# Patient Record
Sex: Female | Born: 1940 | Race: White | Hispanic: No | Marital: Married | Smoking: Former smoker
Health system: Southern US, Community
[De-identification: ages and names within clinical notes are randomized; demographics above are authoritative.]

## PROBLEM LIST (undated history)

## (undated) DIAGNOSIS — J45909 Unspecified asthma, uncomplicated: Secondary | ICD-10-CM

## (undated) DIAGNOSIS — J439 Emphysema, unspecified: Secondary | ICD-10-CM

## (undated) DIAGNOSIS — I4891 Unspecified atrial fibrillation: Secondary | ICD-10-CM

## (undated) DIAGNOSIS — C3490 Malignant neoplasm of unspecified part of unspecified bronchus or lung: Secondary | ICD-10-CM

## (undated) HISTORY — PX: HERNIA REPAIR: SHX51

## (undated) HISTORY — PX: ABDOMINAL HYSTERECTOMY: SHX81

---

## 1997-04-05 HISTORY — PX: LUNG REMOVAL, PARTIAL: SHX233

## 2006-07-04 ENCOUNTER — Emergency Department (HOSPITAL_COMMUNITY): Admission: EM | Admit: 2006-07-04 | Discharge: 2006-07-04 | Payer: Self-pay | Admitting: Emergency Medicine

## 2009-12-16 ENCOUNTER — Emergency Department (HOSPITAL_COMMUNITY): Admission: EM | Admit: 2009-12-16 | Discharge: 2009-12-16 | Payer: Self-pay | Admitting: Emergency Medicine

## 2010-10-04 ENCOUNTER — Emergency Department (HOSPITAL_COMMUNITY): Payer: Medicare Other

## 2010-10-04 ENCOUNTER — Emergency Department (HOSPITAL_COMMUNITY)
Admission: EM | Admit: 2010-10-04 | Discharge: 2010-10-04 | Disposition: A | Discharge status: Home / Self Care | Payer: Medicare Other | Attending: Emergency Medicine | Admitting: Emergency Medicine

## 2010-10-04 DIAGNOSIS — E039 Hypothyroidism, unspecified: Secondary | ICD-10-CM | POA: Insufficient documentation

## 2010-10-04 DIAGNOSIS — R0602 Shortness of breath: Secondary | ICD-10-CM | POA: Insufficient documentation

## 2010-10-04 DIAGNOSIS — J449 Chronic obstructive pulmonary disease, unspecified: Secondary | ICD-10-CM | POA: Insufficient documentation

## 2010-10-04 DIAGNOSIS — Z79899 Other long term (current) drug therapy: Secondary | ICD-10-CM | POA: Insufficient documentation

## 2010-10-04 DIAGNOSIS — Z87891 Personal history of nicotine dependence: Secondary | ICD-10-CM | POA: Insufficient documentation

## 2010-10-04 DIAGNOSIS — J4489 Other specified chronic obstructive pulmonary disease: Secondary | ICD-10-CM | POA: Insufficient documentation

## 2010-10-04 DIAGNOSIS — Z9981 Dependence on supplemental oxygen: Secondary | ICD-10-CM | POA: Insufficient documentation

## 2010-10-04 LAB — CBC
HCT: 41.8 % (ref 36.0–46.0)
Hemoglobin: 13.3 g/dL (ref 12.0–15.0)
MCH: 30.3 pg (ref 26.0–34.0)
RBC: 4.39 MIL/uL (ref 3.87–5.11)

## 2010-10-04 LAB — DIFFERENTIAL
Basophils Absolute: 0 10*3/uL (ref 0.0–0.1)
Basophils Relative: 1 % (ref 0–1)
Lymphocytes Relative: 15 % (ref 12–46)
Monocytes Absolute: 0.3 10*3/uL (ref 0.1–1.0)
Monocytes Relative: 4 % (ref 3–12)
Neutro Abs: 4.9 10*3/uL (ref 1.7–7.7)
Neutrophils Relative %: 80 % — ABNORMAL HIGH (ref 43–77)

## 2010-10-04 LAB — COMPREHENSIVE METABOLIC PANEL
ALT: 11 U/L (ref 0–35)
Alkaline Phosphatase: 75 U/L (ref 39–117)
BUN: 12 mg/dL (ref 6–23)
CO2: 30 mEq/L (ref 19–32)
GFR calc Af Amer: >60 mL/min (ref >60)
GFR calc non Af Amer: >60 mL/min (ref >60)
Glucose, Bld: 128 mg/dL — ABNORMAL HIGH (ref 70–99)
Potassium: 4.5 mEq/L (ref 3.5–5.1)
Sodium: 138 mEq/L (ref 135–145)

## 2013-07-15 ENCOUNTER — Encounter (HOSPITAL_COMMUNITY): Payer: Self-pay | Admitting: Emergency Medicine

## 2013-07-15 ENCOUNTER — Emergency Department (HOSPITAL_COMMUNITY): Payer: Medicare Other

## 2013-07-15 ENCOUNTER — Emergency Department (HOSPITAL_COMMUNITY)
Admission: EM | Admit: 2013-07-15 | Discharge: 2013-07-15 | Disposition: A | Discharge status: Home / Self Care | Payer: Medicare Other | Attending: Emergency Medicine | Admitting: Emergency Medicine

## 2013-07-15 DIAGNOSIS — Z85118 Personal history of other malignant neoplasm of bronchus and lung: Secondary | ICD-10-CM | POA: Insufficient documentation

## 2013-07-15 DIAGNOSIS — Z91040 Latex allergy status: Secondary | ICD-10-CM | POA: Insufficient documentation

## 2013-07-15 DIAGNOSIS — R42 Dizziness and giddiness: Secondary | ICD-10-CM | POA: Insufficient documentation

## 2013-07-15 DIAGNOSIS — J441 Chronic obstructive pulmonary disease with (acute) exacerbation: Secondary | ICD-10-CM | POA: Insufficient documentation

## 2013-07-15 DIAGNOSIS — I4891 Unspecified atrial fibrillation: Secondary | ICD-10-CM | POA: Insufficient documentation

## 2013-07-15 DIAGNOSIS — R000 Tachycardia, unspecified: Secondary | ICD-10-CM | POA: Insufficient documentation

## 2013-07-15 DIAGNOSIS — J45901 Unspecified asthma with (acute) exacerbation: Secondary | ICD-10-CM | POA: Insufficient documentation

## 2013-07-15 DIAGNOSIS — Z880 Allergy status to penicillin: Secondary | ICD-10-CM | POA: Insufficient documentation

## 2013-07-15 DIAGNOSIS — Z87891 Personal history of nicotine dependence: Secondary | ICD-10-CM | POA: Insufficient documentation

## 2013-07-15 HISTORY — DX: Unspecified asthma, uncomplicated: J45.909

## 2013-07-15 HISTORY — DX: Unspecified atrial fibrillation: I48.91

## 2013-07-15 HISTORY — DX: Malignant neoplasm of unspecified part of unspecified bronchus or lung: C34.90

## 2013-07-15 HISTORY — DX: Emphysema, unspecified: J43.9

## 2013-07-15 LAB — BASIC METABOLIC PANEL
BUN: 18 mg/dL (ref 6–23)
CO2: 27 mEq/L (ref 19–32)
Calcium: 9.4 mg/dL (ref 8.4–10.5)
Chloride: 103 mEq/L (ref 96–112)
Creatinine, Ser: 1.15 mg/dL — ABNORMAL HIGH (ref 0.50–1.10)
GFR calc Af Amer: 54 mL/min — ABNORMAL LOW (ref >90)
GFR calc non Af Amer: 46 mL/min — ABNORMAL LOW (ref >90)
Glucose, Bld: 241 mg/dL — ABNORMAL HIGH (ref 70–99)
Potassium: 4.6 mEq/L (ref 3.7–5.3)
Sodium: 140 mEq/L (ref 137–147)

## 2013-07-15 LAB — TROPONIN I: Troponin I: <0.3 ng/mL (ref <0.30)

## 2013-07-15 LAB — CBC
HCT: 39.6 % (ref 36.0–46.0)
Hemoglobin: 12.9 g/dL (ref 12.0–15.0)
MCH: 30.9 pg (ref 26.0–34.0)
MCHC: 32.6 g/dL (ref 30.0–36.0)
MCV: 95 fL (ref 78.0–100.0)
Platelets: 178 10*3/uL (ref 150–400)
RBC: 4.17 MIL/uL (ref 3.87–5.11)
RDW: 14.1 % (ref 11.5–15.5)
WBC: 5.3 10*3/uL (ref 4.0–10.5)

## 2013-07-15 LAB — PRO B NATRIURETIC PEPTIDE: Pro B Natriuretic peptide (BNP): 344.5 pg/mL — ABNORMAL HIGH (ref 0–125)

## 2013-07-15 LAB — MAGNESIUM: Magnesium: 2 mg/dL (ref 1.5–2.5)

## 2013-07-15 MED ORDER — DILTIAZEM HCL 25 MG/5ML IV SOLN
20.0000 mg | Freq: Once | INTRAVENOUS | Status: AC
  Administered 2013-07-15: 20 mg via INTRAVENOUS

## 2013-07-15 MED ORDER — DILTIAZEM HCL 100 MG IV SOLR
5.0000 mg/h | Freq: Once | INTRAVENOUS | Status: AC
  Administered 2013-07-15: 5 mg/h via INTRAVENOUS
  Filled 2013-07-15: qty 100

## 2013-07-15 MED ORDER — DILTIAZEM HCL ER COATED BEADS 240 MG PO TB24: 240.0000 mg | ORAL_TABLET | Freq: Every day | ORAL | Status: AC

## 2013-07-15 NOTE — ED Provider Notes (Signed)
CSN: [id number]     Arrival date & time 07/15/13  1705 History  This chart was scribed for Tracy Manifold, MD by [name], ED Scribe. This patient was seen in room APA02/APA02 and the patient's care was started at 5:41 PM.   Chief Complaint  Patient presents with  . Chest Pain     The history is provided by the patient. No language interpreter was used.    HPI Comments: Tracy Bridges is a 73 y.o. female with a h/o A. Fib who presents to the Emergency Department complaining of sudden onset CP that started around 2 PM today while washing dishes. She describes the pain as a centrally located squeezing sensation with associated SOB and lightheadedness. She checked her HR at 160 at home and came to the ED. She reports one prior episode diagnosed as A. Fib after being given an epi injection after an allergic reaction to an antibiotic given through a PICC line early last month. She was started on Eliquis and Cardizem and denies having any symptoms since then until today. She reports that she was switched to Plavix for monetary reasons recently. She denies any missed doses of her daily medications.   Past Medical History  Diagnosis Date  . Asthma   . Emphysema/COPD   . Atrial fibrillation   . Lung cancer    Past Surgical History  Procedure Laterality Date  . Abdominal hysterectomy    . Cesarean section    . Hernia repair     No family history on file. History  Substance Use Topics  . Smoking status: Former Research scientist (life sciences)  . Smokeless tobacco: Not on file  . Alcohol Use: No   No OB history provided.  Review of Systems  Respiratory: Positive for shortness of breath.   Cardiovascular: Positive for chest pain.  Neurological: Positive for light-headedness.  All other systems reviewed and are negative.   Allergies  Aspirin; Atenolol; Boniva; Fosamax; Latex; Mucinex; Penicillins; Rocephin; Rotateq; Sulfa antibiotics; Tobramycin sulfate; and Vancomycin  Home Medications  No current  outpatient prescriptions on file.  Triage Vitals: BP 151/89  Pulse 150  Temp(Src) 98.1 F (36.7 C) (Oral)  Resp 22  SpO2 96%  Physical Exam  Nursing note and vitals reviewed. Constitutional: She is oriented to person, place, and time. She appears well-developed and well-nourished. No distress.  HENT:  Head: Normocephalic and atraumatic.  Eyes: EOM are normal.  Neck: Normal range of motion.  Cardiovascular: Normal heart sounds.  An irregularly irregular rhythm present. Tachycardia present.   Pulmonary/Chest: Effort normal and breath sounds normal.  Abdominal: Soft. She exhibits no distension. There is no tenderness.  Musculoskeletal: Normal range of motion.  Neurological: She is alert and oriented to person, place, and time.  Skin: Skin is warm and dry.  Psychiatric: She has a normal mood and affect. Judgment normal.    ED Course  Procedures (including critical care time)  Medications  diltiazem (CARDIZEM) 100 mg in dextrose 5 % 100 mL infusion (5 mg/hr Intravenous New Bag/Given 07/15/13 1802)  diltiazem (CARDIZEM) injection 20 mg (0 mg Intravenous Stopped 07/15/13 1818)    DIAGNOSTIC STUDIES: Oxygen Saturation is 96% on RA, adequate by my interpretation.    COORDINATION OF CARE: 5:47 PM-Addressed pt and husband's concerns about A. Fib to apparent satisfaction. Discussed treatment plan which includes Cardizem with BP monitoring along with CXR, BMP, CBC and troponin with pt at bedside and pt agreed to plan. Informed pt that admission may be necessary depending on her  reaction to treatments.   Labs Review Labs Reviewed  BASIC METABOLIC PANEL - Abnormal; Notable for the following:    Glucose, Bld 241 (*)    Creatinine, Ser 1.15 (*)    GFR calc non Af Amer 46 (*)    GFR calc Af Amer 54 (*)    All other components within normal limits  PRO B NATRIURETIC PEPTIDE - Abnormal; Notable for the following:    Pro B Natriuretic peptide (BNP) 344.5 (*)    All other components within  normal limits  CBC  TROPONIN I  MAGNESIUM   Imaging Review No results found.   EKG Interpretation   Date/Time:  Sunday July 15 2013 17:18:35 EDT Ventricular Rate:  123 PR Interval:    QRS Duration: 80 QT Interval:  302 QTC Calculation: 432 R Axis:   165 Text Interpretation:  Suspect arm lead reversal, interpretation  assumes no reversal Atrial fibrillation with rapid ventricular response  Lateral infarct , age undetermined Abnormal ECG When compared with ECG of  04-Oct-2010 09:05, Atrial fibrillation has replaced Sinus rhythm QRS axis  Shifted right Lateral infarct is now Present Nonspecific T wave  abnormality now evident in Inferior leads ED PHYSICIAN INTERPRETATION  AVAILABLE IN [facility] Confirmed by TEST, Record ([id number]) on  07/17/2013 7:56:00 AM      MDM   Final diagnoses:  Atrial fibrillation with rapid ventricular response   73 year old female with atrial fibrillation with rapid ventricular response. She has a history of the same. She is anticoagulated on eliquis. Her symptoms have resolved with rate control. She still remains in A. fib. Will make a small increase in her Cardizem temporarily until she can followup with her PCP or cardiologist to discuss further. Workup has been pretty unremarkable. Impression is safe for discharge at this time.  I personally preformed the services scribed in my presence. The recorded information has been reviewed is accurate. Tracy Manifold, MD.      Tracy Manifold, MD 07/19/13 [phone]

## 2013-07-15 NOTE — ED Notes (Signed)
Sudden onset bil chest pain under bil breast starting today around 1400 while washing dishes with sob/nausea/dizziness/weakness.

## 2013-07-15 NOTE — Discharge Instructions (Signed)
Atrial Fibrillation  Atrial fibrillation is a type of irregular heart rhythm (arrhythmia). During atrial fibrillation, the upper chambers of the heart (atria) quiver continuously in a chaotic pattern. This causes an irregular and often rapid heart rate.   Atrial fibrillation is the result of the heart becoming overloaded with disorganized signals that tell it to beat. These signals are normally released one at a time by a part of the right atrium called the sinoatrial node. They then travel from the atria to the lower chambers of the heart (ventricles), causing the atria and ventricles to contract and pump blood as they pass. In atrial fibrillation, parts of the atria outside of the sinoatrial node also release these signals. This results in two problems. First, the atria receive so many signals that they do not have time to fully contract. Second, the ventricles, which can only receive one signal at a time, beat irregularly and out of rhythm with the atria.   There are three types of atrial fibrillation:    Paroxysmal Paroxysmal atrial fibrillation starts suddenly and stops on its own within a week.    Persistent Persistent atrial fibrillation lasts for more than a week. It may stop on its own or with treatment.    Permanent Permanent atrial fibrillation does not go away. Episodes of atrial fibrillation may lead to permanent atrial fibrillation.   Atrial fibrillation can prevent your heart from pumping blood normally. It increases your risk of stroke and can lead to heart failure.   CAUSES    Heart conditions, including a heart attack, heart failure, coronary artery disease, and heart valve conditions.    Inflammation of the sac that surrounds the heart (pericarditis).    Blockage of an artery in the lungs (pulmonary embolism).    Pneumonia or other infections.    Chronic lung disease.    Thyroid problems, especially if the thyroid is overactive (hyperthyroidism).    Caffeine, excessive alcohol  use, and use of some illegal drugs.    Use of some medications, including certain decongestants and diet pills.    Heart surgery.    Birth defects.   Sometimes, no cause can be found. When this happens, the atrial fibrillation is called lone atrial fibrillation. The risk of complications from atrial fibrillation increases if you have lone atrial fibrillation and you are age 60 years or older.  RISK FACTORS   Heart failure.   Coronary artery disease   Diabetes mellitus.    High blood pressure (hypertension).    Obesity.    Other arrhythmias.    Increased age.  SYMPTOMS    A feeling that your heart is beating rapidly or irregularly.    A feeling of discomfort or pain in your chest.    Shortness of breath.    Sudden lightheadedness or weakness.    Getting tired easily when exercising.    Urinating more often than normal (mainly when atrial fibrillation first begins).   In paroxysmal atrial fibrillation, symptoms may start and suddenly stop.  DIAGNOSIS   Your caregiver may be able to detect atrial fibrillation when taking your pulse. Usually, testing is needed to diagnosis atrial fibrillation. Tests may include:    Electrocardiography. During this test, the electrical impulses of your heart are recorded while you are lying down.    Echocardiography. During echocardiography, sound waves are used to evaluate how blood flows through your heart.    Stress test. There is more than one type of stress test. If a stress test is   needed, ask your caregiver about which type is best for you.    Chest X-ray exam.    Blood tests.    Computed tomography (CT).   TREATMENT    Treating any underlying conditions. For example, if you have an overactive thyroid, treating the condition may correct atrial fibrillation.    Medication. Medications may be given to control a rapid heart rate or to prevent blood clots, heart failure, or a stroke.    Procedure to correct the rhythm of the  heart:   Electrical cardioversion. During electrical cardioversion, a controlled, low-energy shock is delivered to the heart through your skin. If you have chest pain, very low pressure blood pressure, or sudden heart failure, this procedure may need to be done as an emergency.   Catheter ablation. During this procedure, heart tissues that send the signals that cause atrial fibrillation are destroyed.   Maze or minimaze procedure. During this surgery, thin lines of heart tissue that carry the abnormal signals are destroyed. The maze procedure is an open-heart surgery. The minimaze procedure is a minimally invasive surgery. This means that small cuts are made to access the heart instead of a large opening.   Pulmonary venous isolation. During this surgery, tissue around the veins that carry blood from the lungs (pulmonary veins) is destroyed. This tissue is thought to carry the abnormal signals.  HOME CARE INSTRUCTIONS    Take medications as directed by your caregiver.   Only take medications that your caregiver approves. Some medications can make atrial fibrillation worse or recur.   If blood thinners were prescribed by your caregiver, take them exactly as directed. Too much can cause bleeding. Too little and you will not have the needed protection against stroke and other problems.   Perform blood tests at home if directed by your caregiver.   Perform blood tests exactly as directed.    Quit smoking if you smoke.    Do not drink alcohol.    Do not drink caffeinated beverages such as coffee, soda, and some teas. You may drink decaffeinated coffee, soda, or tea.    Maintain a healthy weight. Do not use diet pills unless your caregiver approves. They may make heart problems worse.    Follow diet instructions as directed by your caregiver.    Exercise regularly as directed by your caregiver.    Keep all follow-up appointments.  PREVENTION   The following substances can cause atrial fibrillation  to recur:    Caffeinated beverages.    Alcohol.    Certain medications, especially those used for breathing problems.    Certain herbs and herbal medications, such as those containing ephedra or ginseng.   Illegal drugs such as cocaine and amphetamines.  Sometimes medications are given to prevent atrial fibrillation from recurring. Proper treatment of any underlying condition is also important in helping prevent recurrence.   SEEK MEDICAL CARE IF:   You notice a change in the rate, rhythm, or strength of your heartbeat.    You suddenly begin urinating more frequently.    You tire more easily when exerting yourself or exercising.   SEEK IMMEDIATE MEDICAL CARE IF:    You develop chest pain, abdominal pain, sweating, or weakness.   You feel sick to your stomach (nauseous).   You develop shortness of breath.   You suddenly develop swollen feet and ankles.   You feel dizzy.   You face or limbs feel numb or weak.   There is a change in your   vision or speech.  MAKE SURE YOU:    Understand these instructions.   Will watch your condition.   Will get help right away if you are not doing well or get worse.  Document Released: 03/22/2005 Document Revised: 07/17/2012 Document Reviewed: 05/02/2012  [employer] Patient Information 2014 [employer].

## 2014-11-12 ENCOUNTER — Encounter: Payer: Self-pay | Admitting: Internal Medicine

## 2014-11-27 ENCOUNTER — Ambulatory Visit: Payer: Self-pay | Admitting: Nurse Practitioner

## 2014-12-03 ENCOUNTER — Encounter: Payer: Self-pay | Admitting: Nurse Practitioner

## 2014-12-03 ENCOUNTER — Ambulatory Visit (INDEPENDENT_AMBULATORY_CARE_PROVIDER_SITE_OTHER): Payer: Medicare Other | Admitting: Nurse Practitioner

## 2014-12-03 VITALS — BP 184/75 | HR 74 | Temp 98.1°F | Ht 66.0 in | Wt 201.4 lb

## 2014-12-03 DIAGNOSIS — K219 Gastro-esophageal reflux disease without esophagitis: Secondary | ICD-10-CM | POA: Diagnosis not present

## 2014-12-03 NOTE — Progress Notes (Signed)
Primary Care Physician:  Vicente Males, MD Primary Gastroenterologist:  Dr. Gala Romney  Chief Complaint  Patient presents with  . Abdominal Pain    HPI:   74 year old female presents on follow-up from PCP. PCP notes reviewed. Patient referred due to "reflux and gallbladder symptoms." No recent labs in our system. No endoscopic procedural notes found in our system. Today she states she has had GERD for a number of years. Takes two Prilosec a day. Wakes in the middle of the night choking on acid-like substance. This has worsened in the past few months. Denies abdominal pain. Has near constant nausea. Occasional vomiting, especially if she bends forward after eating. Denies hematochezia. Has a history of dysphagia, has had esophageal dilation "a long time ago" but currently only has symptoms about once every 3-4 months which is "when I knowingly eat the wrong thing." Thinks the last endoscopy was a few years ago was at [facility] or [place]. Has excessive belching. When she does have occasional abdominal pain it is typically epigastric. Eats a fair amount of fried foods. Has occasional scant toilet tissue hematochezia, denies overt hematochezia, denies melena. Has been on bid omeprazole for about 20 years. Denies NSIADs and ASA powders. Denies chest pain, dyspnea, dizziness, lightheadedness, syncope, near syncope. Denies any other upper or lower GI symptoms.    Past Medical History  Diagnosis Date  . Asthma   . Emphysema/COPD   . Atrial fibrillation   . Lung cancer     Past Surgical History  Procedure Laterality Date  . Abdominal hysterectomy    . Cesarean section    . Hernia repair      Current Outpatient Prescriptions  Medication Sig Dispense Refill  . albuterol (PROVENTIL HFA;VENTOLIN HFA) 108 (90 BASE) MCG/ACT inhaler Inhale 2 puffs into the lungs every 4 (four) hours as needed for wheezing or shortness of breath.    . Ascorbic Acid (VITAMIN C) 100 MG tablet Take 100 mg by mouth daily.      . clopidogrel (PLAVIX) 75 MG tablet Take 75 mg by mouth daily.    Marland Kitchen diltiazem (CARDIZEM CD) 180 MG 24 hr capsule Take 180 mg by mouth daily.    Marland Kitchen diltiazem (CARDIZEM LA) 240 MG 24 hr tablet Take 1 tablet (240 mg total) by mouth daily. 30 tablet 0  . EPINEPHrine (EPIPEN) 0.3 mg/0.3 mL SOAJ injection Inject 0.3 mg into the muscle once.    . furosemide (LASIX) 40 MG tablet Take 40 mg by mouth as needed for fluid.    Marland Kitchen HYDROcodone-homatropine (HYCODAN) 5-1.5 MG/5ML syrup Take 5 mLs by mouth at bedtime as needed for cough.    . levothyroxine (SYNTHROID, LEVOTHROID) 75 MCG tablet Take 75 mcg by mouth daily.    . Multiple Vitamin (MULTIVITAMIN) tablet Take 1 tablet by mouth daily.    Marland Kitchen omeprazole (PRILOSEC) 20 MG capsule Take 20 mg by mouth 2 (two) times daily.    . pravastatin (PRAVACHOL) 10 MG tablet Take 10 mg by mouth at bedtime.    . predniSONE (DELTASONE) 10 MG tablet Take 10 mg by mouth every other day.    . salmeterol (SEREVENT DISKUS) 50 MCG/DOSE diskus inhaler Inhale 1 puff into the lungs 2 (two) times daily.    . Vitamin D, Ergocalciferol, (DRISDOL) 50000 UNITS CAPS capsule Take 50,000 Units by mouth every 14 (fourteen) days.    . calcium carbonate (CALCIUM 600) 600 MG TABS tablet Take by mouth.     No current facility-administered medications for this visit.  Allergies as of 12/03/2014 - Review Complete 12/03/2014  Allergen Reaction Noted  . Aspirin  07/15/2013  . Atenolol  07/15/2013  . Boniva [ibandronic acid]  07/15/2013  . Fosamax [alendronate sodium]  07/15/2013  . Latex  07/15/2013  . Mucinex [guaifenesin er]  07/15/2013  . Penicillins  07/15/2013  . Rocephin [ceftriaxone sodium in dextrose]  07/15/2013  . Rotateq [rotavirus vaccine live oral]  07/15/2013  . Sulfa antibiotics  07/15/2013  . Tobramycin sulfate  07/15/2013  . Vancomycin  07/15/2013    No family history on file.  Social History   Social History  . Marital Status: Married    Spouse Name: N/A  .  Number of Children: N/A  . Years of Education: N/A   Occupational History  . Not on file.   Social History Main Topics  . Smoking status: Former Research scientist (life sciences)  . Smokeless tobacco: Not on file  . Alcohol Use: No  . Drug Use: No  . Sexual Activity: Not on file   Other Topics Concern  . Not on file   Social History Narrative    Review of Systems: General: Negative for anorexia, weight loss, fever, chills, fatigue, weakness. Eyes: Negative for vision changes.  ENT: Negative for hoarseness, difficulty swallowing. CV: Negative for chest pain, angina, palpitations, dyspnea on exertion, peripheral edema.  Respiratory: Negative for dyspnea at rest, dyspnea on exertion, cough, sputum, wheezing.  GI: See history of present illness. Derm: Negative for rash or itching.  Endo: Negative for unusual weight change.  Heme: Negative for bruising or bleeding. Allergy: Negative for rash or hives.    Physical Exam: BP 184/75 mmHg  Pulse 74  Temp(Src) 98.1 F (36.7 C)  Ht '5\' 6"'$  (1.676 m)  Wt 201 lb 6.4 oz (91.354 kg)  BMI 32.52 kg/m2 General:   Alert and oriented. Pleasant and cooperative. Well-nourished and well-developed.  Head:  Normocephalic and atraumatic. Eyes:  Without icterus, sclera clear and conjunctiva pink.  Ears:  Normal auditory acuity. Cardiovascular:  S1, S2 present without murmurs appreciated. Normal pulses noted. Extremities without clubbing or edema. Respiratory:  Clear to auscultation bilaterally. No wheezes, rales, or rhonchi. No distress.  Gastrointestinal:  +BS, soft, non-tender and non-distended. No HSM noted. No guarding or rebound. No masses appreciated.  Rectal:  Deferred  Neurologic:  Alert and oriented x4;  grossly normal neurologically. Psych:  Alert and cooperative. Normal mood and affect. Heme/Lymph/Immune: No excessive bruising noted.    12/03/2014 10:32 AM

## 2014-12-03 NOTE — Patient Instructions (Addendum)
1. Stop taking Prilosec. 2. Start taking Protonix 40 mg once a day. 3. Call our office in 4 weeks and notify us of how the Protonix is working. If it is not working we can start you on a different medication. 4. Avoid trigger foods that make her symptoms worse, such as fried/greasy foods, spicy foods, and others.  5. We will have he signed a release so we can request your endoscopy records from [place] and [place]. 6. Return for follow-up in 3 months.    Food Choices for Gastroesophageal Reflux Disease When you have gastroesophageal reflux disease (GERD), the foods you eat and your eating habits are very important. Choosing the right foods can help ease the discomfort of GERD. WHAT GENERAL GUIDELINES DO I NEED TO FOLLOW?  Choose fruits, vegetables, whole grains, low-fat dairy products, and low-fat meat, fish, and poultry.  Limit fats such as oils, salad dressings, butter, nuts, and avocado.  Keep a food diary to identify foods that cause symptoms.  Avoid foods that cause reflux. These may be different for different people.  Eat frequent small meals instead of three large meals each day.  Eat your meals slowly, in a relaxed setting.  Limit fried foods.  Cook foods using methods other than frying.  Avoid drinking alcohol.  Avoid drinking large amounts of liquids with your meals.  Avoid bending over or lying down until 2-3 hours after eating. WHAT FOODS ARE NOT RECOMMENDED? The following are some foods and drinks that may worsen your symptoms: Vegetables Tomatoes. Tomato juice. Tomato and spaghetti sauce. Chili peppers. Onion and garlic. Horseradish. Fruits Oranges, grapefruit, and lemon (fruit and juice). Meats High-fat meats, fish, and poultry. This includes hot dogs, ribs, ham, sausage, salami, and bacon. Dairy Whole milk and chocolate milk. Sour cream. Cream. Butter. Ice cream. Cream cheese.  Beverages Coffee and tea, with or without caffeine. Carbonated beverages or  energy drinks. Condiments Hot sauce. Barbecue sauce.  Sweets/Desserts Chocolate and cocoa. Donuts. Peppermint and spearmint. Fats and Oils High-fat foods, including [place] fries and potato chips. Other Vinegar. Strong spices, such as black pepper, white pepper, red pepper, cayenne, curry powder, cloves, ginger, and chili powder. The items listed above may not be a complete list of foods and beverages to avoid. Contact your dietitian for more information. Document Released: 03/22/2005 Document Revised: 03/27/2013 Document Reviewed: 01/24/2013 [facility] Patient Information 2015 [place], [place]. This information is not intended to replace advice given to you by your health care provider. Make sure you discuss any questions you have with your health care provider.

## 2014-12-04 ENCOUNTER — Other Ambulatory Visit: Payer: Self-pay | Admitting: Nurse Practitioner

## 2014-12-04 MED ORDER — PANTOPRAZOLE SODIUM 40 MG PO TBEC: 40.0000 mg | DELAYED_RELEASE_TABLET | Freq: Every day | ORAL | Status: DC

## 2014-12-05 NOTE — Assessment & Plan Note (Signed)
Patient with a history of GERD currently on Prilosec twice a day. We offered her an upper endoscopy to further evaluate her symptoms, however she is declined this at this time due to history of for results of anesthesia. Instead I will have her stop her Prilosec, start Protonix 40 mg daily to see if this improves her symptoms. Also advise she avoid trigger foods such as fried/greasy foods, spicy foods, and others. We will request her previous EGD results from [place] and [place]. Return for follow-up in 3 months. No red flag/warning signs or symptoms

## 2014-12-05 NOTE — Progress Notes (Signed)
CC'ED TO PCP 

## 2015-03-05 ENCOUNTER — Ambulatory Visit: Payer: Medicare Other | Admitting: Nurse Practitioner

## 2016-11-30 ENCOUNTER — Other Ambulatory Visit (HOSPITAL_COMMUNITY): Payer: Self-pay | Admitting: Neurology

## 2016-11-30 DIAGNOSIS — G959 Disease of spinal cord, unspecified: Secondary | ICD-10-CM

## 2016-11-30 DIAGNOSIS — G8190 Hemiplegia, unspecified affecting unspecified side: Secondary | ICD-10-CM

## 2016-12-03 ENCOUNTER — Ambulatory Visit (HOSPITAL_COMMUNITY)
Admission: RE | Admit: 2016-12-03 | Discharge: 2016-12-03 | Disposition: A | Discharge status: Home / Self Care | Payer: Medicare HMO | Source: Ambulatory Visit | Attending: Neurology | Admitting: Neurology

## 2016-12-03 ENCOUNTER — Encounter (HOSPITAL_COMMUNITY): Payer: Self-pay

## 2016-12-03 DIAGNOSIS — G8190 Hemiplegia, unspecified affecting unspecified side: Secondary | ICD-10-CM

## 2016-12-03 DIAGNOSIS — G959 Disease of spinal cord, unspecified: Secondary | ICD-10-CM

## 2016-12-23 ENCOUNTER — Encounter: Payer: Self-pay | Admitting: Neurology

## 2016-12-26 ENCOUNTER — Encounter (HOSPITAL_COMMUNITY): Payer: Self-pay

## 2016-12-26 ENCOUNTER — Emergency Department (HOSPITAL_COMMUNITY): Payer: Medicare HMO

## 2016-12-26 ENCOUNTER — Other Ambulatory Visit: Payer: Self-pay

## 2016-12-26 ENCOUNTER — Inpatient Hospital Stay (HOSPITAL_COMMUNITY)
Admission: EM | Admit: 2016-12-26 | Discharge: 2017-01-07 | DRG: 025 | Disposition: A | Discharge status: Rehabilitation Facility | Payer: Medicare HMO | Attending: Neurosurgery | Admitting: Neurosurgery

## 2016-12-26 DIAGNOSIS — R569 Unspecified convulsions: Secondary | ICD-10-CM | POA: Diagnosis present

## 2016-12-26 DIAGNOSIS — I824Z2 Acute embolism and thrombosis of unspecified deep veins of left distal lower extremity: Secondary | ICD-10-CM | POA: Diagnosis not present

## 2016-12-26 DIAGNOSIS — I82499 Acute embolism and thrombosis of other specified deep vein of unspecified lower extremity: Secondary | ICD-10-CM | POA: Diagnosis not present

## 2016-12-26 DIAGNOSIS — D72829 Elevated white blood cell count, unspecified: Secondary | ICD-10-CM | POA: Diagnosis present

## 2016-12-26 DIAGNOSIS — Z91040 Latex allergy status: Secondary | ICD-10-CM

## 2016-12-26 DIAGNOSIS — G441 Vascular headache, not elsewhere classified: Secondary | ICD-10-CM | POA: Diagnosis not present

## 2016-12-26 DIAGNOSIS — C7931 Secondary malignant neoplasm of brain: Secondary | ICD-10-CM | POA: Diagnosis present

## 2016-12-26 DIAGNOSIS — M79671 Pain in right foot: Secondary | ICD-10-CM | POA: Diagnosis not present

## 2016-12-26 DIAGNOSIS — Z902 Acquired absence of lung [part of]: Secondary | ICD-10-CM

## 2016-12-26 DIAGNOSIS — G8191 Hemiplegia, unspecified affecting right dominant side: Secondary | ICD-10-CM | POA: Diagnosis present

## 2016-12-26 DIAGNOSIS — Z882 Allergy status to sulfonamides status: Secondary | ICD-10-CM

## 2016-12-26 DIAGNOSIS — R9431 Abnormal electrocardiogram [ECG] [EKG]: Secondary | ICD-10-CM | POA: Diagnosis present

## 2016-12-26 DIAGNOSIS — I469 Cardiac arrest, cause unspecified: Secondary | ICD-10-CM | POA: Diagnosis not present

## 2016-12-26 DIAGNOSIS — R531 Weakness: Secondary | ICD-10-CM | POA: Insufficient documentation

## 2016-12-26 DIAGNOSIS — R112 Nausea with vomiting, unspecified: Secondary | ICD-10-CM | POA: Diagnosis not present

## 2016-12-26 DIAGNOSIS — G936 Cerebral edema: Secondary | ICD-10-CM | POA: Diagnosis present

## 2016-12-26 DIAGNOSIS — Z79899 Other long term (current) drug therapy: Secondary | ICD-10-CM

## 2016-12-26 DIAGNOSIS — K219 Gastro-esophageal reflux disease without esophagitis: Secondary | ICD-10-CM | POA: Diagnosis present

## 2016-12-26 DIAGNOSIS — I4891 Unspecified atrial fibrillation: Secondary | ICD-10-CM | POA: Diagnosis not present

## 2016-12-26 DIAGNOSIS — M21371 Foot drop, right foot: Secondary | ICD-10-CM | POA: Diagnosis not present

## 2016-12-26 DIAGNOSIS — J479 Bronchiectasis, uncomplicated: Secondary | ICD-10-CM | POA: Diagnosis present

## 2016-12-26 DIAGNOSIS — Z87891 Personal history of nicotine dependence: Secondary | ICD-10-CM

## 2016-12-26 DIAGNOSIS — G4089 Other seizures: Secondary | ICD-10-CM | POA: Diagnosis present

## 2016-12-26 DIAGNOSIS — R258 Other abnormal involuntary movements: Secondary | ICD-10-CM | POA: Diagnosis present

## 2016-12-26 DIAGNOSIS — Z85118 Personal history of other malignant neoplasm of bronchus and lung: Secondary | ICD-10-CM | POA: Diagnosis not present

## 2016-12-26 DIAGNOSIS — Z880 Allergy status to penicillin: Secondary | ICD-10-CM

## 2016-12-26 DIAGNOSIS — Z7901 Long term (current) use of anticoagulants: Secondary | ICD-10-CM

## 2016-12-26 DIAGNOSIS — F40240 Claustrophobia: Secondary | ICD-10-CM | POA: Diagnosis present

## 2016-12-26 DIAGNOSIS — R7989 Other specified abnormal findings of blood chemistry: Secondary | ICD-10-CM | POA: Diagnosis not present

## 2016-12-26 DIAGNOSIS — Z888 Allergy status to other drugs, medicaments and biological substances status: Secondary | ICD-10-CM | POA: Diagnosis not present

## 2016-12-26 DIAGNOSIS — Z9981 Dependence on supplemental oxygen: Secondary | ICD-10-CM | POA: Diagnosis not present

## 2016-12-26 DIAGNOSIS — J438 Other emphysema: Secondary | ICD-10-CM | POA: Diagnosis not present

## 2016-12-26 DIAGNOSIS — J439 Emphysema, unspecified: Secondary | ICD-10-CM | POA: Diagnosis present

## 2016-12-26 DIAGNOSIS — I82402 Acute embolism and thrombosis of unspecified deep veins of left lower extremity: Secondary | ICD-10-CM | POA: Diagnosis not present

## 2016-12-26 DIAGNOSIS — D496 Neoplasm of unspecified behavior of brain: Secondary | ICD-10-CM | POA: Diagnosis present

## 2016-12-26 DIAGNOSIS — J449 Chronic obstructive pulmonary disease, unspecified: Secondary | ICD-10-CM | POA: Diagnosis not present

## 2016-12-26 DIAGNOSIS — R4701 Aphasia: Secondary | ICD-10-CM | POA: Diagnosis not present

## 2016-12-26 DIAGNOSIS — D62 Acute posthemorrhagic anemia: Secondary | ICD-10-CM | POA: Diagnosis not present

## 2016-12-26 DIAGNOSIS — Z886 Allergy status to analgesic agent status: Secondary | ICD-10-CM

## 2016-12-26 DIAGNOSIS — I480 Paroxysmal atrial fibrillation: Secondary | ICD-10-CM | POA: Diagnosis present

## 2016-12-26 DIAGNOSIS — G4700 Insomnia, unspecified: Secondary | ICD-10-CM | POA: Diagnosis not present

## 2016-12-26 DIAGNOSIS — M21379 Foot drop, unspecified foot: Secondary | ICD-10-CM | POA: Diagnosis present

## 2016-12-26 DIAGNOSIS — G939 Disorder of brain, unspecified: Secondary | ICD-10-CM | POA: Diagnosis not present

## 2016-12-26 DIAGNOSIS — D696 Thrombocytopenia, unspecified: Secondary | ICD-10-CM | POA: Diagnosis not present

## 2016-12-26 DIAGNOSIS — Z7902 Long term (current) use of antithrombotics/antiplatelets: Secondary | ICD-10-CM

## 2016-12-26 DIAGNOSIS — R609 Edema, unspecified: Secondary | ICD-10-CM | POA: Diagnosis not present

## 2016-12-26 DIAGNOSIS — G9389 Other specified disorders of brain: Secondary | ICD-10-CM

## 2016-12-26 DIAGNOSIS — Z7951 Long term (current) use of inhaled steroids: Secondary | ICD-10-CM

## 2016-12-26 DIAGNOSIS — Z881 Allergy status to other antibiotic agents status: Secondary | ICD-10-CM

## 2016-12-26 DIAGNOSIS — Z887 Allergy status to serum and vaccine status: Secondary | ICD-10-CM

## 2016-12-26 DIAGNOSIS — N182 Chronic kidney disease, stage 2 (mild): Secondary | ICD-10-CM | POA: Diagnosis present

## 2016-12-26 DIAGNOSIS — Z7952 Long term (current) use of systemic steroids: Secondary | ICD-10-CM

## 2016-12-26 DIAGNOSIS — K5900 Constipation, unspecified: Secondary | ICD-10-CM | POA: Diagnosis not present

## 2016-12-26 DIAGNOSIS — R202 Paresthesia of skin: Secondary | ICD-10-CM | POA: Diagnosis present

## 2016-12-26 DIAGNOSIS — E876 Hypokalemia: Secondary | ICD-10-CM | POA: Diagnosis not present

## 2016-12-26 DIAGNOSIS — I482 Chronic atrial fibrillation: Secondary | ICD-10-CM | POA: Diagnosis not present

## 2016-12-26 DIAGNOSIS — I481 Persistent atrial fibrillation: Secondary | ICD-10-CM | POA: Diagnosis not present

## 2016-12-26 LAB — CBC
HCT: 40.9 % (ref 36.0–46.0)
HEMOGLOBIN: 12.8 g/dL (ref 12.0–15.0)
MCH: 29.6 pg (ref 26.0–34.0)
MCHC: 31.3 g/dL (ref 30.0–36.0)
MCV: 94.5 fL (ref 78.0–100.0)
Platelets: 156 10*3/uL (ref 150–400)
RBC: 4.33 MIL/uL (ref 3.87–5.11)
RDW: 14.2 % (ref 11.5–15.5)
WBC: 10.1 10*3/uL (ref 4.0–10.5)

## 2016-12-26 LAB — PROTIME-INR
INR: 0.94
PROTHROMBIN TIME: 12.5 s (ref 11.4–15.2)

## 2016-12-26 LAB — COMPREHENSIVE METABOLIC PANEL
ALT: 14 U/L (ref 14–54)
ANION GAP: 9 (ref 5–15)
AST: 24 U/L (ref 15–41)
Albumin: 4 g/dL (ref 3.5–5.0)
Alkaline Phosphatase: 63 U/L (ref 38–126)
BUN: 11 mg/dL (ref 6–20)
CALCIUM: 9.7 mg/dL (ref 8.9–10.3)
CO2: 25 mmol/L (ref 22–32)
Chloride: 108 mmol/L (ref 101–111)
Creatinine, Ser: 1.05 mg/dL — ABNORMAL HIGH (ref 0.44–1.00)
GFR calc non Af Amer: 50 mL/min — ABNORMAL LOW (ref >60)
GFR, EST AFRICAN AMERICAN: 58 mL/min — AB (ref >60)
Glucose, Bld: 126 mg/dL — ABNORMAL HIGH (ref 65–99)
POTASSIUM: 3.9 mmol/L (ref 3.5–5.1)
Sodium: 142 mmol/L (ref 135–145)
Total Bilirubin: 0.6 mg/dL (ref 0.3–1.2)
Total Protein: 7 g/dL (ref 6.5–8.1)

## 2016-12-26 LAB — DIFFERENTIAL
Basophils Absolute: 0 10*3/uL (ref 0.0–0.1)
Basophils Relative: 0 %
Eosinophils Absolute: 0 10*3/uL (ref 0.0–0.7)
Eosinophils Relative: 0 %
LYMPHS PCT: 5 %
Lymphs Abs: 0.6 10*3/uL — ABNORMAL LOW (ref 0.7–4.0)
Monocytes Absolute: 0.1 10*3/uL (ref 0.1–1.0)
Monocytes Relative: 1 %
NEUTROS PCT: 93 %
Neutro Abs: 9.4 10*3/uL — ABNORMAL HIGH (ref 1.7–7.7)

## 2016-12-26 LAB — I-STAT CHEM 8, ED
BUN: 15 mg/dL (ref 6–20)
CALCIUM ION: 1.21 mmol/L (ref 1.15–1.40)
Chloride: 105 mmol/L (ref 101–111)
Creatinine, Ser: 1.1 mg/dL — ABNORMAL HIGH (ref 0.44–1.00)
Glucose, Bld: 123 mg/dL — ABNORMAL HIGH (ref 65–99)
HCT: 40 % (ref 36.0–46.0)
Hemoglobin: 13.6 g/dL (ref 12.0–15.0)
Potassium: 3.9 mmol/L (ref 3.5–5.1)
SODIUM: 143 mmol/L (ref 135–145)
TCO2: 28 mmol/L (ref 22–32)

## 2016-12-26 LAB — I-STAT TROPONIN, ED: Troponin i, poc: 0 ng/mL (ref 0.00–0.08)

## 2016-12-26 LAB — APTT: aPTT: 25 seconds (ref 24–36)

## 2016-12-26 MED ORDER — CALCIUM CARBONATE-VITAMIN D 500-200 MG-UNIT PO TABS
1.0000 | ORAL_TABLET | Freq: Every day | ORAL | Status: DC
  Administered 2016-12-26 – 2017-01-06 (×12): 1 via ORAL
  Filled 2016-12-26 (×12): qty 1

## 2016-12-26 MED ORDER — PANTOPRAZOLE SODIUM 40 MG PO TBEC
40.0000 mg | DELAYED_RELEASE_TABLET | Freq: Two times a day (BID) | ORAL | Status: DC
  Administered 2016-12-27 – 2017-01-07 (×21): 40 mg via ORAL
  Filled 2016-12-26 (×22): qty 1

## 2016-12-26 MED ORDER — ADULT MULTIVITAMIN W/MINERALS CH
1.0000 | ORAL_TABLET | Freq: Every day | ORAL | Status: DC
  Administered 2016-12-27 – 2017-01-07 (×10): 1 via ORAL
  Filled 2016-12-26 (×11): qty 1

## 2016-12-26 MED ORDER — ONDANSETRON HCL 4 MG/2ML IJ SOLN
4.0000 mg | Freq: Four times a day (QID) | INTRAMUSCULAR | Status: DC | PRN
  Administered 2016-12-30 – 2017-01-07 (×9): 4 mg via INTRAVENOUS
  Filled 2016-12-26 (×8): qty 2

## 2016-12-26 MED ORDER — ACETAMINOPHEN 650 MG RE SUPP: 650.0000 mg | Freq: Four times a day (QID) | RECTAL | Status: DC | PRN

## 2016-12-26 MED ORDER — GADOBENATE DIMEGLUMINE 529 MG/ML IV SOLN
20.0000 mL | Freq: Once | INTRAVENOUS | Status: AC | PRN
  Administered 2016-12-26: 20 mL via INTRAVENOUS

## 2016-12-26 MED ORDER — ARFORMOTEROL TARTRATE 15 MCG/2ML IN NEBU
15.0000 ug | INHALATION_SOLUTION | Freq: Two times a day (BID) | RESPIRATORY_TRACT | Status: DC
  Administered 2016-12-27 – 2017-01-07 (×20): 15 ug via RESPIRATORY_TRACT
  Filled 2016-12-26 (×29): qty 2

## 2016-12-26 MED ORDER — HYDROCODONE-ACETAMINOPHEN 5-325 MG PO TABS
1.0000 | ORAL_TABLET | ORAL | Status: DC | PRN
  Administered 2017-01-02: 1 via ORAL
  Administered 2017-01-02 – 2017-01-03 (×2): 2 via ORAL
  Administered 2017-01-04 – 2017-01-05 (×4): 1 via ORAL
  Administered 2017-01-05: 2 via ORAL
  Administered 2017-01-06: 1 via ORAL
  Administered 2017-01-07: 2 via ORAL
  Filled 2016-12-26 (×2): qty 1
  Filled 2016-12-26: qty 2
  Filled 2016-12-26: qty 1
  Filled 2016-12-26 (×2): qty 2
  Filled 2016-12-26: qty 1
  Filled 2016-12-26: qty 2
  Filled 2016-12-26 (×2): qty 1

## 2016-12-26 MED ORDER — SODIUM CHLORIDE 0.9 % IV SOLN
1000.0000 mg | Freq: Once | INTRAVENOUS | Status: AC
  Administered 2016-12-26: 1000 mg via INTRAVENOUS
  Filled 2016-12-26: qty 10

## 2016-12-26 MED ORDER — SENNOSIDES-DOCUSATE SODIUM 8.6-50 MG PO TABS: 1.0000 | ORAL_TABLET | Freq: Every evening | ORAL | Status: DC | PRN

## 2016-12-26 MED ORDER — LORAZEPAM 2 MG/ML IJ SOLN
0.5000 mg | Freq: Once | INTRAMUSCULAR | Status: AC
  Administered 2016-12-26: 0.5 mg via INTRAVENOUS
  Filled 2016-12-26: qty 1

## 2016-12-26 MED ORDER — DEXAMETHASONE SODIUM PHOSPHATE 4 MG/ML IJ SOLN
4.0000 mg | Freq: Three times a day (TID) | INTRAMUSCULAR | Status: DC
  Administered 2016-12-26 – 2016-12-27 (×3): 4 mg via INTRAMUSCULAR
  Filled 2016-12-26 (×3): qty 1

## 2016-12-26 MED ORDER — ONDANSETRON HCL 4 MG PO TABS
4.0000 mg | ORAL_TABLET | Freq: Four times a day (QID) | ORAL | Status: DC | PRN
  Administered 2016-12-31 – 2017-01-06 (×3): 4 mg via ORAL
  Filled 2016-12-26 (×4): qty 1

## 2016-12-26 MED ORDER — BISACODYL 5 MG PO TBEC: 5.0000 mg | DELAYED_RELEASE_TABLET | Freq: Every day | ORAL | Status: DC | PRN

## 2016-12-26 MED ORDER — DEXAMETHASONE SODIUM PHOSPHATE 10 MG/ML IJ SOLN
10.0000 mg | Freq: Once | INTRAMUSCULAR | Status: AC
  Administered 2016-12-26: 10 mg via INTRAVENOUS
  Filled 2016-12-26: qty 1

## 2016-12-26 MED ORDER — LEVETIRACETAM 500 MG PO TABS
500.0000 mg | ORAL_TABLET | Freq: Two times a day (BID) | ORAL | Status: DC
  Administered 2016-12-27 – 2017-01-07 (×23): 500 mg via ORAL
  Filled 2016-12-26 (×23): qty 1

## 2016-12-26 MED ORDER — IPRATROPIUM-ALBUTEROL 0.5-2.5 (3) MG/3ML IN SOLN
3.0000 mL | Freq: Three times a day (TID) | RESPIRATORY_TRACT | Status: DC
  Administered 2016-12-26: 3 mL via RESPIRATORY_TRACT
  Filled 2016-12-26: qty 3

## 2016-12-26 MED ORDER — ALBUTEROL SULFATE (2.5 MG/3ML) 0.083% IN NEBU: 3.0000 mL | INHALATION_SOLUTION | RESPIRATORY_TRACT | Status: DC | PRN

## 2016-12-26 MED ORDER — ACETAMINOPHEN 325 MG PO TABS
650.0000 mg | ORAL_TABLET | Freq: Four times a day (QID) | ORAL | Status: DC | PRN
Start: 2016-12-26 — End: 2016-12-30
  Administered 2016-12-26: 650 mg via ORAL
  Filled 2016-12-26: qty 2

## 2016-12-26 MED ORDER — HEPARIN SODIUM (PORCINE) 5000 UNIT/ML IJ SOLN
5000.0000 [IU] | Freq: Three times a day (TID) | INTRAMUSCULAR | Status: DC
  Administered 2016-12-26 – 2016-12-27 (×3): 5000 [IU] via SUBCUTANEOUS
  Filled 2016-12-26 (×3): qty 1

## 2016-12-26 MED ORDER — HYDROCODONE-HOMATROPINE 5-1.5 MG/5ML PO SYRP: 5.0000 mL | ORAL_SOLUTION | Freq: Every evening | ORAL | Status: DC | PRN

## 2016-12-26 MED ORDER — DILTIAZEM HCL ER COATED BEADS 240 MG PO TB24
240.0000 mg | ORAL_TABLET | Freq: Every day | ORAL | Status: DC
  Administered 2016-12-26 – 2016-12-29 (×4): 240 mg via ORAL
  Filled 2016-12-26 (×8): qty 1

## 2016-12-26 MED ORDER — LORAZEPAM 2 MG/ML IJ SOLN: 1.0000 mg | Freq: Four times a day (QID) | INTRAMUSCULAR | Status: DC | PRN

## 2016-12-26 NOTE — ED Notes (Signed)
Dr ray has called she wants this pt back next

## 2016-12-26 NOTE — Consult Note (Signed)
Reason for Consult:brain tumor, right side weakness, decreased sensation Referring Physician: ED  Tracy Bridges is an 76 y.o. female.  HPI: whom has had strange and diminished sensation on her right side especially her legs. She has noticed weakness on her right side for at least 6 weeks. She has seen multiple physicians without an answer so her husband brought her to the ed today. She has not had seizures, or change in mentation during this time. Head CT revealed a mass in the posterior left frontal lobe with surrounding edema.  Past Medical History:  Diagnosis Date  . Asthma   . Atrial fibrillation ([place])   . Emphysema/COPD ([place])   . Lung cancer [facility])     Past Surgical History:  Procedure Laterality Date  . ABDOMINAL HYSTERECTOMY    . CESAREAN SECTION    . HERNIA REPAIR      No family history on file.  Social History:  reports that she has quit smoking. She does not have any smokeless tobacco history on file. She reports that she does not drink alcohol or use drugs.  Allergies:  Allergies  Allergen Reactions  . Aspirin Shortness Of Breath  . Atenolol Swelling    Throat swelled and closed up  . Boniva [Ibandronic Acid] Other (See Comments)    Pt does not remember specific reaction  . Fosamax [Alendronate Sodium] Other (See Comments)    Pt does not remember specific reaction  . Latex Swelling and Rash    Throat swelled and closed up  . Mucinex [Guaifenesin Er] Anaphylaxis  . Rocephin [Ceftriaxone Sodium In Dextrose] Anaphylaxis and Rash  . Rotateq [Rotavirus Vaccine Live Oral] Other (See Comments)    "poorly tolerated"  . Sulfa Antibiotics Other (See Comments)    Pt does not remember specific reaction  . Vancomycin Other (See Comments)    Pt does not remember specific reaction  . Penicillins Rash    Childhood reaction Has patient had a PCN reaction causing immediate rash, facial/tongue/throat swelling, SOB or lightheadedness with hypotension: Yes Has patient had a PCN  reaction causing severe rash involving mucus membranes or skin necrosis: No Has patient had a PCN reaction that required hospitalization: Unknown Has patient had a PCN reaction occurring within the last 10 years: No If all of the above answers are "NO", then may proceed with Cephalosporin use.  . Tobramycin Sulfate Rash    Medications: I have reviewed the patient's current medications.  Results for orders placed or performed during the hospital encounter of 12/26/16 (from the past 48 hour(s))  Protime-INR     Status: None   Collection Time: 12/26/16  2:13 PM  Result Value Ref Range   Prothrombin Time 12.5 11.4 - 15.2 seconds   INR 0.94   APTT     Status: None   Collection Time: 12/26/16  2:13 PM  Result Value Ref Range   aPTT 25 24 - 36 seconds  CBC     Status: None   Collection Time: 12/26/16  2:13 PM  Result Value Ref Range   WBC 10.1 4.0 - 10.5 K/uL   RBC 4.33 3.87 - 5.11 MIL/uL   Hemoglobin 12.8 12.0 - 15.0 g/dL   HCT 40.9 36.0 - 46.0 %   MCV 94.5 78.0 - 100.0 fL   MCH 29.6 26.0 - 34.0 pg   MCHC 31.3 30.0 - 36.0 g/dL   RDW 14.2 11.5 - 15.5 %   Platelets 156 150 - 400 K/uL  Differential  Status: Abnormal   Collection Time: 12/26/16  2:13 PM  Result Value Ref Range   Neutrophils Relative % 93 %   Neutro Abs 9.4 (H) 1.7 - 7.7 K/uL   Lymphocytes Relative 5 %   Lymphs Abs 0.6 (L) 0.7 - 4.0 K/uL   Monocytes Relative 1 %   Monocytes Absolute 0.1 0.1 - 1.0 K/uL   Eosinophils Relative 0 %   Eosinophils Absolute 0.0 0.0 - 0.7 K/uL   Basophils Relative 0 %   Basophils Absolute 0.0 0.0 - 0.1 K/uL  Comprehensive metabolic panel     Status: Abnormal   Collection Time: 12/26/16  2:13 PM  Result Value Ref Range   Sodium 142 135 - 145 mmol/L   Potassium 3.9 3.5 - 5.1 mmol/L   Chloride 108 101 - 111 mmol/L   CO2 25 22 - 32 mmol/L   Glucose, Bld 126 (H) 65 - 99 mg/dL   BUN 11 6 - 20 mg/dL   Creatinine, Ser 1.05 (H) 0.44 - 1.00 mg/dL   Calcium 9.7 8.9 - 10.3 mg/dL   Total  Protein 7.0 6.5 - 8.1 g/dL   Albumin 4.0 3.5 - 5.0 g/dL   AST 24 15 - 41 U/L   ALT 14 14 - 54 U/L   Alkaline Phosphatase 63 38 - 126 U/L   Total Bilirubin 0.6 0.3 - 1.2 mg/dL   GFR calc non Af Amer 50 (L) >60 mL/min   GFR calc Af Amer 58 (L) >60 mL/min    Comment: (NOTE) The eGFR has been calculated using the CKD EPI equation. This calculation has not been validated in all clinical situations. eGFR's persistently <60 mL/min signify possible Chronic Kidney Disease.    Anion gap 9 5 - 15  I-stat troponin, ED     Status: None   Collection Time: 12/26/16  2:32 PM  Result Value Ref Range   Troponin i, poc 0.00 0.00 - 0.08 ng/mL   Comment 3            Comment: Due to the release kinetics of cTnI, a negative result within the first hours of the onset of symptoms does not rule out myocardial infarction with certainty. If myocardial infarction is still suspected, repeat the test at appropriate intervals.   I-Stat Chem 8, ED     Status: Abnormal   Collection Time: 12/26/16  2:34 PM  Result Value Ref Range   Sodium 143 135 - 145 mmol/L   Potassium 3.9 3.5 - 5.1 mmol/L   Chloride 105 101 - 111 mmol/L   BUN 15 6 - 20 mg/dL   Creatinine, Ser 1.10 (H) 0.44 - 1.00 mg/dL   Glucose, Bld 123 (H) 65 - 99 mg/dL   Calcium, Ion 1.21 1.15 - 1.40 mmol/L   TCO2 28 22 - 32 mmol/L   Hemoglobin 13.6 12.0 - 15.0 g/dL   HCT 40.0 36.0 - 46.0 %    Ct Head Wo Contrast  Result Date: 12/26/2016 CLINICAL DATA:  RIGHT-sided weakness for several weeks EXAM: CT HEAD WITHOUT CONTRAST TECHNIQUE: Contiguous axial images were obtained from the base of the skull through the vertex COMPARISON:  None. FINDINGS: Brain: High-density ovoid mass in the high LEFT cerebral hemisphere / frontal lobe measures 3.7 x 2.8 by 3.1 cm (volume = 17 cm^3). There is vasogenic edema surrounding this ovoid mass. No extra-axial fluid collections. No intraventricular hemorrhage. No midline shift or mass effect. Basal cisterns are patent.  Vascular: No hyperdense vessel or unexpected calcification. Skull: Normal.  Negative for fracture or focal lesion. Sinuses/Orbits: Paranasal sinuses and mastoid air cells are clear. Orbits are clear. Other: None. IMPRESSION: 1. Large ovoid mass in the high LEFT frontal lobe with surrounding vasogenic edema is favored an intra-axial neoplasm. Parenchymal hematoma or meningioma are less favored. Recommend brain MRI with contrast for further evaluation. 2. No extra-axial fluid collections. 3. No worrisome mass effect. Findings conveyed toDANIELLE Bridges on 12/26/2016  at15:40. Electronically Signed   By: Suzy Bouchard M.D.   On: 12/26/2016 15:41   Mr Brain W And Wo Contrast  Result Date: 12/26/2016 CLINICAL DATA:  Seizure. Right-sided weakness. On Eliquis for atrial fibrillation. History of lung cancer 19 years ago. EXAM: MRI HEAD WITHOUT AND WITH CONTRAST TECHNIQUE: Multiplanar, multiecho pulse sequences of the brain and surrounding structures were obtained without and with intravenous contrast. CONTRAST:  20 mL MultiHance IV COMPARISON:  CT head 12/26/2016 FINDINGS: Brain: Image quality degraded by motion. Enhancing mass lesion in the high left frontal parietal lobe measuring 3.8 x 2.8 x 4.2 cm. The mass is hyperdense on CT with a mild amount of susceptibility on MRI. This could be calcification or hemorrhage. The mass is well-circumscribed and solid. Mild surrounding vasogenic edema. The mass extends to the dural surface. It is difficult determine if this is intra-axial or extra-axial. No other mass lesion. Atrophy and chronic white matter ischemia. 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 No acute infarct. Ventricle size normal. No shift of the midline structures. Vascular: Normal arterial flow void Skull and upper cervical spine: Negative Sinuses/Orbits: Bilateral cataract removal. Paranasal sinuses clear. Other: None IMPRESSION: Image quality degraded by motion. Solitary mass in the left frontal parietal  convexity with surrounding edema. It is difficult determine if this is intra-axial or extra-axial however the mass does extend to the dural surface. Possible meningioma. Metastatic disease possible. Atrophy and chronic microvascular ischemic change. No acute infarct. Electronically Signed   By: Franchot Gallo M.D.   On: 12/26/2016 19:03    ROS Blood pressure (!) 163/68, pulse 92, resp. rate 16, SpO2 95 %. Physical Exam  Constitutional: She is oriented to person, place, and time. She appears well-developed and well-nourished. She appears distressed.  HENT:  Head: Normocephalic and atraumatic.  Right Ear: External ear normal.  Left Ear: External ear normal.  Nose: Nose normal.  Mouth/Throat: Oropharynx is clear and moist.  Eyes: Pupils are equal, round, and reactive to light. Conjunctivae and EOM are normal.  Neck: Normal range of motion. Neck supple.  Cardiovascular: Intact distal pulses.   Respiratory: Effort normal and breath sounds normal.  Musculoskeletal: Normal range of motion.  Neurological: She is alert and oriented to person, place, and time. A sensory deficit is present. No cranial nerve deficit.  Weakness right side, dorsiflexors 3/5, quads, hams 4-/5 Right intrinsics 4-/5, grip 4-/5, triceps 4-/5, biceps 4-/5 Left side upper and lower extremities 5/5 Decreased proprioception right lower extremity +right pronator drift  Skin: She is not diaphoretic.    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 Will need resection. The eliquis must be discontinued. Will follow, will have much better idea about scheduling tomorrow. Will need decadron 89m tid, and Keppra 5052mpo bid.   Tracy Bridges L 12/26/2016, 7:26 PM

## 2016-12-26 NOTE — ED Provider Notes (Signed)
[facility] DEPT Provider Note   CSN: [id number] Arrival date & time: 12/26/16  1355     History   Chief Complaint Chief Complaint  Patient presents with  . Stroke Symptoms    HPI Tracy Bridges is a 76 y.o. female.  The history is provided by the patient.  Weakness  Primary symptoms include focal weakness. This is a new problem. The current episode started more than 1 week ago. The problem has been gradually worsening. There was right lower extremity and right upper extremity focality noted. There has been no fever. Associated symptoms include headaches. Pertinent negatives include no chest pain, no vomiting, no altered mental status and no confusion.    76 year old female who presents with right sided weakness. Symptoms gradually worsening over 6 weeks. Associated with daily headaches and nausea, but no vomiting. Having difficulty walking, dragging her right foot. Seen by PCP and referred to Dr. Merlene Laughter who requested MRI. Patient unable to tolerate MRI and has not had it rescheduled. She reports today, at 10 AM, she had 10 minutes of uncontrolled jerking of the right arm. No LOC. No confusion, vision or speech changes. History of atrial fibrillation on Eliquis  Past Medical History:  Diagnosis Date  . Asthma   . Atrial fibrillation ([place])   . Emphysema/COPD ([place])   . Lung cancer [facility])     Patient Active Problem List   Diagnosis Date Noted  . GERD (gastroesophageal reflux disease) 12/03/2014    Past Surgical History:  Procedure Laterality Date  . ABDOMINAL HYSTERECTOMY    . CESAREAN SECTION    . HERNIA REPAIR      OB History    No data available       Home Medications    Prior to Admission medications   Medication Sig Start Date End Date Taking? Authorizing Provider  acetaminophen (TYLENOL) 500 MG tablet Take 1,000 mg by mouth every 6 (six) hours as needed for headache (pain).   Yes [provider]  albuterol (PROAIR HFA) 108 (90 Base) MCG/ACT  inhaler Inhale 1-2 puffs into the lungs every 4 (four) hours as needed for wheezing or shortness of breath.   Yes [provider]  Calcium Carbonate-Vitamin D (CALCIUM-D PO) Take 1 tablet by mouth at bedtime.   Yes [provider]  ciprofloxacin (CIPRO) 500 MG tablet Take 500 mg by mouth See admin instructions. Take 1 tablet (500 mg) by mouth twice daily for 10 days as needed for infection   Yes [provider]  clopidogrel (PLAVIX) 75 MG tablet Take 75 mg by mouth at bedtime.  07/11/13  Yes [provider]  diltiazem (CARDIZEM LA) 240 MG 24 hr tablet Take 1 tablet (240 mg total) by mouth daily. Patient taking differently: Take 240 mg by mouth at bedtime.  07/15/13  Yes [name], MD  EPINEPHrine (EPIPEN) 0.3 mg/0.3 mL SOAJ injection Inject 0.3 mg into the muscle once as needed (severe allergic reaction).    Yes [provider]  furosemide (LASIX) 20 MG tablet Take 20 mg by mouth daily as needed for fluid or edema.   Yes [provider]  HYDROcodone-homatropine (HYCODAN) 5-1.5 MG/5ML syrup Take 5 mLs by mouth at bedtime as needed for cough.   Yes [provider]  ipratropium-albuterol (DUONEB) 0.5-2.5 (3) MG/3ML SOLN Take 3 mLs by nebulization 3 (three) times daily.  12/01/16  Yes [provider]  Multiple Vitamin (MULTIVITAMIN WITH MINERALS) TABS tablet Take 1 tablet by mouth daily.  Yes [provider]  omeprazole (PRILOSEC) 20 MG capsule Take 20 mg by mouth 2 (two) times daily. 07/14/13  Yes [provider]  OXYGEN Inhale 2 L into the lungs at bedtime.   Yes [provider]  predniSONE (DELTASONE) 10 MG tablet Take 10 mg by mouth every other day. 07/11/13  Yes [provider]  PRESCRIPTION MEDICATION Inject into the muscle See admin instructions. 2 allergy shots weekly by Dr. Fransico Michael, [place], [place]   Yes [provider]  salmeterol (SEREVENT DISKUS) 50 MCG/DOSE diskus inhaler  Inhale 1 puff into the lungs 2 (two) times daily as needed (shortness of breath).    Yes [provider]  Triamcinolone Acetonide (AZMACORT IN) Inhale 2 puffs into the lungs 4 (four) times daily as needed (shortness of breath).   Yes [provider]  Vitamin D, Ergocalciferol, (DRISDOL) 50000 UNITS CAPS capsule Take 50,000 Units by mouth See admin instructions. Take 1 capsule (50,000 units) by mouth every other Saturday   Yes [provider]  pantoprazole (PROTONIX) 40 MG tablet Take 1 tablet (40 mg total) by mouth daily. Patient not taking: Reported on 12/26/2016 12/04/14   Carlis Stable, NP    Family History No family history on file.  Social History Social History  Substance Use Topics  . Smoking status: Former Research scientist (life sciences)  . Smokeless tobacco: Not on file  . Alcohol use No     Allergies   Aspirin; Atenolol; Boniva [ibandronic acid]; Fosamax [alendronate sodium]; Latex; Mucinex [guaifenesin er]; Rocephin [ceftriaxone sodium in dextrose]; Rotateq [rotavirus vaccine live oral]; Sulfa antibiotics; Vancomycin; Penicillins; and Tobramycin sulfate   Review of Systems Review of Systems  Cardiovascular: Negative for chest pain.  Gastrointestinal: Negative for vomiting.  Neurological: Positive for focal weakness, weakness and headaches.  Psychiatric/Behavioral: Negative for confusion.  All other systems reviewed and are negative.    Physical Exam Updated Vital Signs BP (!) 163/68   Pulse 92   Resp 16   SpO2 95%   Physical Exam Physical Exam  Nursing note and vitals reviewed. Constitutional: Well developed, well nourished, non-toxic, and in no acute distress Head: Normocephalic and atraumatic.  Mouth/Throat: Oropharynx is clear and moist.  Neck: Normal range of motion. Neck supple.  Cardiovascular: Normal rate and regular rhythm.   Pulmonary/Chest: Effort normal and breath sounds normal.  Abdominal: Soft. There is no tenderness. There is no rebound and no  guarding.  Musculoskeletal: Normal range of motion.  Skin: Skin is warm and dry.  Psychiatric: Cooperative Neurological:  Alert, oriented to person, place, time, and situation. Memory grossly in tact. Fluent speech. No dysarthria or aphasia.  Cranial nerves: VF are full. Pupils are symmetric, and reactive to light. EOMI without nystagmus. No gaze deviation. Facial muscles symmetric with activation. Sensation to light touch over face diminished over right face. Hearing grossly in tact. Palate elevates symmetrically. Head turn and shoulder shrug are intact. Tongue midline.  Reflexes defered.  Muscle bulk and tone normal. Pronator drift of RUE. Right weak and grip. Right leg weak against gravity, and weak with ankle dorsi/plantarflexion. Sensation to light touch diminished over right upper and lower extremities. Coordination reveals no dysmetria with finger to nose. Slowing of the RUE with testing.     ED Treatments / Results  Labs (all labs ordered are listed, but only abnormal results are displayed) Labs Reviewed  DIFFERENTIAL - Abnormal; Notable for the following:       Result Value   Neutro Abs 9.4 (*)  Lymphs Abs 0.6 (*)    All other components within normal limits  COMPREHENSIVE METABOLIC PANEL - Abnormal; Notable for the following:    Glucose, Bld 126 (*)    Creatinine, Ser 1.05 (*)    GFR calc non Af Amer 50 (*)    GFR calc Af Amer 58 (*)    All other components within normal limits  I-STAT CHEM 8, ED - Abnormal; Notable for the following:    Creatinine, Ser 1.10 (*)    Glucose, Bld 123 (*)    All other components within normal limits  PROTIME-INR  APTT  CBC  I-STAT TROPONIN, ED    EKG  EKG Interpretation None       Radiology Ct Head Wo Contrast  Result Date: 12/26/2016 CLINICAL DATA:  RIGHT-sided weakness for several weeks EXAM: CT HEAD WITHOUT CONTRAST TECHNIQUE: Contiguous axial images were obtained from the base of the skull through the vertex COMPARISON:   None. FINDINGS: Brain: High-density ovoid mass in the high LEFT cerebral hemisphere / frontal lobe measures 3.7 x 2.8 by 3.1 cm (volume = 17 cm^3). There is vasogenic edema surrounding this ovoid mass. No extra-axial fluid collections. No intraventricular hemorrhage. No midline shift or mass effect. Basal cisterns are patent. Vascular: No hyperdense vessel or unexpected calcification. Skull: Normal. Negative for fracture or focal lesion. Sinuses/Orbits: Paranasal sinuses and mastoid air cells are clear. Orbits are clear. Other: None. IMPRESSION: 1. Large ovoid mass in the high LEFT frontal lobe with surrounding vasogenic edema is favored an intra-axial neoplasm. Parenchymal hematoma or meningioma are less favored. Recommend brain MRI with contrast for further evaluation. 2. No extra-axial fluid collections. 3. No worrisome mass effect. Findings conveyed toDANIELLE RAY on 12/26/2016  at15:40. Electronically Signed   By: Suzy Bouchard M.D.   On: 12/26/2016 15:41    Procedures Procedures (including critical care time) CRITICAL CARE Performed by: Forde Dandy   Total critical care time: 35 minutes  Critical care time was exclusive of separately billable procedures and treating other patients.  Critical care was necessary to treat or prevent imminent or life-threatening deterioration.  Critical care was time spent personally by me on the following activities: development of treatment plan with patient and/or surrogate as well as nursing, discussions with consultants, evaluation of patient's response to treatment, examination of patient, obtaining history from patient or surrogate, ordering and performing treatments and interventions, ordering and review of laboratory studies, ordering and review of radiographic studies, pulse oximetry and re-evaluation of patient's condition.  Medications Ordered in ED Medications  gadobenate dimeglumine (MULTIHANCE) injection 20 mL (not administered)  dexamethasone  (DECADRON) injection 10 mg (10 mg Intravenous Given 12/26/16 1707)  levETIRAcetam (KEPPRA) 1,000 mg in sodium chloride 0.9 % 100 mL IVPB (0 mg Intravenous Stopped 12/26/16 1727)  LORazepam (ATIVAN) injection 0.5 mg (0.5 mg Intravenous Given 12/26/16 1717)     Initial Impression / Assessment and Plan / ED Course  I have reviewed the triage vital signs and the nursing notes.  Pertinent labs & imaging results that were available during my care of the patient were reviewed by me and considered in my medical decision making (see chart for details).     CT visualized and shows evidence of left frontal mass, likely causing her symptoms of right-sided weakness. Potential focal seizure that she had earlier today. There is evidence of vasogenic edema. Given decadron 10 mg and keppra 1000 mg load.  Spoke with Dr. Cyndy Freeze from neurosurgery and Dr. Rory Percy from neurology. At this  time pending MRI w/ and w/o contrast of the brain for further evaluation. If mass, will likely require operative repair. Differential also could include CVA with hemorrhagic conversion, which neurology recommends reconsultation for. Both services recommending admission to medicine service. Discussed with Dr. Myna Hidalgo.    Final Clinical Impressions(s) / ED Diagnoses   Final diagnoses:  Brain mass  Right sided weakness  Focal seizure [facility])    New Prescriptions New Prescriptions   No medications on file     Forde Dandy, MD 12/26/16 [phone]

## 2016-12-26 NOTE — ED Triage Notes (Addendum)
Pt presents for evaluation of R sided arm weakness/trembling that started around 1000 today. Reports lasted approximately 10 min and subsided. Pt with hx of R sided weakness/paralysis approx 6 weeks ago per husband but no stroke diagnosis. Pt with no focal neuro deficits in triage. Pt taking elliquis for afib.

## 2016-12-26 NOTE — ED Notes (Signed)
Pt returned from MRI °

## 2016-12-26 NOTE — ED Notes (Signed)
ED Provider at bedside. 

## 2016-12-26 NOTE — H&P (Signed)
History and Physical    CANDANCE BOHLMAN CVE:[id number] DOB: 1940-07-27 DOA: 12/26/2016  PCP: Sherrilee Gilles, DO   Patient coming from: Home  Chief Complaint: Right-sided numbness, episode of uncontrollable right arm jerking  HPI: KAEDEN DEPAZ is a 76 y.o. female with medical history significant for paroxysmal atrial fibrillation on Eliquis, steroid dependent COPD, and history of a solitary lung tumor status post resection in 1999, now presenting to the emergency department with several weeks of right-sided numbness and a brief episode of uncontrolled right arm "jerking" just prior to arrival. Patient reports that these complaints and been evaluated by her PCP who sent her for MRI, but she was unable to tolerate due to claustrophobia. Arrangements were being made for open MRI but that had not yet been performed. Patient has been unable to walk secondary to numbness, and perhaps weakness involving the right side. She reports headaches as well, but denies change in vision or hearing, and denies confusion. She was alert and aware during the uncontrollable arm jerking that prompted her presentation to the ED today. It resolved spontaneously. She reports that she was followed for 10 years following resection of her lung tumor, and there have been no evidence of recurrence.  ED Course: Upon arrival to the ED, patient is found to be saturating well on room air, and with vitals otherwise stable. EKG features a sinus tachycardia with rate 100 and nonspecific ST abnormality in diffuse leads. Noncontrast head CT is concerning for a large ovoid mass in the high left frontal lobe with surrounding vasogenic edema. This was followed by MRI brain which reveals solitary mass in the left frontoparietal convexity with surrounding edema and extending to the dura, possibly representing metastatic disease or meningioma, but with no acute infarction identified. Chemistry panels notable for a stable creatinine at 1.05. CBC is  within normal limits, as his INR. Troponin is undetectable. Neurosurgery was consulted by the ED physician, evaluated patient in the emergency department, and recommends a medical admission with Decadron and Keppra. Neurosurgery asked that I look was be held as the patient will require resection. She remained hematologically stable and in no apparent respiratory distress in the emergency department and will be admitted to medical/surgical unit for ongoing evaluation and management of left fronto-parietal brain mass.  Review of Systems:  All other systems reviewed and apart from HPI, are negative.  Past Medical History:  Diagnosis Date  . Asthma   . Atrial fibrillation ([place])   . Emphysema/COPD ([place])   . Lung cancer [facility])     Past Surgical History:  Procedure Laterality Date  . ABDOMINAL HYSTERECTOMY    . CESAREAN SECTION    . HERNIA REPAIR    . LUNG REMOVAL, PARTIAL  04/1997     reports that she quit smoking about 38 years ago. Her smoking use included Cigarettes. She has never used smokeless tobacco. She reports that she does not drink alcohol or use drugs.  Allergies  Allergen Reactions  . Aspirin Shortness Of Breath  . Atenolol Swelling    Throat swelled and closed up  . Boniva [Ibandronic Acid] Other (See Comments)    Pt does not remember specific reaction  . Fosamax [Alendronate Sodium] Other (See Comments)    Pt does not remember specific reaction  . Latex Swelling and Rash    Throat swelled and closed up  . Mucinex [Guaifenesin Er] Anaphylaxis  . Rocephin [Ceftriaxone Sodium In Dextrose] Anaphylaxis and Rash  . Rotateq [Rotavirus Vaccine Live Oral] Other (See  Comments)    "poorly tolerated"  . Sulfa Antibiotics Other (See Comments)    Pt does not remember specific reaction  . Vancomycin Other (See Comments)    Pt does not remember specific reaction  . Penicillins Rash    Childhood reaction Has patient had a PCN reaction causing immediate rash, facial/tongue/throat  swelling, SOB or lightheadedness with hypotension: Yes Has patient had a PCN reaction causing severe rash involving mucus membranes or skin necrosis: No Has patient had a PCN reaction that required hospitalization: Unknown Has patient had a PCN reaction occurring within the last 10 years: No If all of the above answers are "NO", then may proceed with Cephalosporin use.  . Tobramycin Sulfate Rash    Family History  Problem Relation Age of Onset  . Liver cancer Other   . Heart disease Other   . Brain cancer Neg Hx      Prior to Admission medications   Medication Sig Start Date End Date Taking? Authorizing Provider  acetaminophen (TYLENOL) 500 MG tablet Take 1,000 mg by mouth every 6 (six) hours as needed for headache (pain).   Yes [provider]  albuterol (PROAIR HFA) 108 (90 Base) MCG/ACT inhaler Inhale 1-2 puffs into the lungs every 4 (four) hours as needed for wheezing or shortness of breath.   Yes [provider]  Calcium Carbonate-Vitamin D (CALCIUM-D PO) Take 1 tablet by mouth at bedtime.   Yes [provider]  ciprofloxacin (CIPRO) 500 MG tablet Take 500 mg by mouth See admin instructions. Take 1 tablet (500 mg) by mouth twice daily for 10 days as needed for infection   Yes [provider]  clopidogrel (PLAVIX) 75 MG tablet Take 75 mg by mouth at bedtime.  07/11/13  Yes [provider]  diltiazem (CARDIZEM LA) 240 MG 24 hr tablet Take 1 tablet (240 mg total) by mouth daily. Patient taking differently: Take 240 mg by mouth at bedtime.  07/15/13  Yes [name], MD  EPINEPHrine (EPIPEN) 0.3 mg/0.3 mL SOAJ injection Inject 0.3 mg into the muscle once as needed (severe allergic reaction).    Yes [provider]  furosemide (LASIX) 20 MG tablet Take 20 mg by mouth daily as needed for fluid or edema.   Yes [provider]  HYDROcodone-homatropine (HYCODAN) 5-1.5 MG/5ML syrup Take 5 mLs by mouth at bedtime as needed for cough.    Yes [provider]  ipratropium-albuterol (DUONEB) 0.5-2.5 (3) MG/3ML SOLN Take 3 mLs by nebulization 3 (three) times daily.  12/01/16  Yes [provider]  Multiple Vitamin (MULTIVITAMIN WITH MINERALS) TABS tablet Take 1 tablet by mouth daily.   Yes [provider]  omeprazole (PRILOSEC) 20 MG capsule Take 20 mg by mouth 2 (two) times daily. 07/14/13  Yes [provider]  OXYGEN Inhale 2 L into the lungs at bedtime.   Yes [provider]  predniSONE (DELTASONE) 10 MG tablet Take 10 mg by mouth every other day. 07/11/13  Yes [provider]  PRESCRIPTION MEDICATION Inject into the muscle See admin instructions. 2 allergy shots weekly by Dr. Fransico Michael, [place], [place]   Yes [provider]  salmeterol (SEREVENT DISKUS) 50 MCG/DOSE diskus inhaler Inhale 1 puff into the lungs 2 (two) times daily as needed (shortness of breath).    Yes [provider]  Triamcinolone Acetonide (AZMACORT IN) Inhale 2 puffs into the lungs 4 (four) times daily as needed (shortness of breath).   Yes [provider]  Vitamin D,  Ergocalciferol, (DRISDOL) 50000 UNITS CAPS capsule Take 50,000 Units by mouth See admin instructions. Take 1 capsule (50,000 units) by mouth every other Saturday   Yes [provider]    Physical Exam: Vitals:   12/26/16 1745 12/26/16 1930 12/26/16 2000 12/26/16 2030  BP: (!) 163/68 (!) 190/68 (!) 197/78 (!) 186/74  Pulse: 92 80 82 81  Resp: 16 13 17 14   SpO2: 95% 95% 96% 95%      Constitutional: NAD, calm, comfortable Eyes: PERTLA, lids and conjunctivae normal ENMT: Mucous membranes are moist. Posterior pharynx clear of any exudate or lesions.   Neck: normal, supple, no masses, no thyromegaly Respiratory: clear to auscultation bilaterally, no wheezing, no crackles. Normal respiratory effort.   Cardiovascular: S1 & S2 heard, regular rate and rhythm. Trace pretibial edema bilaterally. No significant  JVD. Abdomen: No distension, no tenderness, no masses palpated. Bowel sounds normal.  Musculoskeletal: no clubbing / cyanosis. No joint deformity upper and lower extremities.   Skin: no significant rashes, lesions, ulcers. Warm, dry, well-perfused. Neurologic: CN 2-12 grossly intact. Sensation diminished at RUE and RLE. Strength 5/5 involving left upper and lower extremities, 3-4/5 at right UE and LE.  Psychiatric: Alert and oriented x 3. Pleasant and cooperative.     Labs on Admission: I have personally reviewed following labs and imaging studies  CBC:  Recent Labs Lab 12/26/16 1413 12/26/16 1434  WBC 10.1  --   NEUTROABS 9.4*  --   HGB 12.8 13.6  HCT 40.9 40.0  MCV 94.5  --   PLT 156  --    Basic Metabolic Panel:  Recent Labs Lab 12/26/16 1413 12/26/16 1434  NA 142 143  K 3.9 3.9  CL 108 105  CO2 25  --   GLUCOSE 126* 123*  BUN 11 15  CREATININE 1.05* 1.10*  CALCIUM 9.7  --    GFR: CrCl cannot be calculated (Unknown ideal weight.). Liver Function Tests:  Recent Labs Lab 12/26/16 1413  AST 24  ALT 14  ALKPHOS 63  BILITOT 0.6  PROT 7.0  ALBUMIN 4.0   No results for input(s): LIPASE, AMYLASE in the last 168 hours. No results for input(s): AMMONIA in the last 168 hours. Coagulation Profile:  Recent Labs Lab 12/26/16 1413  INR 0.94   Cardiac Enzymes: No results for input(s): CKTOTAL, CKMB, CKMBINDEX, TROPONINI in the last 168 hours. BNP (last 3 results) No results for input(s): PROBNP in the last 8760 hours. HbA1C: No results for input(s): HGBA1C in the last 72 hours. CBG: No results for input(s): GLUCAP in the last 168 hours. Lipid Profile: No results for input(s): CHOL, HDL, LDLCALC, TRIG, CHOLHDL, LDLDIRECT in the last 72 hours. Thyroid Function Tests: No results for input(s): TSH, T4TOTAL, FREET4, T3FREE, THYROIDAB in the last 72 hours. Anemia Panel: No results for input(s): VITAMINB12, FOLATE, FERRITIN, TIBC, IRON, RETICCTPCT in the last 72  hours. Urine analysis: No results found for: COLORURINE, APPEARANCEUR, LABSPEC, PHURINE, GLUCOSEU, HGBUR, BILIRUBINUR, KETONESUR, PROTEINUR, UROBILINOGEN, NITRITE, LEUKOCYTESUR Sepsis Labs: @LABRCNTIP (procalcitonin:4,lacticidven:4) )No results found for this or any previous visit (from the past 240 hour(s)).   Radiological Exams on Admission: Ct Head Wo Contrast  Result Date: 12/26/2016 CLINICAL DATA:  RIGHT-sided weakness for several weeks EXAM: CT HEAD WITHOUT CONTRAST TECHNIQUE: Contiguous axial images were obtained from the base of the skull through the vertex COMPARISON:  None. FINDINGS: Brain: High-density ovoid mass in the high LEFT cerebral hemisphere / frontal lobe measures 3.7 x 2.8 by 3.1 cm (volume = 17 cm^3).  There is vasogenic edema surrounding this ovoid mass. No extra-axial fluid collections. No intraventricular hemorrhage. No midline shift or mass effect. Basal cisterns are patent. Vascular: No hyperdense vessel or unexpected calcification. Skull: Normal. Negative for fracture or focal lesion. Sinuses/Orbits: Paranasal sinuses and mastoid air cells are clear. Orbits are clear. Other: None. IMPRESSION: 1. Large ovoid mass in the high LEFT frontal lobe with surrounding vasogenic edema is favored an intra-axial neoplasm. Parenchymal hematoma or meningioma are less favored. Recommend brain MRI with contrast for further evaluation. 2. No extra-axial fluid collections. 3. No worrisome mass effect. Findings conveyed toDANIELLE RAY on 12/26/2016  at15:40. Electronically Signed   By: Suzy Bouchard M.D.   On: 12/26/2016 15:41   Mr Brain W And Wo Contrast  Result Date: 12/26/2016 CLINICAL DATA:  Seizure. Right-sided weakness. On Eliquis for atrial fibrillation. History of lung cancer 19 years ago. EXAM: MRI HEAD WITHOUT AND WITH CONTRAST TECHNIQUE: Multiplanar, multiecho pulse sequences of the brain and surrounding structures were obtained without and with intravenous contrast. CONTRAST:  20 mL  MultiHance IV COMPARISON:  CT head 12/26/2016 FINDINGS: Brain: Image quality degraded by motion. Enhancing mass lesion in the high left frontal parietal lobe measuring 3.8 x 2.8 x 4.2 cm. The mass is hyperdense on CT with a mild amount of susceptibility on MRI. This could be calcification or hemorrhage. The mass is well-circumscribed and solid. Mild surrounding vasogenic edema. The mass extends to the dural surface. It is difficult determine if this is intra-axial or extra-axial. No other mass lesion. Atrophy and chronic white matter ischemia. 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 No acute infarct. Ventricle size normal. No shift of the midline structures. Vascular: Normal arterial flow void Skull and upper cervical spine: Negative Sinuses/Orbits: Bilateral cataract removal. Paranasal sinuses clear. Other: None IMPRESSION: Image quality degraded by motion. Solitary mass in the left frontal parietal convexity with surrounding edema. It is difficult determine if this is intra-axial or extra-axial however the mass does extend to the dural surface. Possible meningioma. Metastatic disease possible. Atrophy and chronic microvascular ischemic change. No acute infarct. Electronically Signed   By: Franchot Gallo M.D.   On: 12/26/2016 19:03    EKG: Independently reviewed. Sinus tachycardia (rate 100), non-specific ST abnormality in diffuse leads.   Assessment/Plan  1. Brain mass  - Pt presents with several weeks of right-sided numbness and weakness, as well as headache  - She was evaluated by PCP and sent for MRI, but could not tolerate d/t claustrophobia  - With inability to ambulate secondary to her sxs, and with no right arm clonus, she came into ED for eval  - Head CT reveals left frontal mass with vasogenic edema; MRI confirms solitary left fronto-parietal mass with vasogenic edema, but no concerning mass-effect or acute infarct  - Neurosurgery is consulting and much appreciated, planning for  resection - Pt was treated in ED with Decadron 10 mg IV and Keppra 1000 mg IV  - Hold Eliquis and Plavix for surgery, continue Decadron and Keppra   2. COPD, steroid-dependent  - Stable on admission; no wheezing, cough, or dyspnea  - She takes prednisone 10 mg qOD at home, will hold while on Decadron as above  - Continue inhalers, prn nebs   3. Paroxysmal atrial fibrillation  - In a sinus rhythm on presentation  - CHADS-VASc is at least 88 (age x2, gender)  - Managed at home with Eliquis per pt report, though not seen on her med list; she reports taking it nightly,  states she took it night prior to admission  - Hold [facility] in preparation for surgery  - Continue diltiazem as tolerated    4. Hx of lung cancer  - Pt reports a solitary tumor was resected in 1999  - She reports being followed closely for this for 10 yrs with no evidence of resection    5. CKD stage II  - SCr appears to be stable at 1.05 on admission  - Avoid dehydration or hypotension, renally-dose medications as needed    DVT prophylaxis: sq heparin Code Status: Full  Family Communication: Husband updated at bedside Disposition Plan: Admit to med-surg Consults called: Neurosurgery Admission status: Inpatient    Vianne Bulls, MD [facility] Pager [phone]  If 7PM-7AM, please contact night-coverage [email or web address] Password [facility]  12/26/2016, 8:48 PM

## 2016-12-26 NOTE — ED Notes (Signed)
Attempted PIVx2; Second RN to attempt.

## 2016-12-26 NOTE — ED Notes (Signed)
Patient transported to MRI 

## 2016-12-27 DIAGNOSIS — R569 Unspecified convulsions: Secondary | ICD-10-CM

## 2016-12-27 DIAGNOSIS — K219 Gastro-esophageal reflux disease without esophagitis: Secondary | ICD-10-CM

## 2016-12-27 LAB — BASIC METABOLIC PANEL
ANION GAP: 9 (ref 5–15)
BUN: 16 mg/dL (ref 6–20)
CO2: 25 mmol/L (ref 22–32)
Calcium: 9.6 mg/dL (ref 8.9–10.3)
Chloride: 107 mmol/L (ref 101–111)
Creatinine, Ser: 0.86 mg/dL (ref 0.44–1.00)
GFR calc Af Amer: >60 mL/min (ref >60)
GLUCOSE: 162 mg/dL — AB (ref 65–99)
POTASSIUM: 4 mmol/L (ref 3.5–5.1)
Sodium: 141 mmol/L (ref 135–145)

## 2016-12-27 LAB — CBC
HEMATOCRIT: 38.6 % (ref 36.0–46.0)
HEMOGLOBIN: 11.9 g/dL — AB (ref 12.0–15.0)
MCH: 28.7 pg (ref 26.0–34.0)
MCHC: 30.8 g/dL (ref 30.0–36.0)
MCV: 93.2 fL (ref 78.0–100.0)
Platelets: 143 10*3/uL — ABNORMAL LOW (ref 150–400)
RBC: 4.14 MIL/uL (ref 3.87–5.11)
RDW: 14.2 % (ref 11.5–15.5)
WBC: 7.2 10*3/uL (ref 4.0–10.5)

## 2016-12-27 MED ORDER — DEXAMETHASONE 4 MG PO TABS
4.0000 mg | ORAL_TABLET | Freq: Four times a day (QID) | ORAL | Status: DC
  Administered 2016-12-27 – 2016-12-30 (×11): 4 mg via ORAL
  Filled 2016-12-27 (×11): qty 1

## 2016-12-27 MED ORDER — IPRATROPIUM-ALBUTEROL 0.5-2.5 (3) MG/3ML IN SOLN
3.0000 mL | Freq: Three times a day (TID) | RESPIRATORY_TRACT | Status: DC
  Administered 2016-12-27 – 2017-01-07 (×31): 3 mL via RESPIRATORY_TRACT
  Filled 2016-12-27 (×34): qty 3

## 2016-12-27 MED ORDER — HEPARIN (PORCINE) IN NACL 100-0.45 UNIT/ML-% IJ SOLN
1350.0000 [IU]/h | INTRAMUSCULAR | Status: DC
  Administered 2016-12-27: 1050 [IU]/h via INTRAVENOUS
  Administered 2016-12-28: 1350 [IU]/h via INTRAVENOUS
  Filled 2016-12-27 (×2): qty 250

## 2016-12-27 NOTE — Progress Notes (Addendum)
ANTICOAGULATION CONSULT NOTE - Initial Consult  Pharmacy Consult for heparin Indication: atrial fibrillation  Allergies  Allergen Reactions  . Aspirin Shortness Of Breath  . Atenolol Swelling    Throat swelled and closed up  . Boniva [Ibandronic Acid] Other (See Comments)    Pt does not remember specific reaction  . Fosamax [Alendronate Sodium] Other (See Comments)    Pt does not remember specific reaction  . Latex Swelling and Rash    Throat swelled and closed up  . Mucinex [Guaifenesin Er] Anaphylaxis  . Rocephin [Ceftriaxone Sodium In Dextrose] Anaphylaxis and Rash  . Rotateq [Rotavirus Vaccine Live Oral] Other (See Comments)    "poorly tolerated"  . Sulfa Antibiotics Other (See Comments)    Pt does not remember specific reaction  . Vancomycin Other (See Comments)    Pt does not remember specific reaction  . Penicillins Rash    Childhood reaction Has patient had a PCN reaction causing immediate rash, facial/tongue/throat swelling, SOB or lightheadedness with hypotension: Yes Has patient had a PCN reaction causing severe rash involving mucus membranes or skin necrosis: No Has patient had a PCN reaction that required hospitalization: Unknown Has patient had a PCN reaction occurring within the last 10 years: No If all of the above answers are "NO", then may proceed with Cephalosporin use.  . Tobramycin Sulfate Rash    Patient Measurements: Height: 5\' 5"  (165.1 cm) Weight: 190 lb 4.1 oz (86.3 kg) IBW/kg (Calculated) : 57 Heparin Dosing Weight: 75.8  Vital Signs: Temp: 98 F (36.7 C) (09/24 1630) Temp Source: Oral (09/24 1630) BP: 146/61 (09/24 1630) Pulse Rate: 73 (09/24 1630)  Labs:  Recent Labs  12/26/16 1413 12/26/16 1434 12/27/16 0515  HGB 12.8 13.6 11.9*  HCT 40.9 40.0 38.6  PLT 156  --  143*  APTT 25  --   --   LABPROT 12.5  --   --   INR 0.94  --   --   CREATININE 1.05* 1.10* 0.86    Estimated Creatinine Clearance: 60.4 mL/min (by C-G formula based  on SCr of 0.86 mg/dL).   Medical History: Past Medical History:  Diagnosis Date  . Asthma   . Atrial fibrillation ([place])   . Emphysema/COPD ([place])   . Lung cancer ([place])     Medications:  Scheduled:  . arformoterol  15 mcg Nebulization BID  . calcium-vitamin D  1 tablet Oral QHS  . dexamethasone  4 mg Oral Q6H  . diltiazem  240 mg Oral QHS  . ipratropium-albuterol  3 mL Nebulization TID  . levETIRAcetam  500 mg Oral BID  . multivitamin with minerals  1 tablet Oral Daily  . pantoprazole  40 mg Oral BID AC    Assessment: 57 yof admitted for right sided numbness. Plan for possible craniotomy on Thursday per neurosurgery. Patient has a history of paroxysmal atrial fibrillation and notes indicate on Eliquis. Medication is not on prior to admission list and upon conversation with patient, unclear if taking at home. H/H and platelets down slightly from admission today. Last dose of subQ heparin for DVT prophylaxis was on 9/24 at 1400. No signs/symptoms of bleeding in chart.  Goal of Therapy:  Heparin level 0.3-0.7 units/ml Monitor platelets by anticoagulation protocol: Yes   Plan:  Start heparin infusion at 1050 units/hr Check anti-Xa level in 8 hours and daily while on heparin Continue to monitor H&H and platelets  Doylene Canard, PharmD Clinical Pharmacist  Phone: [phone] 12/27/2016,8:25 PM

## 2016-12-27 NOTE — Plan of Care (Signed)
Problem: Safety: Goal: Ability to remain free from injury will improve Outcome: Progressing Bed alarm on. Call bell within reach Assist x1 to [facility] No fall this shift Spouse at bedside

## 2016-12-27 NOTE — Progress Notes (Signed)
Patient ID: Tracy Bridges, female   DOB: 1940/06/18, 76 y.o.   MRN: [id number] BP (!) 146/61 (BP Location: Right Arm)   Pulse 73   Temp 98 F (36.7 C) (Oral)   Resp 17   Ht 5\' 5"  (1.651 m)   Wt 86.3 kg (190 lb 4.1 oz)   SpO2 96%   BMI 31.66 kg/m  Alert and oriented x 4, speech is clear and fluent Perrl, full eom Continued right sided weakness Will place on heparin systemically for afib Plan on OR this week Thursday No neuro changes since admission

## 2016-12-28 ENCOUNTER — Other Ambulatory Visit: Payer: Self-pay | Admitting: Neurosurgery

## 2016-12-28 DIAGNOSIS — R569 Unspecified convulsions: Secondary | ICD-10-CM

## 2016-12-28 LAB — BASIC METABOLIC PANEL
ANION GAP: 14 (ref 5–15)
BUN: 22 mg/dL — AB (ref 6–20)
CHLORIDE: 105 mmol/L (ref 101–111)
CO2: 22 mmol/L (ref 22–32)
Calcium: 10 mg/dL (ref 8.9–10.3)
Creatinine, Ser: 1.27 mg/dL — ABNORMAL HIGH (ref 0.44–1.00)
GFR calc Af Amer: 46 mL/min — ABNORMAL LOW (ref >60)
GFR, EST NON AFRICAN AMERICAN: 40 mL/min — AB (ref >60)
GLUCOSE: 191 mg/dL — AB (ref 65–99)
POTASSIUM: 3.7 mmol/L (ref 3.5–5.1)
Sodium: 141 mmol/L (ref 135–145)

## 2016-12-28 LAB — CBC
HEMATOCRIT: 35.1 % — AB (ref 36.0–46.0)
HEMOGLOBIN: 11.2 g/dL — AB (ref 12.0–15.0)
MCH: 29.4 pg (ref 26.0–34.0)
MCHC: 31.9 g/dL (ref 30.0–36.0)
MCV: 92.1 fL (ref 78.0–100.0)
Platelets: 145 10*3/uL — ABNORMAL LOW (ref 150–400)
RBC: 3.81 MIL/uL — AB (ref 3.87–5.11)
RDW: 14.2 % (ref 11.5–15.5)
WBC: 11.2 10*3/uL — ABNORMAL HIGH (ref 4.0–10.5)

## 2016-12-28 LAB — HEPARIN LEVEL (UNFRACTIONATED)

## 2016-12-28 LAB — GLUCOSE, CAPILLARY: Glucose-Capillary: 151 mg/dL — ABNORMAL HIGH (ref 65–99)

## 2016-12-28 MED ORDER — BUDESONIDE 0.5 MG/2ML IN SUSP
0.5000 mg | Freq: Two times a day (BID) | RESPIRATORY_TRACT | Status: DC
  Administered 2016-12-28 – 2017-01-07 (×19): 0.5 mg via RESPIRATORY_TRACT
  Filled 2016-12-28 (×20): qty 2

## 2016-12-28 MED ORDER — LORATADINE 10 MG PO TABS: 10.0000 mg | ORAL_TABLET | Freq: Every day | ORAL | Status: DC

## 2016-12-28 MED ORDER — FLUTICASONE PROPIONATE 50 MCG/ACT NA SUSP
2.0000 | Freq: Every day | NASAL | Status: DC
  Filled 2016-12-28: qty 16

## 2016-12-28 NOTE — Progress Notes (Signed)
ANTICOAGULATION CONSULT NOTE - [place] for heparin Indication: atrial fibrillation  Allergies  Allergen Reactions  . Aspirin Shortness Of Breath  . Atenolol Swelling    Throat swelled and closed up  . Boniva [Ibandronic Acid] Other (See Comments)    Pt does not remember specific reaction  . Fosamax [Alendronate Sodium] Other (See Comments)    Pt does not remember specific reaction  . Latex Swelling and Rash    Throat swelled and closed up  . Mucinex [Guaifenesin Er] Anaphylaxis  . Rocephin [Ceftriaxone Sodium In Dextrose] Anaphylaxis and Rash  . Rotateq [Rotavirus Vaccine Live Oral] Other (See Comments)    "poorly tolerated"  . Sulfa Antibiotics Other (See Comments)    Pt does not remember specific reaction  . Vancomycin Other (See Comments)    Pt does not remember specific reaction  . Penicillins Rash    Childhood reaction Has patient had a PCN reaction causing immediate rash, facial/tongue/throat swelling, SOB or lightheadedness with hypotension: Yes Has patient had a PCN reaction causing severe rash involving mucus membranes or skin necrosis: No Has patient had a PCN reaction that required hospitalization: Unknown Has patient had a PCN reaction occurring within the last 10 years: No If all of the above answers are "NO", then may proceed with Cephalosporin use.  . Tobramycin Sulfate Rash    Patient Measurements: Height: 5\' 5"  (165.1 cm) Weight: 190 lb 4.1 oz (86.3 kg) IBW/kg (Calculated) : 57 Heparin Dosing Weight: 75.8  Vital Signs: Temp: 97.8 F (36.6 C) (09/25 0448) Temp Source: Oral (09/25 0448) BP: 150/58 (09/25 0448) Pulse Rate: 68 (09/25 0900)  Labs:  Recent Labs  12/26/16 1413 12/26/16 1434 12/27/16 0515 12/28/16 0530  HGB 12.8 13.6 11.9* 11.2*  HCT 40.9 40.0 38.6 35.1*  PLT 156  --  143* 145*  APTT 25  --   --   --   LABPROT 12.5  --   --   --   INR 0.94  --   --   --   HEPARINUNFRC  --   --   --  <0.10*  CREATININE  1.05* 1.10* 0.86  --     Estimated Creatinine Clearance: 60.4 mL/min (by C-G formula based on SCr of 0.86 mg/dL).   Medical History: Past Medical History:  Diagnosis Date  . Asthma   . Atrial fibrillation ([place])   . Emphysema/COPD ([place])   . Lung cancer ([place])     Medications:  Scheduled:  . arformoterol  15 mcg Nebulization BID  . calcium-vitamin D  1 tablet Oral QHS  . dexamethasone  4 mg Oral Q6H  . diltiazem  240 mg Oral QHS  . ipratropium-albuterol  3 mL Nebulization TID  . levETIRAcetam  500 mg Oral BID  . multivitamin with minerals  1 tablet Oral Daily  . pantoprazole  40 mg Oral BID AC    Assessment: 76 yo F admitted for right sided numbness. Plan for possible craniotomy on Thursday per neurosurgery. Patient has a history of paroxysmal atrial fibrillation and notes indicate on Eliquis. Medication is not on prior to admission list and upon conversation with patient, unclear if taking at home.  Confirmed with outpatient pharmacy, no prescriptions ever filled for Eliquis (Plavix only).  Heparin level <0.1 also confirms no Eliquis currently in system.  Goal of Therapy:  Heparin level 0.3-0.7 units/ml Monitor platelets by anticoagulation protocol: Yes   Plan:  D/C aPTTs Increase heparin infusion to 1350units/hr Check anti-Xa level in 6hours and  daily while on heparin Continue to monitor H&H and platelets F/u plan for craniotomy on Thursday - hold heparin prior?  [employer], Pharm.D., BCPS Clinical Pharmacist Pager: [phone] Clinical phone for 12/28/2016 from 8:30-4:00 is [phone]. After 4pm, please call Main Rx [phone]) for assistance. 12/28/2016 10:48 AM

## 2016-12-28 NOTE — Plan of Care (Signed)
Problem: Education: Goal: Knowledge of [place] [facility] Education information/materials will improve Outcome: Progressing POC reviewed with pt. and daughter.

## 2016-12-28 NOTE — Progress Notes (Signed)
ANTICOAGULATION CONSULT NOTE - [place] for heparin Indication: atrial fibrillation  Allergies  Allergen Reactions  . Aspirin Shortness Of Breath  . Atenolol Swelling    Throat swelled and closed up  . Boniva [Ibandronic Acid] Other (See Comments)    Pt does not remember specific reaction  . Fosamax [Alendronate Sodium] Other (See Comments)    Pt does not remember specific reaction  . Latex Swelling and Rash    Throat swelled and closed up  . Mucinex [Guaifenesin Er] Anaphylaxis  . Rocephin [Ceftriaxone Sodium In Dextrose] Anaphylaxis and Rash  . Rotateq [Rotavirus Vaccine Live Oral] Other (See Comments)    "poorly tolerated"  . Sulfa Antibiotics Other (See Comments)    Pt does not remember specific reaction  . Vancomycin Other (See Comments)    Pt does not remember specific reaction  . Penicillins Rash    Childhood reaction Has patient had a PCN reaction causing immediate rash, facial/tongue/throat swelling, SOB or lightheadedness with hypotension: Yes Has patient had a PCN reaction causing severe rash involving mucus membranes or skin necrosis: No Has patient had a PCN reaction that required hospitalization: Unknown Has patient had a PCN reaction occurring within the last 10 years: No If all of the above answers are "NO", then may proceed with Cephalosporin use.  . Tobramycin Sulfate Rash    Patient Measurements: Height: 5\' 5"  (165.1 cm) Weight: 190 lb 4.1 oz (86.3 kg) IBW/kg (Calculated) : 57 Heparin Dosing Weight: 75.8  Vital Signs: BP: 137/49 (09/25 1422) Pulse Rate: 72 (09/25 1422)  Labs:  Recent Labs  12/26/16 1413 12/26/16 1434 12/27/16 0515 12/28/16 0530 12/28/16 1121 12/28/16 1813  HGB 12.8 13.6 11.9* 11.2*  --   --   HCT 40.9 40.0 38.6 35.1*  --   --   PLT 156  --  143* 145*  --   --   APTT 25  --   --   --   --   --   LABPROT 12.5  --   --   --   --   --   INR 0.94  --   --   --   --   --   HEPARINUNFRC  --   --   --   <0.10*  --  <0.10*  CREATININE 1.05* 1.10* 0.86  --  1.27*  --     Estimated Creatinine Clearance: 40.9 mL/min (A) (by C-G formula based on SCr of 1.27 mg/dL (H)).   Medical History: Past Medical History:  Diagnosis Date  . Asthma   . Atrial fibrillation ([place])   . Emphysema/COPD ([place])   . Lung cancer ([place])     Medications:  Scheduled:  . arformoterol  15 mcg Nebulization BID  . budesonide (PULMICORT) nebulizer solution  0.5 mg Nebulization BID  . calcium-vitamin D  1 tablet Oral QHS  . dexamethasone  4 mg Oral Q6H  . diltiazem  240 mg Oral QHS  . fluticasone  2 spray Each Nare Daily  . ipratropium-albuterol  3 mL Nebulization TID  . levETIRAcetam  500 mg Oral BID  . multivitamin with minerals  1 tablet Oral Daily  . pantoprazole  40 mg Oral BID AC    Assessment: 76 yo F admitted for right sided numbness. Plan for possible craniotomy on Thursday per neurosurgery. Patient has a history of paroxysmal atrial fibrillation and notes indicate on Eliquis. Medication is not on prior to admission list and upon conversation with patient, unclear if taking  at home.  Confirmed with outpatient pharmacy, no prescriptions ever filled for Eliquis (Plavix only).  Heparin level <0.1 also confirms no Eliquis currently in system.  PM f/u > heparin level remains undetectable despite rate increase earlier today.  Spoke with RN, upon examination of IV site, swollen and feels cool.    Goal of Therapy:  Heparin level 0.3-0.7 units/ml Monitor platelets by anticoagulation protocol: Yes   Plan:  RN to move heparin infusion to new IV site. Recheck heparin level in 6 hrs after restart. Daily heparin level and CBC.  Uvaldo Rising, BCPS  Clinical Pharmacist Pager [phone]  12/28/2016 7:44 PM

## 2016-12-28 NOTE — Progress Notes (Signed)
PROGRESS NOTE    Tracy Bridges  VXY:[id number] DOB: 1940-06-03 DOA: 12/26/2016 PCP: Sherrilee Gilles, DO    Brief Narrative:  Patient is a 76 year old female history of paroxysmal atrial fibrillation on anticoagulation, steroid-dependent COPD, solitary lung tumor status post resection 1999 presented to the ED with several weeks of right-sided numbness and weakness as well as right upper extremity jerking and right lower extremity weakness.CT MRI obtained consistent with a brain mass. Patient given Decadron and Keppra. Neurosurgery consulted.    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 Patient with an episode of right upper extremity jerking. Patient seen by PCP sent for MRI could nottolerate MRI due to claustrophobia. Due to worsening symptoms patient presented to the ED. CT head showed frontal mass with vasogenic edema. MRI: Firm and solitary left frontoparietal mass with vasogenic edema. Negative for infarct. Patient given a dose of Decadron 10 mg IV and Keppra 1000 mg IV. Patient started on IV heparin per neurosurgery. Patient likely to the OR this week hopefully on Thursday, 12/30/2016 per neurosurge Continue current dose of Decadron and Keppra. Per neurosurgery.  #2 steroid-dependent COPD Patient noted to be wheezing. Continue Brovana. Start Pulmicort twice a day. Continue scheduled nebs. Continue Decadron. Add Flonase.   #3 paroxysmal atrial fibrillation CHA2DS2VASC AT LEAST 3 Currently rate controlled on Cardizem. Patient currently on heparin perioperatively. Postop neurosurgery to advise when patient's long-term home oral anticoagulation may be resumed.   #4 history of lung  cancer Per patient solitary tumor status post resection 1999. Patient has been followed closely for 10 years with no evidence of recurrence.  #5 chronic kidney disease stage II Stable. Follow.  # 6 probable focal seizure Patient had presented with an episode of upper extremity jerking likely probable focal seizure secondary to problem #1. No further episodes. Continue Keppra for seizure prophylaxis.     DVT prophylaxis: Heparin Code Status: full Family Communication: updated patient. No family present. Disposition Plan: skilled nursing facility versus home with home health pending hospitalization and probable surgery.   Consultants:   Neurosurgery: Dr. Christella Noa 12/26/2016  Procedures:   MRI head 12/26/2016  CT head 12/26/2016  Antimicrobials:   none   Subjective: Patient states right lower extremity weakness improving. No chest pain. No shortness of breath. Patient complaining no wheezing. No further right upper extremity jerking.   Objective: Vitals:   12/27/16 2032 12/27/16 2034 12/28/16 0448 12/28/16 0900  BP:   (!) 150/58   Pulse:   (!) 56 68  Resp:   20 (!) 22  Temp:   97.8 F (36.6 C)   TempSrc:   Oral   SpO2: 95% 95% 97% 96%  Weight:      Height:        Intake/Output Summary (Last 24 hours) at 12/28/16 1317 Last data filed at 12/27/16 1337  Gross per 24 hour  Intake                0 ml  Output              200 ml  Net             -200 ml   [employer]  12/27/16 0014 12/27/16 0552  Weight: 86.4 kg (190 lb 7.6 oz) 86.3 kg (190 lb 4.1 oz)    Examination:  General exam: Appears calm and comfortable  Respiratory system: Expiratory and expiratory wheezing. No crackles. No rhonchi. Respiratory effort normal. Cardiovascular system: S1 & S2 heard, RRR. No JVD, murmurs, rubs, gallops or clicks. No pedal edema. Gastrointestinal system: Abdomen is nondistended, soft and nontender. No organomegaly or masses felt. Normal bowel sounds heard. Central  nervous system: Alert and oriented. Right upper and right lower extremity weakness. Right lower extremity weakness improving. Extremities: Symmetric 5 x 5 power. Skin: No rashes, lesions or ulcers Psychiatry: Judgement and insight appear normal. Mood & affect appropriate.     Data Reviewed: I have personally reviewed following labs and imaging studies  CBC:  Recent Labs Lab 12/26/16 1413 12/26/16 1434 12/27/16 0515 12/28/16 0530  WBC 10.1  --  7.2 11.2*  NEUTROABS 9.4*  --   --   --   HGB 12.8 13.6 11.9* 11.2*  HCT 40.9 40.0 38.6 35.1*  MCV 94.5  --  93.2 92.1  PLT 156  --  143* 841*   Basic Metabolic Panel:  Recent Labs Lab 12/26/16 1413 12/26/16 1434 12/27/16 0515 12/28/16 1121  NA 142 143 141 141  K 3.9 3.9 4.0 3.7  CL 108 105 107 105  CO2 25  --  25 22  GLUCOSE 126* 123* 162* 191*  BUN 11 15 16  22*  CREATININE 1.05* 1.10* 0.86 1.27*  CALCIUM 9.7  --  9.6 10.0   GFR: Estimated Creatinine Clearance: 40.9 mL/min (A) (by C-G formula based on SCr of 1.27 mg/dL (H)). Liver Function Tests:  Recent Labs Lab 12/26/16 1413  AST 24  ALT 14  ALKPHOS 63  BILITOT 0.6  PROT 7.0  ALBUMIN 4.0   No results for input(s): LIPASE, AMYLASE in the last 168 hours. No results for input(s): AMMONIA in the last 168 hours. Coagulation Profile:  Recent Labs Lab 12/26/16 1413  INR 0.94   Cardiac Enzymes: No results for input(s): CKTOTAL, CKMB, CKMBINDEX, TROPONINI in the last 168 hours. BNP (last 3 results) No results for input(s): PROBNP in the last 8760 hours. HbA1C: No results for input(s): HGBA1C in the last 72 hours. CBG:  Recent Labs Lab 12/28/16 0835  GLUCAP 151*   Lipid Profile: No results for input(s): CHOL, HDL, LDLCALC, TRIG, CHOLHDL, LDLDIRECT in the last 72 hours. Thyroid Function Tests: No results for input(s): TSH, T4TOTAL, FREET4, T3FREE, THYROIDAB in the last 72 hours. Anemia Panel: No results for input(s): VITAMINB12, FOLATE, FERRITIN, TIBC,  IRON, RETICCTPCT in the last 72 hours. Sepsis Labs: No results for input(s): PROCALCITON, LATICACIDVEN in the last 168 hours.  No results found for this or any previous visit (from the past 240 hour(s)).       Radiology Studies: Ct Head Wo Contrast  Result Date: 12/26/2016 CLINICAL DATA:  RIGHT-sided weakness for several weeks EXAM: CT HEAD WITHOUT CONTRAST TECHNIQUE: Contiguous axial images were obtained from the base of the skull through the vertex COMPARISON:  None. FINDINGS: Brain: High-density ovoid mass in the high LEFT cerebral hemisphere / frontal lobe measures 3.7 x 2.8 by 3.1 cm (volume = 17 cm^3). There is vasogenic edema surrounding this ovoid mass. No extra-axial fluid collections. No intraventricular hemorrhage. No midline shift or mass effect. Basal cisterns are patent. Vascular: No hyperdense vessel or unexpected calcification. Skull: Normal. Negative for fracture or focal lesion. Sinuses/Orbits: Paranasal sinuses and mastoid air cells are clear. Orbits  are clear. Other: None. IMPRESSION: 1. Large ovoid mass in the high LEFT frontal lobe with surrounding vasogenic edema is favored an intra-axial neoplasm. Parenchymal hematoma or meningioma are less favored. Recommend brain MRI with contrast for further evaluation. 2. No extra-axial fluid collections. 3. No worrisome mass effect. Findings conveyed toDANIELLE RAY on 12/26/2016  at15:40. Electronically Signed   By: Suzy Bouchard M.D.   On: 12/26/2016 15:41   Mr Brain W And Wo Contrast  Result Date: 12/26/2016 CLINICAL DATA:  Seizure. Right-sided weakness. On Eliquis for atrial fibrillation. History of lung cancer 19 years ago. EXAM: MRI HEAD WITHOUT AND WITH CONTRAST TECHNIQUE: Multiplanar, multiecho pulse sequences of the brain and surrounding structures were obtained without and with intravenous contrast. CONTRAST:  20 mL MultiHance IV COMPARISON:  CT head 12/26/2016 FINDINGS: Brain: Image quality degraded by motion. Enhancing mass  lesion in the high left frontal parietal lobe measuring 3.8 x 2.8 x 4.2 cm. The mass is hyperdense on CT with a mild amount of susceptibility on MRI. This could be calcification or hemorrhage. The mass is well-circumscribed and solid. Mild surrounding vasogenic edema. The mass extends to the dural surface. It is difficult determine if this is intra-axial or extra-axial. No other mass lesion. Atrophy and chronic white matter ischemia. 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 No acute infarct. Ventricle size normal. No shift of the midline structures. Vascular: Normal arterial flow void Skull and upper cervical spine: Negative Sinuses/Orbits: Bilateral cataract removal. Paranasal sinuses clear. Other: None IMPRESSION: Image quality degraded by motion. Solitary mass in the left frontal parietal convexity with surrounding edema. It is difficult determine if this is intra-axial or extra-axial however the mass does extend to the dural surface. Possible meningioma. Metastatic disease possible. Atrophy and chronic microvascular ischemic change. No acute infarct. Electronically Signed   By: Franchot Gallo M.D.   On: 12/26/2016 19:03        Scheduled Meds: . arformoterol  15 mcg Nebulization BID  . budesonide (PULMICORT) nebulizer solution  0.5 mg Nebulization BID  . calcium-vitamin D  1 tablet Oral QHS  . dexamethasone  4 mg Oral Q6H  . diltiazem  240 mg Oral QHS  . fluticasone  2 spray Each Nare Daily  . ipratropium-albuterol  3 mL Nebulization TID  . levETIRAcetam  500 mg Oral BID  . multivitamin with minerals  1 tablet Oral Daily  . pantoprazole  40 mg Oral BID AC   Continuous Infusions: . heparin 1,350 Units/hr (12/28/16 1049)     LOS: 2 days    Time spent: 36 mins    THOMPSON,DANIEL, MD [facility] Pager [phone]  If 7PM-7AM, please contact night-coverage [email or web address] Password [email or web address] 12/28/2016, 1:17 PM

## 2016-12-28 NOTE — Progress Notes (Signed)
PROGRESS NOTE    Tracy Bridges  FYB:[id number] DOB: 1941/03/29 DOA: 12/26/2016 PCP: Sherrilee Gilles, DO    Brief Narrative:  Patient is a 76 year old female history of paroxysmal atrial fibrillation on anticoagulation, steroid-dependent COPD, solitary lung tumor status post resection 1999 presented to the ED with several weeks of right-sided numbness and weakness as well as right upper extremity jerking and right lower extremity weakness.CT MRI obtained consistent with a brain mass. Patient given Decadron and Keppra. Neurosurgery consulted. Anticoagulation on hold pending probable surgery.   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 Patient with an episode of right upper extremity jerking. Patient seen by PCP sent for MRI could nottolerate MRI due to claustrophobia. Due to worsening symptoms patient presented to the ED. CT head showed frontal mass with vasogenic edema. MRI: Firm and solitary left frontoparietal mass with vasogenic edema. Negative for infarct. Patient given a dose of Decadron 10 mg IV and Keppra 1000 mg IV. Anticoagulation on hold. Neurosurgery consulted and recommended holding anticoagulation in anticipation of possible surgery. Continue current dose of Decadron and Keppra. Per neurosurgery.  #2 steroid-dependent COPD Currently stable. Continue Decadron, inhalers, nebs as needed.  #3 paroxysmal atrial fibrillation CHA2DS2VASC AT LEAST 3 Currently rate controlled on Cardizem. Anticoagulation on hold.  #4 history of lung cancer Per patient solitary tumor status post resection 1999. Patient has been followed closely for 10 years with no evidence of recurrence.  #5  chronic kidney disease stage II Stable. Follow.  # 6 probable focal seizure Patient had presented with an episode of upper extremity jerking likely probable focal seizure secondary to problem #1. No further episodes. Continue Keppra for seizure prophylaxis.     DVT prophylaxis: SCDs Code Status: full Family Communication: updated patient. No family present. Disposition Plan: skilled nursing facility versus home with home health pending hospitalization and probable surgery.   Consultants:   Neurosurgery: Dr. Christella Noa 12/26/2016  Procedures:   MRI head 12/26/2016  CT head 12/26/2016  Antimicrobials:   none   Subjective: Patient states right lower extremity weakness with no significant improvement. Patient with right upper extremity weakness. No chest pain. No shortness of breath. No bleeding. Patient denies any further upper extremity jerking.  Objective: Vitals:   12/27/16 1630 12/27/16 2032 12/27/16 2034 12/28/16 0448  BP: (!) 146/61   (!) 150/58  Pulse: 73   (!) 56  Resp:    20  Temp: 98 F (36.7 C)   97.8 F (36.6 C)  TempSrc: Oral   Oral  SpO2: 96% 95% 95% 97%  Weight:      Height:        Intake/Output Summary (Last 24 hours) at 12/28/16 0809 Last data filed at 12/27/16 1337  Gross per 24 hour  Intake                0 ml  Output              200 ml  Net             -200 ml   [employer]   12/27/16 0014 12/27/16 0552  Weight: 86.4 kg (190 lb 7.6 oz) 86.3 kg (190 lb 4.1 oz)  Examination:  General exam: Appears calm and comfortable  Respiratory system: Clear to auscultation. Respiratory effort normal. Cardiovascular system: S1 & S2 heard, RRR. No JVD, murmurs, rubs, gallops or clicks. No pedal edema. Gastrointestinal system: Abdomen is nondistended, soft and nontender. No organomegaly or masses felt. Normal bowel sounds heard. Central nervous system: Alert and oriented. Right upper and right lower extremity weakness. Some numbness right upper  extremity. Extremities: Symmetric 5 x 5 power. Skin: No rashes, lesions or ulcers Psychiatry: Judgement and insight appear normal. Mood & affect appropriate.     Data Reviewed: I have personally reviewed following labs and imaging studies  CBC:  Recent Labs Lab 12/26/16 1413 12/26/16 1434 12/27/16 0515 12/28/16 0530  WBC 10.1  --  7.2 11.2*  NEUTROABS 9.4*  --   --   --   HGB 12.8 13.6 11.9* 11.2*  HCT 40.9 40.0 38.6 35.1*  MCV 94.5  --  93.2 92.1  PLT 156  --  143* 299*   Basic Metabolic Panel:  Recent Labs Lab 12/26/16 1413 12/26/16 1434 12/27/16 0515  NA 142 143 141  K 3.9 3.9 4.0  CL 108 105 107  CO2 25  --  25  GLUCOSE 126* 123* 162*  BUN 11 15 16   CREATININE 1.05* 1.10* 0.86  CALCIUM 9.7  --  9.6   GFR: Estimated Creatinine Clearance: 60.4 mL/min (by C-G formula based on SCr of 0.86 mg/dL). Liver Function Tests:  Recent Labs Lab 12/26/16 1413  AST 24  ALT 14  ALKPHOS 63  BILITOT 0.6  PROT 7.0  ALBUMIN 4.0   No results for input(s): LIPASE, AMYLASE in the last 168 hours. No results for input(s): AMMONIA in the last 168 hours. Coagulation Profile:  Recent Labs Lab 12/26/16 1413  INR 0.94   Cardiac Enzymes: No results for input(s): CKTOTAL, CKMB, CKMBINDEX, TROPONINI in the last 168 hours. BNP (last 3 results) No results for input(s): PROBNP in the last 8760 hours. HbA1C: No results for input(s): HGBA1C in the last 72 hours. CBG: No results for input(s): GLUCAP in the last 168 hours. Lipid Profile: No results for input(s): CHOL, HDL, LDLCALC, TRIG, CHOLHDL, LDLDIRECT in the last 72 hours. Thyroid Function Tests: No results for input(s): TSH, T4TOTAL, FREET4, T3FREE, THYROIDAB in the last 72 hours. Anemia Panel: No results for input(s): VITAMINB12, FOLATE, FERRITIN, TIBC, IRON, RETICCTPCT in the last 72 hours. Sepsis Labs: No results for input(s): PROCALCITON, LATICACIDVEN in the last 168 hours.  No results found for this or any  previous visit (from the past 240 hour(s)).       Radiology Studies: Ct Head Wo Contrast  Result Date: 12/26/2016 CLINICAL DATA:  RIGHT-sided weakness for several weeks EXAM: CT HEAD WITHOUT CONTRAST TECHNIQUE: Contiguous axial images were obtained from the base of the skull through the vertex COMPARISON:  None. FINDINGS: Brain: High-density ovoid mass in the high LEFT cerebral hemisphere / frontal lobe measures 3.7 x 2.8 by 3.1 cm (volume = 17 cm^3). There is vasogenic edema surrounding this ovoid mass. No extra-axial fluid collections. No intraventricular hemorrhage. No midline shift or mass effect. Basal cisterns are patent. Vascular: No hyperdense vessel or unexpected calcification. Skull: Normal. Negative for fracture or focal lesion. Sinuses/Orbits: Paranasal sinuses and mastoid air cells are clear. Orbits are clear. Other: None. IMPRESSION: 1. Large ovoid mass in the high LEFT frontal lobe with surrounding vasogenic edema is favored an intra-axial neoplasm. Parenchymal hematoma or meningioma are less favored. Recommend brain MRI with contrast for further evaluation.  2. No extra-axial fluid collections. 3. No worrisome mass effect. Findings conveyed toDANIELLE RAY on 12/26/2016  at15:40. Electronically Signed   By: Suzy Bouchard M.D.   On: 12/26/2016 15:41   Mr Brain W And Wo Contrast  Result Date: 12/26/2016 CLINICAL DATA:  Seizure. Right-sided weakness. On Eliquis for atrial fibrillation. History of lung cancer 19 years ago. EXAM: MRI HEAD WITHOUT AND WITH CONTRAST TECHNIQUE: Multiplanar, multiecho pulse sequences of the brain and surrounding structures were obtained without and with intravenous contrast. CONTRAST:  20 mL MultiHance IV COMPARISON:  CT head 12/26/2016 FINDINGS: Brain: Image quality degraded by motion. Enhancing mass lesion in the high left frontal parietal lobe measuring 3.8 x 2.8 x 4.2 cm. The mass is hyperdense on CT with a mild amount of susceptibility on MRI. This could be  calcification or hemorrhage. The mass is well-circumscribed and solid. Mild surrounding vasogenic edema. The mass extends to the dural surface. It is difficult determine if this is intra-axial or extra-axial. No other mass lesion. Atrophy and chronic white matter ischemia. 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 No acute infarct. Ventricle size normal. No shift of the midline structures. Vascular: Normal arterial flow void Skull and upper cervical spine: Negative Sinuses/Orbits: Bilateral cataract removal. Paranasal sinuses clear. Other: None IMPRESSION: Image quality degraded by motion. Solitary mass in the left frontal parietal convexity with surrounding edema. It is difficult determine if this is intra-axial or extra-axial however the mass does extend to the dural surface. Possible meningioma. Metastatic disease possible. Atrophy and chronic microvascular ischemic change. No acute infarct. Electronically Signed   By: Franchot Gallo M.D.   On: 12/26/2016 19:03        Scheduled Meds: . arformoterol  15 mcg Nebulization BID  . calcium-vitamin D  1 tablet Oral QHS  . dexamethasone  4 mg Oral Q6H  . diltiazem  240 mg Oral QHS  . ipratropium-albuterol  3 mL Nebulization TID  . levETIRAcetam  500 mg Oral BID  . multivitamin with minerals  1 tablet Oral Daily  . pantoprazole  40 mg Oral BID AC   Continuous Infusions: . heparin 1,050 Units/hr (12/27/16 2048)     LOS: 2 days    Time spent: 35 mins    THOMPSON,DANIEL, MD [facility] Pager [phone]  If 7PM-7AM, please contact night-coverage [email or web address] Password [email or web address] 12/28/2016, 8:09 AM

## 2016-12-28 NOTE — Progress Notes (Signed)
Patient ID: Tracy Bridges, female   DOB: Jul 12, 1940, 76 y.o.   MRN: [id number] BP (!) 137/49 (BP Location: Right Arm)   Pulse 72   Temp 97.8 F (36.6 C) (Oral)   Resp 20   Ht 5\' 5"  (1.651 m)   Wt 86.3 kg (190 lb 4.1 oz)   SpO2 97%   BMI 31.66 kg/m  Alert and oriented x 4 Moving all extremities well CT tomorrow OR for Thursday.

## 2016-12-29 ENCOUNTER — Inpatient Hospital Stay (HOSPITAL_COMMUNITY): Payer: Medicare HMO

## 2016-12-29 LAB — BASIC METABOLIC PANEL
Anion gap: 11 (ref 5–15)
BUN: 28 mg/dL — AB (ref 6–20)
CALCIUM: 9.6 mg/dL (ref 8.9–10.3)
CO2: 26 mmol/L (ref 22–32)
CREATININE: 1.17 mg/dL — AB (ref 0.44–1.00)
Chloride: 103 mmol/L (ref 101–111)
GFR calc Af Amer: 51 mL/min — ABNORMAL LOW (ref >60)
GFR, EST NON AFRICAN AMERICAN: 44 mL/min — AB (ref >60)
GLUCOSE: 158 mg/dL — AB (ref 65–99)
Potassium: 4.1 mmol/L (ref 3.5–5.1)
Sodium: 140 mmol/L (ref 135–145)

## 2016-12-29 LAB — CBC
HEMATOCRIT: 34.5 % — AB (ref 36.0–46.0)
HEMOGLOBIN: 10.9 g/dL — AB (ref 12.0–15.0)
MCH: 29.1 pg (ref 26.0–34.0)
MCHC: 31.6 g/dL (ref 30.0–36.0)
MCV: 92 fL (ref 78.0–100.0)
Platelets: 139 10*3/uL — ABNORMAL LOW (ref 150–400)
RBC: 3.75 MIL/uL — AB (ref 3.87–5.11)
RDW: 14.8 % (ref 11.5–15.5)
WBC: 10.8 10*3/uL — AB (ref 4.0–10.5)

## 2016-12-29 LAB — HEPARIN LEVEL (UNFRACTIONATED)
HEPARIN UNFRACTIONATED: 1.32 [IU]/mL — AB (ref 0.30–0.70)
Heparin Unfractionated: 1.34 IU/mL — ABNORMAL HIGH (ref 0.30–0.70)

## 2016-12-29 LAB — GLUCOSE, CAPILLARY: GLUCOSE-CAPILLARY: 163 mg/dL — AB (ref 65–99)

## 2016-12-29 MED ORDER — IOPAMIDOL (ISOVUE-300) INJECTION 61%
INTRAVENOUS | Status: AC
  Administered 2016-12-29: 75 mL
  Filled 2016-12-29: qty 75

## 2016-12-29 MED ORDER — HEPARIN (PORCINE) IN NACL 100-0.45 UNIT/ML-% IJ SOLN
700.0000 [IU]/h | INTRAMUSCULAR | Status: DC
  Administered 2016-12-29: 900 [IU]/h via INTRAVENOUS
  Filled 2016-12-29: qty 250

## 2016-12-29 MED ORDER — SODIUM CHLORIDE 0.9% FLUSH
10.0000 mL | INTRAVENOUS | Status: DC | PRN
  Administered 2016-12-30 – 2017-01-06 (×4): 10 mL
  Filled 2016-12-29 (×4): qty 40

## 2016-12-29 MED ORDER — HEPARIN (PORCINE) IN NACL 100-0.45 UNIT/ML-% IJ SOLN
1150.0000 [IU]/h | INTRAMUSCULAR | Status: DC
  Administered 2016-12-29: 1150 [IU]/h via INTRAVENOUS

## 2016-12-29 NOTE — Progress Notes (Signed)
Patient ID: Tracy Bridges, female   DOB: [birth year], 76 y.o.   MRN: [id number] BP (!) 131/45 (BP Location: Right Arm)   Pulse 74   Temp 98.2 F (36.8 C) (Oral)   Resp 18   Ht 5\' 5"  (1.651 m)   Wt 87.2 kg (192 lb 3.2 oz)   SpO2 100%   BMI 31.98 kg/m  Or tomorrow Risks and benefits explained, stroke coma, death, brain damage, inability to remove the tumor, bleeding, infection, and other risks. She understands and wishes to proceed.  Weakness on right side, decreased sensation right side.

## 2016-12-29 NOTE — Care Management Note (Signed)
Case Management Note  Patient Details  Name: Tracy Bridges MRN: [id number] Date of Birth: [birth year]  Subjective/Objective:       Admitted with brain mass,hx of paroxysmal atrial fibrillation on Eliquis, steroid dependent COPD, and history of a solitary lung tumor status post resection in 1999. From home with husband.    Leondra Cullin (Spouse)      [phone]      PCP: Sherrilee Gilles  Action/Plan: Plan :  surgery, 9/27.Marland Kitchen... CM will continue to folow for disposition needs.  Expected Discharge Date:                  Expected Discharge Plan:  Home/Self Care  In-House Referral:     Discharge planning Services  CM Consult  Post Acute Care Choice:    Choice offered to:     DME Arranged:    DME Agency:     HH Arranged:    HH Agency:     Status of Service:  In process, will continue to follow  If discussed at Long Length of Stay Meetings, dates discussed:    Additional Comments:  Sharin Mons, RN 12/29/2016, 10:48 AM

## 2016-12-29 NOTE — Progress Notes (Signed)
ANTICOAGULATION CONSULT NOTE - [place] for Heparin Indication: atrial fibrillation  Allergies  Allergen Reactions  . Aspirin Shortness Of Breath  . Atenolol Swelling    Throat swelled and closed up  . Boniva [Ibandronic Acid] Other (See Comments)    Pt does not remember specific reaction  . Fosamax [Alendronate Sodium] Other (See Comments)    Pt does not remember specific reaction  . Latex Swelling and Rash    Throat swelled and closed up  . Mucinex [Guaifenesin Er] Anaphylaxis  . Rocephin [Ceftriaxone Sodium In Dextrose] Anaphylaxis and Rash  . Rotateq [Rotavirus Vaccine Live Oral] Other (See Comments)    "poorly tolerated"  . Sulfa Antibiotics Other (See Comments)    Pt does not remember specific reaction  . Vancomycin Other (See Comments)    Pt does not remember specific reaction  . Penicillins Rash    Childhood reaction Has patient had a PCN reaction causing immediate rash, facial/tongue/throat swelling, SOB or lightheadedness with hypotension: Yes Has patient had a PCN reaction causing severe rash involving mucus membranes or skin necrosis: No Has patient had a PCN reaction that required hospitalization: Unknown Has patient had a PCN reaction occurring within the last 10 years: No If all of the above answers are "NO", then may proceed with Cephalosporin use.  . Tobramycin Sulfate Rash    Patient Measurements: Height: 5\' 5"  (165.1 cm) Weight: 192 lb 3.2 oz (87.2 kg) IBW/kg (Calculated) : 57 Heparin Dosing Weight: 75.8  Vital Signs: Temp: 98.2 F (36.8 C) (09/26 1407) Temp Source: Oral (09/26 1407) BP: 131/45 (09/26 1407) Pulse Rate: 74 (09/26 1407)  Labs:  Recent Labs  12/27/16 0515  12/28/16 0530 12/28/16 1121 12/28/16 1813 12/29/16 0425 12/29/16 1614  HGB 11.9*  --  11.2*  --   --  10.9*  --   HCT 38.6  --  35.1*  --   --  34.5*  --   PLT 143*  --  145*  --   --  139*  --   HEPARINUNFRC  --   < > <0.10*  --  <0.10* 1.34*  1.32*  CREATININE 0.86  --   --  1.27*  --  1.17*  --   < > = values in this interval not displayed.  Estimated Creatinine Clearance: 44.6 mL/min (A) (by C-G formula based on SCr of 1.17 mg/dL (H)).   Medical History: Past Medical History:  Diagnosis Date  . Asthma   . Atrial fibrillation ([place])   . Emphysema/COPD ([place])   . Lung cancer ([place])     Medications:  Scheduled:  . arformoterol  15 mcg Nebulization BID  . budesonide (PULMICORT) nebulizer solution  0.5 mg Nebulization BID  . calcium-vitamin D  1 tablet Oral QHS  . dexamethasone  4 mg Oral Q6H  . diltiazem  240 mg Oral QHS  . fluticasone  2 spray Each Nare Daily  . ipratropium-albuterol  3 mL Nebulization TID  . levETIRAcetam  500 mg Oral BID  . multivitamin with minerals  1 tablet Oral Daily  . pantoprazole  40 mg Oral BID AC    Assessment: 76 yo F admitted for right sided numbness. Plan for possible craniotomy on Thursday per neurosurgery. Patient has a history of paroxysmal atrial fibrillation and notes indicate on Eliquis. Medication is not on prior to admission list and upon conversation with patient, unclear if taking at home.  Confirmed with outpatient pharmacy, no prescriptions ever filled for Eliquis (Plavix only).  Heparin level <0.1 also confirms no Eliquis currently in system.  9/26 PM: Heparin level remains elevated at 1.32 this evening, previous undetectable heparin levels were likely due to bad IV site which has now been switched   Goal of Therapy:  Heparin level 0.3-0.7 units/ml Monitor platelets by anticoagulation protocol: Yes   Plan:  -Hold heparin x 1 hr -Re-start heparin at 900 units/hr at 1900 -check 8 hour heparin level  Thank you for allowing [place] to participate in this patients care.  Jens Som, PharmD Clinical phone for 12/29/2016 from 3:30-10:30p: [phone] If after 10:30p, please call main pharmacy at: [phone] 12/29/2016 6:00 PM

## 2016-12-29 NOTE — Progress Notes (Signed)
ANTICOAGULATION CONSULT NOTE - [place] for Heparin Indication: atrial fibrillation  Allergies  Allergen Reactions  . Aspirin Shortness Of Breath  . Atenolol Swelling    Throat swelled and closed up  . Boniva [Ibandronic Acid] Other (See Comments)    Pt does not remember specific reaction  . Fosamax [Alendronate Sodium] Other (See Comments)    Pt does not remember specific reaction  . Latex Swelling and Rash    Throat swelled and closed up  . Mucinex [Guaifenesin Er] Anaphylaxis  . Rocephin [Ceftriaxone Sodium In Dextrose] Anaphylaxis and Rash  . Rotateq [Rotavirus Vaccine Live Oral] Other (See Comments)    "poorly tolerated"  . Sulfa Antibiotics Other (See Comments)    Pt does not remember specific reaction  . Vancomycin Other (See Comments)    Pt does not remember specific reaction  . Penicillins Rash    Childhood reaction Has patient had a PCN reaction causing immediate rash, facial/tongue/throat swelling, SOB or lightheadedness with hypotension: Yes Has patient had a PCN reaction causing severe rash involving mucus membranes or skin necrosis: No Has patient had a PCN reaction that required hospitalization: Unknown Has patient had a PCN reaction occurring within the last 10 years: No If all of the above answers are "NO", then may proceed with Cephalosporin use.  . Tobramycin Sulfate Rash    Patient Measurements: Height: 5\' 5"  (165.1 cm) Weight: 192 lb 3.2 oz (87.2 kg) IBW/kg (Calculated) : 57 Heparin Dosing Weight: 75.8  Vital Signs: Temp: 97.4 F (36.3 C) (09/26 0549) Temp Source: Oral (09/26 0549) BP: 127/42 (09/26 0549) Pulse Rate: 55 (09/26 0549)  Labs:  Recent Labs  12/26/16 1413 12/26/16 1434 12/27/16 0515 12/28/16 0530 12/28/16 1121 12/28/16 1813 12/29/16 0425  HGB 12.8 13.6 11.9* 11.2*  --   --   --   HCT 40.9 40.0 38.6 35.1*  --   --   --   PLT 156  --  143* 145*  --   --   --   APTT 25  --   --   --   --   --   --    LABPROT 12.5  --   --   --   --   --   --   INR 0.94  --   --   --   --   --   --   HEPARINUNFRC  --   --   --  <0.10*  --  <0.10* 1.34*  CREATININE 1.05* 1.10* 0.86  --  1.27*  --  1.17*    Estimated Creatinine Clearance: 44.6 mL/min (A) (by C-G formula based on SCr of 1.17 mg/dL (H)).   Medical History: Past Medical History:  Diagnosis Date  . Asthma   . Atrial fibrillation ([place])   . Emphysema/COPD ([place])   . Lung cancer ([place])     Medications:  Scheduled:  . arformoterol  15 mcg Nebulization BID  . budesonide (PULMICORT) nebulizer solution  0.5 mg Nebulization BID  . calcium-vitamin D  1 tablet Oral QHS  . dexamethasone  4 mg Oral Q6H  . diltiazem  240 mg Oral QHS  . fluticasone  2 spray Each Nare Daily  . ipratropium-albuterol  3 mL Nebulization TID  . levETIRAcetam  500 mg Oral BID  . multivitamin with minerals  1 tablet Oral Daily  . pantoprazole  40 mg Oral BID AC    Assessment: 76 yo F admitted for right sided numbness. Plan for possible  craniotomy on Thursday per neurosurgery. Patient has a history of paroxysmal atrial fibrillation and notes indicate on Eliquis. Medication is not on prior to admission list and upon conversation with patient, unclear if taking at home.  Confirmed with outpatient pharmacy, no prescriptions ever filled for Eliquis (Plavix only).  Heparin level <0.1 also confirms no Eliquis currently in system.  9/26 AM: Heparin level elevated at 1.34 this AM, previous undetectable heparin levels were likely due to bad IV site which has now been switched   Goal of Therapy:  Heparin level 0.3-0.7 units/ml Monitor platelets by anticoagulation protocol: Yes   Plan:  -Hold heparin x 1 hr -Re-start heparin at 1150 units/hr at 0830 -[place], PharmD, [place] Pharmacist Phone: [phone]

## 2016-12-29 NOTE — Progress Notes (Signed)
Pt resting in bed quietly. Easily aroused. Heparin noted to infuse with infiltration of lfa noted. Iv removed and tip intact. New iv initiated in rua and patent. Able to make needs known. Husband at bedside and supportive. Received t/c from pharmacy requesting to hold heparin this am 0720 due to collected levels. Safety maintained. Will continue to monitor.

## 2016-12-29 NOTE — Progress Notes (Signed)
PROGRESS NOTE    Tracy Bridges  DZH:[id number] DOB: June 15, 1940 DOA: 12/26/2016 PCP: Sherrilee Gilles, DO    Brief Narrative: Patient is a 76 year old female history of paroxysmal atrial fibrillation on anticoagulation, steroid-dependent COPD, solitary lung tumor status post resection 1999 presented to the ED with several weeks of right-sided numbness and weakness as well as right upper extremity jerking and right lower extremity weakness.CT MRI obtained consistent with a brain mass. Patient given Decadron and Keppra. Neurosurgery consulted.   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 Repeat CT today and plan for OR this Thursday.  Resume decadron and keppra.    Steroid dependent COPD:  Resume brovana and pulmicort. Resume bronchodilators as needed.    Paroxysmal atrial fib with CHAD2 VASC 3.  Rate controlled with cardizem.  On IV heparin gtt.    H/o Lung cancer: S/p resection in 1999.    Stage 2 CKD:  STABLE.   Focal seizures:  Resume keppra for seizures.     DVT prophylaxis: heparin. Code Status: full code.  Family Communication: family at bedside.  Disposition Plan: pending surgery tomorrow.   Consultants:   Neurosurgery.   Procedures: (MRI brain .  Antimicrobials: none.   Subjective: Occasional headache.   Objective: Vitals:   12/29/16 0549 12/29/16 0859 12/29/16 1339 12/29/16 1407  BP: (!) 127/42   (!) 131/45  Pulse: (!) 55 60 68 74  Resp: 18 18 18 18   Temp: (!) 97.4 F (36.3 C)   98.2 F (36.8 C)  TempSrc: Oral   Oral  SpO2: 97% 97% 91% 100%  Weight:      Height:        Intake/Output Summary (Last 24 hours) at 12/29/16 1740 Last data filed at  12/29/16 0600  Gross per 24 hour  Intake              300 ml  Output              425 ml  Net             -125 ml   Filed Weights   12/27/16 0014 12/27/16 0552 12/29/16 0500  Weight: 86.4 kg (190 lb 7.6 oz) 86.3 kg (190 lb 4.1 oz) 87.2 kg (192 lb 3.2 oz)    Examination:  General exam: Appears calm and comfortable  Respiratory system: Clear to auscultation. Respiratory effort normal. Cardiovascular system: S1 & S2 heard, RRR. No JVD, murmurs, rubs, gallops or clicks. No pedal edema. Gastrointestinal system: Abdomen is nondistended, soft and nontender. No organomegaly or masses felt. Normal bowel sounds heard. Central nervous system: Alert and oriented.right upper and lower extremity weakness improving.  Extremities: Symmetric 5 x 5 power. Skin: No rashes, lesions or ulcers Psychiatry: Judgement and insight appear normal. Mood & affect appropriate.     Data Reviewed: I have personally reviewed following labs and imaging studies  CBC:  Recent Labs Lab 12/26/16 1413 12/26/16 1434 12/27/16 0515 12/28/16 0530 12/29/16 0425  WBC 10.1  --  7.2 11.2* 10.8*  NEUTROABS 9.4*  --   --   --   --   HGB 12.8 13.6 11.9* 11.2* 10.9*  HCT 40.9 40.0 38.6 35.1* 34.5*  MCV 94.5  --  93.2 92.1 92.0  PLT 156  --  143* 145* 876*   Basic Metabolic Panel:  Recent Labs Lab 12/26/16 1413 12/26/16 1434 12/27/16 0515 12/28/16 1121 12/29/16 0425  NA 142 143 141 141 140  K 3.9 3.9 4.0 3.7 4.1  CL 108 105 107 105 103  CO2 25  --  25 22 26   GLUCOSE 126* 123* 162* 191* 158*  BUN 11 15 16  22* 28*  CREATININE 1.05* 1.10* 0.86 1.27* 1.17*  CALCIUM 9.7  --  9.6 10.0 9.6   GFR: Estimated Creatinine Clearance: 44.6 mL/min (A) (by C-G formula based on SCr of 1.17 mg/dL (H)). Liver Function Tests:  Recent Labs Lab 12/26/16 1413  AST 24  ALT 14  ALKPHOS 63  BILITOT 0.6  PROT 7.0  ALBUMIN 4.0   No results for input(s): LIPASE, AMYLASE in the last 168 hours. No results for input(s):  AMMONIA in the last 168 hours. Coagulation Profile:  Recent Labs Lab 12/26/16 1413  INR 0.94   Cardiac Enzymes: No results for input(s): CKTOTAL, CKMB, CKMBINDEX, TROPONINI in the last 168 hours. BNP (last 3 results) No results for input(s): PROBNP in the last 8760 hours. HbA1C: No results for input(s): HGBA1C in the last 72 hours. CBG:  Recent Labs Lab 12/28/16 0835 12/29/16 0716  GLUCAP 151* 163*   Lipid Profile: No results for input(s): CHOL, HDL, LDLCALC, TRIG, CHOLHDL, LDLDIRECT in the last 72 hours. Thyroid Function Tests: No results for input(s): TSH, T4TOTAL, FREET4, T3FREE, THYROIDAB in the last 72 hours. Anemia Panel: No results for input(s): VITAMINB12, FOLATE, FERRITIN, TIBC, IRON, RETICCTPCT in the last 72 hours. Sepsis Labs: No results for input(s): PROCALCITON, LATICACIDVEN in the last 168 hours.  No results found for this or any previous visit (from the past 240 hour(s)).       Radiology Studies: No results found.      Scheduled Meds: . arformoterol  15 mcg Nebulization BID  . budesonide (PULMICORT) nebulizer solution  0.5 mg Nebulization BID  . calcium-vitamin D  1 tablet Oral QHS  . dexamethasone  4 mg Oral Q6H  . diltiazem  240 mg Oral QHS  . fluticasone  2 spray Each Nare Daily  . ipratropium-albuterol  3 mL Nebulization TID  . levETIRAcetam  500 mg Oral BID  . multivitamin with minerals  1 tablet Oral Daily  . pantoprazole  40 mg Oral BID AC   Continuous Infusions: . heparin 1,150 Units/hr (12/29/16 0924)     LOS: 3 days    Time spent: 35 minutes.     Hosie Poisson, MD [facility] Pager [phone]  If 7PM-7AM, please contact night-coverage [email or web address] Password [facility] 12/29/2016, 5:40 PM

## 2016-12-30 ENCOUNTER — Inpatient Hospital Stay (HOSPITAL_COMMUNITY): Payer: Medicare HMO | Admitting: Certified Registered Nurse Anesthetist

## 2016-12-30 ENCOUNTER — Inpatient Hospital Stay (HOSPITAL_COMMUNITY): Payer: Medicare HMO

## 2016-12-30 ENCOUNTER — Encounter (HOSPITAL_COMMUNITY)
Admission: EM | Disposition: A | Discharge status: Rehabilitation Facility | Payer: Self-pay | Source: Home / Self Care | Attending: Neurosurgery

## 2016-12-30 DIAGNOSIS — D496 Neoplasm of unspecified behavior of brain: Secondary | ICD-10-CM | POA: Diagnosis present

## 2016-12-30 HISTORY — PX: APPLICATION OF CRANIAL NAVIGATION: SHX6578

## 2016-12-30 HISTORY — PX: CRANIOTOMY: SHX93

## 2016-12-30 LAB — CBC
HCT: 34.4 % — ABNORMAL LOW (ref 36.0–46.0)
Hemoglobin: 10.9 g/dL — ABNORMAL LOW (ref 12.0–15.0)
MCH: 29.1 pg (ref 26.0–34.0)
MCHC: 31.7 g/dL (ref 30.0–36.0)
MCV: 92 fL (ref 78.0–100.0)
PLATELETS: 140 10*3/uL — AB (ref 150–400)
RBC: 3.74 MIL/uL — AB (ref 3.87–5.11)
RDW: 14.6 % (ref 11.5–15.5)
WBC: 11.7 10*3/uL — AB (ref 4.0–10.5)

## 2016-12-30 LAB — GLUCOSE, CAPILLARY: Glucose-Capillary: 138 mg/dL — ABNORMAL HIGH (ref 65–99)

## 2016-12-30 LAB — HEPARIN LEVEL (UNFRACTIONATED): HEPARIN UNFRACTIONATED: 0.81 [IU]/mL — AB (ref 0.30–0.70)

## 2016-12-30 LAB — MRSA PCR SCREENING: MRSA by PCR: NEGATIVE

## 2016-12-30 SURGERY — CRANIOTOMY TUMOR EXCISION
Anesthesia: General | Site: Head | Laterality: Left

## 2016-12-30 MED ORDER — LIDOCAINE 2% (20 MG/ML) 5 ML SYRINGE
INTRAMUSCULAR | Status: DC | PRN
  Administered 2016-12-30: 60 mg via INTRAVENOUS

## 2016-12-30 MED ORDER — HEMOSTATIC AGENTS (NO CHARGE) OPTIME
TOPICAL | Status: DC | PRN
  Administered 2016-12-30: 1 via TOPICAL

## 2016-12-30 MED ORDER — SENNA 8.6 MG PO TABS
1.0000 | ORAL_TABLET | Freq: Two times a day (BID) | ORAL | Status: DC
  Administered 2016-12-30 – 2017-01-07 (×16): 8.6 mg via ORAL
  Filled 2016-12-30 (×16): qty 1

## 2016-12-30 MED ORDER — THROMBIN 20000 UNITS EX SOLR
CUTANEOUS | Status: AC
  Filled 2016-12-30: qty 20000

## 2016-12-30 MED ORDER — DEXAMETHASONE 6 MG PO TABS
6.0000 mg | ORAL_TABLET | Freq: Four times a day (QID) | ORAL | Status: AC
  Administered 2016-12-30 – 2016-12-31 (×4): 6 mg via ORAL
  Filled 2016-12-30 (×4): qty 1

## 2016-12-30 MED ORDER — DILTIAZEM HCL ER COATED BEADS 240 MG PO CP24
240.0000 mg | ORAL_CAPSULE | Freq: Every day | ORAL | Status: DC
  Administered 2016-12-30 – 2017-01-07 (×9): 240 mg via ORAL
  Filled 2016-12-30 (×10): qty 1

## 2016-12-30 MED ORDER — LIDOCAINE-EPINEPHRINE 0.5 %-1:200000 IJ SOLN
INTRAMUSCULAR | Status: DC | PRN
  Administered 2016-12-30: 10 mL

## 2016-12-30 MED ORDER — CLINDAMYCIN PHOSPHATE 900 MG/50ML IV SOLN
900.0000 mg | INTRAVENOUS | Status: AC
  Administered 2016-12-30: 900 mg via INTRAVENOUS
  Filled 2016-12-30: qty 50

## 2016-12-30 MED ORDER — FENTANYL CITRATE (PF) 250 MCG/5ML IJ SOLN
INTRAMUSCULAR | Status: DC | PRN
  Administered 2016-12-30: 50 ug via INTRAVENOUS
  Administered 2016-12-30: 150 ug via INTRAVENOUS
  Administered 2016-12-30 (×2): 50 ug via INTRAVENOUS

## 2016-12-30 MED ORDER — 0.9 % SODIUM CHLORIDE (POUR BTL) OPTIME
TOPICAL | Status: DC | PRN
Start: 2016-12-30 — End: 2016-12-30
  Administered 2016-12-30 (×3): 1000 mL

## 2016-12-30 MED ORDER — ROCURONIUM BROMIDE 10 MG/ML (PF) SYRINGE
PREFILLED_SYRINGE | INTRAVENOUS | Status: DC | PRN
  Administered 2016-12-30: 20 mg via INTRAVENOUS
  Administered 2016-12-30 (×2): 50 mg via INTRAVENOUS
  Administered 2016-12-30: 20 mg via INTRAVENOUS

## 2016-12-30 MED ORDER — LABETALOL HCL 5 MG/ML IV SOLN: 10.0000 mg | INTRAVENOUS | Status: DC | PRN

## 2016-12-30 MED ORDER — LORAZEPAM 2 MG/ML IJ SOLN
2.0000 mg | Freq: Once | INTRAMUSCULAR | Status: AC
  Administered 2016-12-30: 2 mg via INTRAVENOUS
  Filled 2016-12-30: qty 1

## 2016-12-30 MED ORDER — PROMETHAZINE HCL 25 MG PO TABS
12.5000 mg | ORAL_TABLET | ORAL | Status: DC | PRN
  Administered 2016-12-30: 25 mg via ORAL
  Filled 2016-12-30: qty 1

## 2016-12-30 MED ORDER — MAGNESIUM CITRATE PO SOLN: 1.0000 | Freq: Once | ORAL | Status: DC | PRN

## 2016-12-30 MED ORDER — SODIUM CHLORIDE 0.9 % IV SOLN
500.0000 mg | INTRAVENOUS | Status: AC
  Administered 2016-12-30: 500 mg via INTRAVENOUS
  Filled 2016-12-30: qty 5

## 2016-12-30 MED ORDER — MORPHINE SULFATE (PF) 4 MG/ML IV SOLN
1.0000 mg | INTRAVENOUS | Status: DC | PRN
  Filled 2016-12-30: qty 1

## 2016-12-30 MED ORDER — LABETALOL HCL 5 MG/ML IV SOLN
INTRAVENOUS | Status: DC | PRN
  Administered 2016-12-30: 5 mg via INTRAVENOUS

## 2016-12-30 MED ORDER — DEXAMETHASONE 4 MG PO TABS
4.0000 mg | ORAL_TABLET | Freq: Four times a day (QID) | ORAL | Status: AC
  Administered 2016-12-31 – 2017-01-01 (×4): 4 mg via ORAL
  Filled 2016-12-30 (×4): qty 1

## 2016-12-30 MED ORDER — ACETAMINOPHEN 325 MG PO TABS
650.0000 mg | ORAL_TABLET | ORAL | Status: DC | PRN
  Administered 2016-12-31 – 2017-01-06 (×6): 650 mg via ORAL
  Filled 2016-12-30 (×7): qty 2

## 2016-12-30 MED ORDER — ACETAMINOPHEN 650 MG RE SUPP: 650.0000 mg | RECTAL | Status: DC | PRN

## 2016-12-30 MED ORDER — SODIUM CHLORIDE 0.9 % IV SOLN
INTRAVENOUS | Status: DC
  Administered 2016-12-30: 14:00:00 via INTRAVENOUS

## 2016-12-30 MED ORDER — BACITRACIN ZINC 500 UNIT/GM EX OINT
TOPICAL_OINTMENT | CUTANEOUS | Status: DC | PRN
  Administered 2016-12-30: 1 via TOPICAL

## 2016-12-30 MED ORDER — FENTANYL CITRATE (PF) 100 MCG/2ML IJ SOLN: 25.0000 ug | INTRAMUSCULAR | Status: DC | PRN

## 2016-12-30 MED ORDER — PROPOFOL 10 MG/ML IV BOLUS
INTRAVENOUS | Status: AC
  Filled 2016-12-30: qty 20

## 2016-12-30 MED ORDER — GADOBENATE DIMEGLUMINE 529 MG/ML IV SOLN
18.0000 mL | Freq: Once | INTRAVENOUS | Status: AC | PRN
  Administered 2016-12-30: 18 mL via INTRAVENOUS

## 2016-12-30 MED ORDER — MANNITOL 25 % IV SOLN
INTRAVENOUS | Status: DC | PRN
  Administered 2016-12-30 (×3): 12.5 g via INTRAVENOUS

## 2016-12-30 MED ORDER — ALBUTEROL SULFATE HFA 108 (90 BASE) MCG/ACT IN AERS
INHALATION_SPRAY | RESPIRATORY_TRACT | Status: DC | PRN
  Administered 2016-12-30: 6 via RESPIRATORY_TRACT

## 2016-12-30 MED ORDER — HYDRALAZINE HCL 20 MG/ML IJ SOLN
INTRAMUSCULAR | Status: AC
  Filled 2016-12-30: qty 1

## 2016-12-30 MED ORDER — LIDOCAINE-EPINEPHRINE 0.5 %-1:200000 IJ SOLN
INTRAMUSCULAR | Status: AC
  Filled 2016-12-30: qty 1

## 2016-12-30 MED ORDER — FENTANYL CITRATE (PF) 250 MCG/5ML IJ SOLN
INTRAMUSCULAR | Status: AC
  Filled 2016-12-30: qty 5

## 2016-12-30 MED ORDER — PROPOFOL 10 MG/ML IV BOLUS
INTRAVENOUS | Status: DC | PRN
  Administered 2016-12-30: 130 mg via INTRAVENOUS
  Administered 2016-12-30: 70 mg via INTRAVENOUS

## 2016-12-30 MED ORDER — THROMBIN 20000 UNITS EX SOLR
CUTANEOUS | Status: DC | PRN
  Administered 2016-12-30: 16:00:00 via TOPICAL

## 2016-12-30 MED ORDER — PHENYLEPHRINE HCL 10 MG/ML IJ SOLN
INTRAVENOUS | Status: DC | PRN
  Administered 2016-12-30: 40 ug/min via INTRAVENOUS

## 2016-12-30 MED ORDER — SODIUM CHLORIDE 0.9 % IV SOLN
INTRAVENOUS | Status: DC | PRN
  Administered 2016-12-30 (×2): via INTRAVENOUS

## 2016-12-30 MED ORDER — CHLORHEXIDINE GLUCONATE CLOTH 2 % EX PADS: 6.0000 | MEDICATED_PAD | Freq: Once | CUTANEOUS | Status: DC

## 2016-12-30 MED ORDER — HYDRALAZINE HCL 20 MG/ML IJ SOLN: 5.0000 mg | INTRAMUSCULAR | Status: DC | PRN

## 2016-12-30 MED ORDER — DEXAMETHASONE SODIUM PHOSPHATE 10 MG/ML IJ SOLN
INTRAMUSCULAR | Status: AC
  Filled 2016-12-30: qty 1

## 2016-12-30 MED ORDER — NALOXONE HCL 0.4 MG/ML IJ SOLN: 0.0800 mg | INTRAMUSCULAR | Status: DC | PRN

## 2016-12-30 MED ORDER — DEXAMETHASONE 4 MG PO TABS
4.0000 mg | ORAL_TABLET | Freq: Three times a day (TID) | ORAL | Status: DC
  Administered 2017-01-02 – 2017-01-07 (×17): 4 mg via ORAL
  Filled 2016-12-30 (×17): qty 1

## 2016-12-30 MED ORDER — ALBUTEROL SULFATE HFA 108 (90 BASE) MCG/ACT IN AERS
INHALATION_SPRAY | RESPIRATORY_TRACT | Status: AC
  Filled 2016-12-30: qty 6.7

## 2016-12-30 MED ORDER — POTASSIUM CHLORIDE IN NACL 20-0.9 MEQ/L-% IV SOLN
INTRAVENOUS | Status: DC
  Administered 2016-12-30 – 2016-12-31 (×2): via INTRAVENOUS
  Filled 2016-12-30 (×4): qty 1000

## 2016-12-30 MED ORDER — DEXAMETHASONE SODIUM PHOSPHATE 10 MG/ML IJ SOLN
INTRAMUSCULAR | Status: DC | PRN
  Administered 2016-12-30: 10 mg via INTRAVENOUS

## 2016-12-30 MED ORDER — BACITRACIN ZINC 500 UNIT/GM EX OINT
TOPICAL_OINTMENT | CUTANEOUS | Status: AC
  Filled 2016-12-30: qty 28.35

## 2016-12-30 SURGICAL SUPPLY — 94 items
APL SKNCLS STERI-STRIP NONHPOA (GAUZE/BANDAGES/DRESSINGS)
BENZOIN TINCTURE PRP APPL 2/3 (GAUZE/BANDAGES/DRESSINGS) IMPLANT
BLADE CLIPPER SURG (BLADE) IMPLANT
BLADE SAW GIGLI 16 STRL (MISCELLANEOUS) IMPLANT
BLADE SURG 15 STRL LF DISP TIS (BLADE) IMPLANT
BLADE SURG 15 STRL SS (BLADE)
BLADE ULTRA TIP 2M (BLADE) IMPLANT
BNDG GAUZE ELAST 4 BULKY (GAUZE/BANDAGES/DRESSINGS) IMPLANT
BUR ACORN 6.0 PRECISION (BURR) ×2 IMPLANT
BUR ACORN 6.0MM PRECISION (BURR) ×1
BUR MATCHSTICK NEURO 3.0 LAGG (BURR) ×2 IMPLANT
BUR SPIRAL ROUTER 2.3 (BUR) IMPLANT
BUR SPIRAL ROUTER 2.3MM (BUR)
CANISTER SUCT 3000ML PPV (MISCELLANEOUS) ×3 IMPLANT
CARTRIDGE OIL MAESTRO DRILL (MISCELLANEOUS) ×2 IMPLANT
CATH VENTRIC 35X38 W/TROCAR LG (CATHETERS) IMPLANT
CLIP VESOCCLUDE MED 6/CT (CLIP) IMPLANT
CONT SPEC 4OZ CLIKSEAL STRL BL (MISCELLANEOUS) ×3 IMPLANT
DIFFUSER DRILL AIR PNEUMATIC (MISCELLANEOUS) ×6 IMPLANT
DRAIN SUBARACHNOID (WOUND CARE) IMPLANT
DRAPE CAMERA VIDEO/LASER (DRAPES) IMPLANT
DRAPE MICROSCOPE LEICA (MISCELLANEOUS) IMPLANT
DRAPE NEUROLOGICAL W/INCISE (DRAPES) ×3 IMPLANT
DRAPE ORTHO SPLIT 77X108 STRL (DRAPES)
DRAPE SURG 17X23 STRL (DRAPES) IMPLANT
DRAPE SURG ORHT 6 SPLT 77X108 (DRAPES) IMPLANT
DRAPE WARM FLUID 44X44 (DRAPE) ×3 IMPLANT
DRSG TELFA 3X8 NADH (GAUZE/BANDAGES/DRESSINGS) ×3 IMPLANT
DURAPREP 6ML APPLICATOR 50/CS (WOUND CARE) ×3 IMPLANT
ELECT REM PT RETURN 9FT ADLT (ELECTROSURGICAL) ×3
ELECTRODE REM PT RTRN 9FT ADLT (ELECTROSURGICAL) ×1 IMPLANT
EVACUATOR 1/8 PVC DRAIN (DRAIN) IMPLANT
EVACUATOR SILICONE 100CC (DRAIN) IMPLANT
FORCEPS BIPOLAR SPETZLER 8 1.0 (NEUROSURGERY SUPPLIES) ×2 IMPLANT
GAUZE SPONGE 4X4 12PLY STRL (GAUZE/BANDAGES/DRESSINGS) ×3 IMPLANT
GAUZE SPONGE 4X4 16PLY XRAY LF (GAUZE/BANDAGES/DRESSINGS) IMPLANT
GLOVE BIOGEL PI IND STRL 7.0 (GLOVE) IMPLANT
GLOVE BIOGEL PI IND STRL 7.5 (GLOVE) IMPLANT
GLOVE BIOGEL PI IND STRL 8 (GLOVE) IMPLANT
GLOVE BIOGEL PI INDICATOR 7.0 (GLOVE) ×4
GLOVE BIOGEL PI INDICATOR 7.5 (GLOVE) ×4
GLOVE BIOGEL PI INDICATOR 8 (GLOVE) ×4
GLOVE ECLIPSE 6.5 STRL STRAW (GLOVE) ×3 IMPLANT
GLOVE EXAM NITRILE LRG STRL (GLOVE) IMPLANT
GLOVE EXAM NITRILE XL STR (GLOVE) IMPLANT
GLOVE EXAM NITRILE XS STR PU (GLOVE) IMPLANT
GLOVE SS N UNI LF 6.5 STRL (GLOVE) ×4 IMPLANT
GLOVE SS N UNI LF 7.0 STRL (GLOVE) ×4 IMPLANT
GLOVE SS N UNI LF 7.5 STRL (GLOVE) ×4 IMPLANT
GOWN STRL REUS W/ TWL LRG LVL3 (GOWN DISPOSABLE) ×2 IMPLANT
GOWN STRL REUS W/ TWL XL LVL3 (GOWN DISPOSABLE) IMPLANT
GOWN STRL REUS W/TWL 2XL LVL3 (GOWN DISPOSABLE) IMPLANT
GOWN STRL REUS W/TWL LRG LVL3 (GOWN DISPOSABLE) ×6
GOWN STRL REUS W/TWL XL LVL3 (GOWN DISPOSABLE)
GRAFT DURAGEN MATRIX 2WX2L ×2 IMPLANT
HEMOSTAT SURGICEL 2X14 (HEMOSTASIS) ×2 IMPLANT
HOOK DURA 1/2IN (MISCELLANEOUS) ×5 IMPLANT
KIT BASIN OR (CUSTOM PROCEDURE TRAY) ×3 IMPLANT
KIT DRAIN CSF ACCUDRAIN (MISCELLANEOUS) IMPLANT
KIT ROOM TURNOVER OR (KITS) ×3 IMPLANT
MARKER SPHERE PSV REFLC 13MM (MARKER) ×6 IMPLANT
NDL HYPO 25X1 1.5 SAFETY (NEEDLE) ×1 IMPLANT
NDL SPNL 18GX3.5 QUINCKE PK (NEEDLE) IMPLANT
NEEDLE HYPO 25X1 1.5 SAFETY (NEEDLE) ×3 IMPLANT
NEEDLE SPNL 18GX3.5 QUINCKE PK (NEEDLE) IMPLANT
NS IRRIG 1000ML POUR BTL (IV SOLUTION) ×9 IMPLANT
OIL CARTRIDGE MAESTRO DRILL (MISCELLANEOUS) ×6
PACK CRANIOTOMY (CUSTOM PROCEDURE TRAY) ×3 IMPLANT
PATTIES SURGICAL .25X.25 (GAUZE/BANDAGES/DRESSINGS) IMPLANT
PATTIES SURGICAL .5 X.5 (GAUZE/BANDAGES/DRESSINGS) IMPLANT
PATTIES SURGICAL .5 X3 (DISPOSABLE) IMPLANT
PATTIES SURGICAL 1/4 X 3 (GAUZE/BANDAGES/DRESSINGS) IMPLANT
PATTIES SURGICAL 1X1 (DISPOSABLE) IMPLANT
PLATE 1.5  2HOLE LNG NEURO (Plate) ×4 IMPLANT
PLATE 1.5 2HOLE LNG NEURO (Plate) ×2 IMPLANT
PLATE 1.5/0.5 13MM BURR HOLE (Plate) ×2 IMPLANT
SCREW SELF DRILL HT 1.5/4MM (Screw) ×14 IMPLANT
SPONGE NEURO XRAY DETECT 1X3 (DISPOSABLE) IMPLANT
SPONGE SURGIFOAM ABS GEL 100 (HEMOSTASIS) ×3 IMPLANT
STAPLER VISISTAT 35W (STAPLE) ×3 IMPLANT
SUT ETHILON 3 0 FSL (SUTURE) IMPLANT
SUT ETHILON 3 0 PS 1 (SUTURE) IMPLANT
SUT NURALON 4 0 TR CR/8 (SUTURE) ×9 IMPLANT
SUT SILK 0 TIES 10X30 (SUTURE) IMPLANT
SUT STEEL 0 (SUTURE)
SUT STEEL 0 18XMFL TIE 17 (SUTURE) IMPLANT
SUT VIC AB 2-0 CT2 18 VCP726D (SUTURE) ×6 IMPLANT
TOWEL GREEN STERILE (TOWEL DISPOSABLE) ×3 IMPLANT
TOWEL GREEN STERILE FF (TOWEL DISPOSABLE) ×5 IMPLANT
TRAY FOLEY BAG SILVER LF 16FR (SET/KITS/TRAYS/PACK) ×3 IMPLANT
TUBE CONNECTING 12'X1/4 (SUCTIONS) ×1
TUBE CONNECTING 12X1/4 (SUCTIONS) ×2 IMPLANT
UNDERPAD 30X30 (UNDERPADS AND DIAPERS) ×7 IMPLANT
WATER STERILE IRR 1000ML POUR (IV SOLUTION) ×3 IMPLANT

## 2016-12-30 NOTE — Progress Notes (Signed)
ANTICOAGULATION CONSULT NOTE - Follow Up Consult  Pharmacy Consult for Heparin  Indication: atrial fibrillation  Allergies  Allergen Reactions  . Aspirin Shortness Of Breath  . Atenolol Swelling    Throat swelled and closed up  . Boniva [Ibandronic Acid] Other (See Comments)    Pt does not remember specific reaction  . Fosamax [Alendronate Sodium] Other (See Comments)    Pt does not remember specific reaction  . Latex Swelling and Rash    Throat swelled and closed up  . Mucinex [Guaifenesin Er] Anaphylaxis  . Rocephin [Ceftriaxone Sodium In Dextrose] Anaphylaxis and Rash  . Rotateq [Rotavirus Vaccine Live Oral] Other (See Comments)    "poorly tolerated"  . Sulfa Antibiotics Other (See Comments)    Pt does not remember specific reaction  . Vancomycin Other (See Comments)    Pt does not remember specific reaction  . Penicillins Rash    Childhood reaction Has patient had a PCN reaction causing immediate rash, facial/tongue/throat swelling, SOB or lightheadedness with hypotension: Yes Has patient had a PCN reaction causing severe rash involving mucus membranes or skin necrosis: No Has patient had a PCN reaction that required hospitalization: Unknown Has patient had a PCN reaction occurring within the last 10 years: No If all of the above answers are "NO", then may proceed with Cephalosporin use.  . Tobramycin Sulfate Rash    Patient Measurements: Height: 5\' 5"  (165.1 cm) Weight: 194 lb 3.6 oz (88.1 kg) IBW/kg (Calculated) : 57  Vital Signs: Temp: 98.1 F (36.7 C) (09/26 2200) Temp Source: Oral (09/26 2200) BP: 147/54 (09/26 2200) Pulse Rate: 67 (09/26 2200)  Labs:  Recent Labs  12/28/16 0530 12/28/16 1121  12/29/16 0425 12/29/16 1614 12/30/16 0408  HGB 11.2*  --   --  10.9*  --  10.9*  HCT 35.1*  --   --  34.5*  --  34.4*  PLT 145*  --   --  139*  --  PENDING  HEPARINUNFRC <0.10*  --   < > 1.34* 1.32* 0.81*  CREATININE  --  1.27*  --  1.17*  --   --   < > =  values in this interval not displayed.  Estimated Creatinine Clearance: 44.8 mL/min (A) (by C-G formula based on SCr of 1.17 mg/dL (H)).   Assessment: Heparin for afib, heparin level trending down but still elevated, no issues per RN, going to OR later today  Goal of Therapy:  Heparin level 0.3-0.7 units/ml Monitor platelets by anticoagulation protocol: Yes   Plan:  -Dec heparin to 700 units/hr -F/U anti-coagulation plans post-op  Narda Bonds 12/30/2016,6:15 AM

## 2016-12-30 NOTE — Progress Notes (Signed)
PROGRESS NOTE    Tracy Bridges  ZJQ:[id number] DOB: 1940-04-29 DOA: 12/26/2016 PCP: Sherrilee Gilles, DO    Brief Narrative: Patient is a 76 year old female history of paroxysmal atrial fibrillation on anticoagulation, steroid-dependent COPD, solitary lung tumor status post resection 1999 presented to the ED with several weeks of right-sided numbness and weakness as well as right upper extremity jerking and right lower extremity weakness.CT MRI obtained consistent with a brain mass. Patient given Decadron and Keppra. Neurosurgery consulted.   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 Repeat CT showed 2.8 x 3.5 x 3.5 cm LEFT frontoparietal mass concerning for metastatic disease given history of lung cancer, less likely primary glial neoplasm. Regional mass effect without midline shift. and plan for resection today. Resume decadron and keppra.    Steroid dependent COPD:  Resume brovana and pulmicort. Resume bronchodilators as needed.    Paroxysmal atrial fib with CHAD2 VASC 3.  Rate controlled with cardizem.  On IV heparin gtt.    H/o Lung cancer: S/p resection in 1999.    Stage 2 CKD:  STABLE.   Focal seizures:  Resume keppra for seizures.     DVT prophylaxis: heparin. Code Status: full code.  Family Communication: family at bedside.  Disposition Plan:pending surgery.   Consultants:   Neurosurgery.   Procedures: (MRI brain .  Antimicrobials: none.   Subjective: No new complaints.   Objective: Vitals:   12/29/16 2005 12/29/16 2200 12/30/16 0500 12/30/16 0615  BP:  (!) 147/54  (!) 140/46  Pulse:  67  67  Resp:  18  20  Temp:  98.1 F (36.7 C)  98 F (36.7 C)    TempSrc:  Oral  Oral  SpO2: 94% 91%  95%  Weight:   88.1 kg (194 lb 3.6 oz)   Height:        Intake/Output Summary (Last 24 hours) at 12/30/16 1644 Last data filed at 12/30/16 1630  Gross per 24 hour  Intake           1191.6 ml  Output             2000 ml  Net           -808.4 ml   Filed Weights   12/27/16 0552 12/29/16 0500 12/30/16 0500  Weight: 86.3 kg (190 lb 4.1 oz) 87.2 kg (192 lb 3.2 oz) 88.1 kg (194 lb 3.6 oz)    Examination:  no change  General exam: Appears calm and comfortable  Respiratory system: Clear to auscultation. Respiratory effort normal. Cardiovascular system: S1 & S2 heard, RRR. No JVD, murmurs, rubs, gallops or clicks. No pedal edema. Gastrointestinal system: Abdomen is nondistended, soft and nontender. No organomegaly or masses felt. Normal bowel sounds heard. Central nervous system: Alert and oriented.right upper and lower extremity weakness improving.  Extremities: Symmetric 5 x 5 power. Skin: No rashes, lesions or ulcers Psychiatry: Judgement and insight appear normal. Mood & affect appropriate.     Data Reviewed: I have personally reviewed following labs and imaging studies  CBC:  Recent Labs Lab 12/26/16 1413 12/26/16 1434 12/27/16 0515 12/28/16 0530 12/29/16 0425 12/30/16 0408  WBC 10.1  --  7.2 11.2* 10.8* 11.7*  NEUTROABS 9.4*  --   --   --   --   --   HGB 12.8 13.6 11.9* 11.2* 10.9* 10.9*  HCT 40.9 40.0 38.6 35.1* 34.5* 34.4*  MCV 94.5  --  93.2 92.1 92.0 92.0  PLT 156  --  143* 145* 139* 366*   Basic Metabolic Panel:  Recent Labs Lab 12/26/16 1413 12/26/16 1434 12/27/16 0515 12/28/16 1121 12/29/16 0425  NA 142 143 141 141 140  K 3.9 3.9 4.0 3.7 4.1  CL 108 105 107 105 103  CO2 25  --  25 22 26   GLUCOSE 126* 123* 162* 191* 158*  BUN 11 15 16  22* 28*  CREATININE 1.05* 1.10* 0.86 1.27* 1.17*  CALCIUM 9.7  --  9.6 10.0 9.6   GFR: Estimated Creatinine Clearance: 44.8 mL/min (A) (by C-G formula based on SCr of 1.17  mg/dL (H)). Liver Function Tests:  Recent Labs Lab 12/26/16 1413  AST 24  ALT 14  ALKPHOS 63  BILITOT 0.6  PROT 7.0  ALBUMIN 4.0   No results for input(s): LIPASE, AMYLASE in the last 168 hours. No results for input(s): AMMONIA in the last 168 hours. Coagulation Profile:  Recent Labs Lab 12/26/16 1413  INR 0.94   Cardiac Enzymes: No results for input(s): CKTOTAL, CKMB, CKMBINDEX, TROPONINI in the last 168 hours. BNP (last 3 results) No results for input(s): PROBNP in the last 8760 hours. HbA1C: No results for input(s): HGBA1C in the last 72 hours. CBG:  Recent Labs Lab 12/28/16 0835 12/29/16 0716 12/30/16 0758  GLUCAP 151* 163* 138*   Lipid Profile: No results for input(s): CHOL, HDL, LDLCALC, TRIG, CHOLHDL, LDLDIRECT in the last 72 hours. Thyroid Function Tests: No results for input(s): TSH, T4TOTAL, FREET4, T3FREE, THYROIDAB in the last 72 hours. Anemia Panel: No results for input(s): VITAMINB12, FOLATE, FERRITIN, TIBC, IRON, RETICCTPCT in the last 72 hours. Sepsis Labs: No results for input(s): PROCALCITON, LATICACIDVEN in the last 168 hours.  Recent Results (from the past 240 hour(s))  MRSA PCR Screening     Status: None   Collection Time: 12/29/16 11:35 PM  Result Value Ref Range Status   MRSA by PCR NEGATIVE NEGATIVE Final    Comment:        The GeneXpert MRSA Assay (FDA approved for NASAL specimens only), is one component of a comprehensive MRSA colonization surveillance program. It is not intended to diagnose MRSA infection nor to guide or monitor treatment for MRSA infections.          Radiology Studies: Stealth Ct Head W Contrast  Result Date: 12/29/2016 CLINICAL DATA:  Pre-surgical planning for solitary frontoparietal mass. History of lung cancer. EXAM: CT HEAD WITH CONTRAST TECHNIQUE: Contiguous axial images were obtained from the base of the skull through the vertex with intravenous contrast. CONTRAST:  62mL ISOVUE-300 IOPAMIDOL  (ISOVUE-300) INJECTION 61% COMPARISON:  CT HEAD and MRI head December 26, 2016 FINDINGS: BRAIN: 2.8 x 3.5 x 3.5 cm (transverse by AP by CC) heterogeneously enhancing intraparenchymal LEFT frontoparietal mass involving gray-white matter again noted with surrounding low-density vasogenic edema and regional mass effect. Mass does not extend to superior sagittal sinus. No dural tail. No midline shift. No hydrocephalus. No acute large vascular territory infarcts. Patchy supratentorial white matter hypodensities exclusive of the aforementioned abnormality. Limited assessment for hemorrhage on this postcontrast examination. No abnormal extra-axial fluid collections. VASCULAR: Moderate calcific atherosclerosis of the carotid siphons. SKULL: No skull fracture. No destructive bony lesions with particular attention to LEFT frontoparietal convexity.  No significant scalp soft tissue swelling. SINUSES/ORBITS: The mastoid air-cells and included paranasal sinuses are well-aerated.The included ocular globes and orbital contents are non-suspicious. OTHER: None. IMPRESSION: 1. 2.8 x 3.5 x 3.5 cm LEFT frontoparietal mass concerning for metastatic disease given history of lung cancer, less likely primary glial neoplasm. Regional mass effect without midline shift. 2. Moderate chronic small vessel ischemic disease. Electronically Signed   By: Elon Alas M.D.   On: 12/29/2016 17:55        Scheduled Meds: . [MAR Hold] arformoterol  15 mcg Nebulization BID  . [MAR Hold] budesonide (PULMICORT) nebulizer solution  0.5 mg Nebulization BID  . [MAR Hold] calcium-vitamin D  1 tablet Oral QHS  . Chlorhexidine Gluconate Cloth  6 each Topical Once   And  . Chlorhexidine Gluconate Cloth  6 each Topical Once  . [MAR Hold] dexamethasone  4 mg Oral Q6H  . [MAR Hold] diltiazem  240 mg Oral QHS  . [MAR Hold] fluticasone  2 spray Each Nare Daily  . [MAR Hold] ipratropium-albuterol  3 mL Nebulization TID  . [MAR Hold] levETIRAcetam   500 mg Oral BID  . [MAR Hold] multivitamin with minerals  1 tablet Oral Daily  . [MAR Hold] pantoprazole  40 mg Oral BID AC   Continuous Infusions: . sodium chloride       LOS: 4 days    Time spent: 35 minutes.     Hosie Poisson, MD [facility] Pager [phone]  If 7PM-7AM, please contact night-coverage [email or web address] Password [facility] 12/30/2016, 4:44 PM

## 2016-12-30 NOTE — Anesthesia Preprocedure Evaluation (Signed)
Anesthesia Evaluation  Patient identified by MRN, date of birth, ID band Patient awake    Reviewed: Allergy & Precautions, NPO status , Patient's Chart, lab work & pertinent test results  Airway Mallampati: II  TM Distance: >3 FB     Dental   Pulmonary asthma , COPD, former smoker,    breath sounds clear to auscultation       Cardiovascular negative cardio ROS   Rhythm:Regular Rate:Normal     Neuro/Psych    GI/Hepatic GERD  ,  Endo/Other  negative endocrine ROS  Renal/GU Renal disease     Musculoskeletal   Abdominal   Peds  Hematology   Anesthesia Other Findings   Reproductive/Obstetrics                             Anesthesia Physical Anesthesia Plan  ASA: IV  Anesthesia Plan: General   Post-op Pain Management:    Induction: Intravenous  PONV Risk Score and Plan: 3 and Ondansetron, Dexamethasone, Propofol infusion and Treatment may vary due to age or medical condition  Airway Management Planned: Oral ETT  Additional Equipment:   Intra-op Plan:   Post-operative Plan: Possible Post-op intubation/ventilation  Informed Consent:   Plan Discussed with:   Anesthesia Plan Comments:         Anesthesia Quick Evaluation

## 2016-12-30 NOTE — Anesthesia Postprocedure Evaluation (Signed)
Anesthesia Post Note  Patient: NALEYAH OHLINGER  Procedure(s) Performed: Procedure(s) (LRB): CRANIOTOMY TUMOR EXCISION (Left) APPLICATION OF CRANIAL NAVIGATION (Left)     Patient location during evaluation: PACU Anesthesia Type: General Level of consciousness: awake and alert Pain management: pain level controlled Vital Signs Assessment: post-procedure vital signs reviewed and stable Respiratory status: spontaneous breathing, nonlabored ventilation, respiratory function stable and patient connected to nasal cannula oxygen Cardiovascular status: blood pressure returned to baseline and stable Postop Assessment: no apparent nausea or vomiting Anesthetic complications: no    Last Vitals:  Vitals:   12/30/16 1757 12/30/16 1758  BP: (!) 153/56   Pulse: (!) 57   Resp: 11   Temp:  36.4 C  SpO2: 98%     Last Pain:  Vitals:   12/30/16 1730  TempSrc:   PainSc: 0-No pain                 Tiajuana Amass

## 2016-12-30 NOTE — Anesthesia Procedure Notes (Signed)
Arterial Line Insertion Start/End9/27/2018 2:00 AM, 12/30/2016 2:05 AM Performed by: Teressa Lower, CRNA  Patient location: Pre-op. Preanesthetic checklist: patient identified, IV checked, site marked, risks and benefits discussed, surgical consent, monitors and equipment checked, pre-op evaluation, timeout performed and anesthesia consent Lidocaine 1% used for infiltration Right, radial was placed Catheter size: 20 G Hand hygiene performed , maximum sterile barriers used  and Seldinger technique used Allen's test indicative of satisfactory collateral circulation Attempts: 1 Procedure performed using ultrasound guided technique. Ultrasound Notes:anatomy identified, needle tip was noted to be adjacent to the nerve/plexus identified and no ultrasound evidence of intravascular and/or intraneural injection Following insertion, dressing applied. Post procedure assessment: normal and unchanged  Patient tolerated the procedure well with no immediate complications.

## 2016-12-30 NOTE — Transfer of Care (Signed)
Immediate Anesthesia Transfer of Care Note  Patient: Tracy Bridges  Procedure(s) Performed: Procedure(s) with comments: CRANIOTOMY TUMOR EXCISION (Left) - CRANIOTOMY TUMOR EXCISION APPLICATION OF CRANIAL NAVIGATION (Left) - APPLICATION OF CRANIAL NAVIGATION  Patient Location: PACU  Anesthesia Type:General  Level of Consciousness: awake, alert  and oriented  Airway & Oxygen Therapy: Patient Spontanous Breathing and Patient connected to face mask oxygen  Post-op Assessment: Report given to RN and Post -op Vital signs reviewed and stable  Post vital signs: Reviewed and stable  Last Vitals:  Vitals:   12/30/16 0615 12/30/16 1700  BP: (!) 140/46   Pulse: 67   Resp: 20   Temp: 36.7 C 36.8 C  SpO2: 95%     Last Pain:  Vitals:   12/30/16 0737  TempSrc:   PainSc: 0-No pain         Complications: No apparent anesthesia complications

## 2016-12-30 NOTE — Care Management Important Message (Signed)
Important Message  Patient Details  Name: Tracy Bridges MRN: [id number] Date of Birth: 05-25-1940   Medicare Important Message Given:  Yes    Orbie Pyo 12/30/2016, 2:11 PM

## 2016-12-30 NOTE — Anesthesia Procedure Notes (Signed)
Procedure Name: Intubation Date/Time: 12/30/2016 2:29 PM Performed by: Teressa Lower Pre-anesthesia Checklist: Patient identified, Emergency Drugs available, Suction available, Patient being monitored and Timeout performed Patient Re-evaluated:Patient Re-evaluated prior to induction Oxygen Delivery Method: Circle system utilized Preoxygenation: Pre-oxygenation with 100% oxygen Induction Type: IV induction Ventilation: Oral airway inserted - appropriate to patient size and Two handed mask ventilation required Laryngoscope Size: Mac and 3 Grade View: Grade I Tube type: Oral Tube size: 7.0 mm Number of attempts: 1 Airway Equipment and Method: Stylet and Oral airway Placement Confirmation: ETT inserted through vocal cords under direct vision,  positive ETCO2 and breath sounds checked- equal and bilateral Secured at: 23 cm Tube secured with: Tape Dental Injury: Teeth and Oropharynx as per pre-operative assessment  Comments: Intubation performed by Sherie Don, SRNA

## 2016-12-31 ENCOUNTER — Inpatient Hospital Stay (HOSPITAL_COMMUNITY): Payer: Medicare HMO

## 2016-12-31 ENCOUNTER — Encounter (HOSPITAL_COMMUNITY): Payer: Self-pay | Admitting: Neurosurgery

## 2016-12-31 MED ORDER — IOPAMIDOL (ISOVUE-300) INJECTION 61%
INTRAVENOUS | Status: AC
  Administered 2016-12-31: 100 mL
  Filled 2016-12-31: qty 100

## 2016-12-31 MED ORDER — PROCHLORPERAZINE 25 MG RE SUPP
25.0000 mg | Freq: Two times a day (BID) | RECTAL | Status: DC | PRN
  Filled 2016-12-31: qty 1

## 2016-12-31 MED ORDER — IOPAMIDOL (ISOVUE-300) INJECTION 61%
INTRAVENOUS | Status: AC
  Filled 2016-12-31: qty 30

## 2016-12-31 NOTE — Progress Notes (Signed)
Patient ID: Tracy Bridges, female   DOB: 1940-05-05, 77 y.o.   MRN: [id number] BP (!) 139/53   Pulse 61   Temp 97.6 F (36.4 C) (Oral)   Resp 11   Ht 5\' 6"  (1.676 m)   Wt 88.4 kg (194 lb 14.2 oz)   SpO2 100%   BMI 31.46 kg/m  Alert and oriented x4, speech is clear  Moving all extremities Wound is clean, dry, intact MRI shows some residual tumor anteriorly

## 2016-12-31 NOTE — Progress Notes (Signed)
Pt is in ICU under Dr Lacy Duverney service. TRH will sign off.    Tracy Bridges, [id number]

## 2016-12-31 NOTE — Op Note (Signed)
12/26/2016 - 12/30/2016  6:03 PM  PATIENT:  Tracy Bridges  76 y.o. female with a brain mass in the posterior left frontal lobe. She presented with diminished sensation and slight weakness on the right side. CT revealed a lesion. She is taken to the operating room to resect the mass.  PRE-OPERATIVE DIAGNOSIS:  BRAIN TUMOR, left frontal lobe  POST-OPERATIVE DIAGNOSIS:  BRAIN TUMOR, left frontal lobe  PROCEDURE:  Procedure(s): Left frontal CRANIOTOMY TUMOR EXCISION APPLICATION OF CRANIAL NAVIGATION  SURGEON: Surgeon(s): Ashok Pall, MD Consuella Lose, MD  ASSISTANTS:Nundkumar, Nena Polio  ANESTHESIA:   general  EBL:  Total I/O In: 55 [I.V.:560] Out: -   BLOOD ADMINISTERED:none  CELL SAVER GIVEN:none  COUNT:per nursing  DRAINS: none   SPECIMEN:  Source of Specimen:  left frontal lobe  DICTATION: Tracy Bridges was taken to the operating room, intubated, and placed under a general anesthetic without difficulty. Once adequate anesthesia was obtained I placed his head in a three pin [place] head holder. I registered his head to the Brain Lab navigation system and then used the preoperative planning to localize my incision. I shaved, prepped and draped her head in a sterile manner. She was positioned supine with her head up. I opened the skin with a 10 blade and placed raney clips along the scalp edges to control bleeding. Using the navigation, I created burr holes to create a bone flap which exposed the area of the tumor. I opened the dura and reflected the dural flap medially along the craniotomy. I used the navigation because despite the appearance on the Mri that the tumor was on the surface there was no readily apparent tumor. I made a small corticotomy, and immediately encountered abnormal tissue. I sent multiple specimens to the pathologist for frozen sectioning. 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  Dr. Kathyrn Sheriff and I continued our resection, using the  navigation to keep within the tumor confines. We removed most of the tumor, but with the diagnosis we knew we would be recommending radiation to the resection cavity. Given our location in the motor area the risk of complete tumor resection v. Neurological deficit was not deemed to be worth it. We obtained hemostasis in the resection bed. We then started to close the incision. The dura was approximated with sutures loosely, and duragen was placed on the closure. We replaced the skull flap with plates and screws, and approximated the galea with sutures. The scalp was closed with staples. I applied a sterile dressing, and the head holder was removed. She was extubated and taken to the PACU.   PLAN OF CARE: Admit to inpatient   PATIENT DISPOSITION:  PACU - hemodynamically stable.   Delay start of Pharmacological VTE agent (>24hrs) due to surgical blood loss or risk of bleeding:  yes

## 2017-01-01 ENCOUNTER — Inpatient Hospital Stay (HOSPITAL_COMMUNITY): Payer: Medicare HMO

## 2017-01-01 MED ORDER — PROMETHAZINE HCL 25 MG/ML IJ SOLN
12.5000 mg | Freq: Four times a day (QID) | INTRAMUSCULAR | Status: DC | PRN
  Administered 2017-01-01 – 2017-01-07 (×5): 12.5 mg via INTRAVENOUS
  Filled 2017-01-01 (×5): qty 1

## 2017-01-01 MED ORDER — LACTATED RINGERS IV SOLN
INTRAVENOUS | Status: DC
  Administered 2017-01-01: 14:00:00 via INTRAVENOUS
  Administered 2017-01-02: 1000 mL via INTRAVENOUS
  Administered 2017-01-02 – 2017-01-03 (×2): via INTRAVENOUS

## 2017-01-01 NOTE — Progress Notes (Signed)
Vital signs are stable but paitent c/o nausea and vertigo Not responsive to meds Motor function is ok Dressing is dry Will check post op CT  Restart fluids, add iv phenergan

## 2017-01-02 LAB — BASIC METABOLIC PANEL
Anion gap: 6 (ref 5–15)
BUN: 39 mg/dL — ABNORMAL HIGH (ref 6–20)
CO2: 29 mmol/L (ref 22–32)
Calcium: 9.4 mg/dL (ref 8.9–10.3)
Chloride: 102 mmol/L (ref 101–111)
Creatinine, Ser: 0.94 mg/dL (ref 0.44–1.00)
GFR calc Af Amer: >60 mL/min (ref >60)
GFR calc non Af Amer: 57 mL/min — ABNORMAL LOW (ref >60)
Glucose, Bld: 109 mg/dL — ABNORMAL HIGH (ref 65–99)
Potassium: 4.6 mmol/L (ref 3.5–5.1)
Sodium: 137 mmol/L (ref 135–145)

## 2017-01-02 LAB — URINALYSIS, ROUTINE W REFLEX MICROSCOPIC
Bilirubin Urine: NEGATIVE
Glucose, UA: NEGATIVE mg/dL
Hgb urine dipstick: NEGATIVE
Ketones, ur: NEGATIVE mg/dL
Leukocytes, UA: NEGATIVE
Nitrite: NEGATIVE
Protein, ur: NEGATIVE mg/dL
Specific Gravity, Urine: 1.016 (ref 1.005–1.030)
pH: 6 (ref 5.0–8.0)

## 2017-01-02 LAB — CBC
HCT: 34.3 % — ABNORMAL LOW (ref 36.0–46.0)
Hemoglobin: 10.9 g/dL — ABNORMAL LOW (ref 12.0–15.0)
MCH: 29.1 pg (ref 26.0–34.0)
MCHC: 31.8 g/dL (ref 30.0–36.0)
MCV: 91.5 fL (ref 78.0–100.0)
Platelets: 136 10*3/uL — ABNORMAL LOW (ref 150–400)
RBC: 3.75 MIL/uL — ABNORMAL LOW (ref 3.87–5.11)
RDW: 14.5 % (ref 11.5–15.5)
WBC: 12 10*3/uL — ABNORMAL HIGH (ref 4.0–10.5)

## 2017-01-02 MED ORDER — MECLIZINE HCL 25 MG PO TABS
25.0000 mg | ORAL_TABLET | Freq: Three times a day (TID) | ORAL | Status: DC
  Administered 2017-01-02 – 2017-01-07 (×16): 25 mg via ORAL
  Filled 2017-01-02 (×16): qty 1

## 2017-01-02 NOTE — Progress Notes (Signed)
Neurosurgery Progress Note  Continued nausea and vertigo. No other concerns this am  EXAM:  BP (!) 141/65 (BP Location: Right Arm)   Pulse 65   Temp 97.8 F (36.6 C) (Oral)   Resp 18   Ht 5\' 6"  (1.676 m)   Wt 88.4 kg (194 lb 14.2 oz)   SpO2 97%   BMI 31.46 kg/m   Awake, alert, oriented  MAEW. CN grossly intact.  PLAN Nausea and vertigo:     CT head yesterday normal    CBC and BMP not obtained. Reordered.     UA and urine culture also obtained. Discussed with nursing.    On Zofran and Phenerghan. Will add meclizine.

## 2017-01-02 NOTE — Progress Notes (Signed)
Patient sound asleep.

## 2017-01-02 NOTE — Progress Notes (Signed)
Patient has had poor intake. Patient states she experiencing nausea. Patient has been giving Zofran and Phenergan, alternatating both. Will continue to monitor.

## 2017-01-03 LAB — URINE CULTURE

## 2017-01-03 NOTE — Progress Notes (Signed)
BP (!) 139/48 (BP Location: Right Arm)   Pulse (!) 56   Temp 98.2 F (36.8 C) (Oral)   Resp 20   Ht 5\' 6"  (1.676 m)   Wt 88.4 kg (194 lb 14.2 oz)   SpO2 97%   BMI 31.46 kg/m  Alert and following commands Plegic on right side, wound is clean,dry, and without signs of infection.

## 2017-01-03 NOTE — Progress Notes (Signed)
Physical Therapy Evaluation Patient Details Name: Tracy Bridges MRN: [id number] DOB: 10/17/1940 Today's Date: 01/03/2017   History of Present Illness  Pt is a 76 y.o. 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 H/o COPD, A-fib, lung CA, and CKD stage II.   Clinical Impression  Evaluated patient and noted findings below. Prior to admission patient was modified independent with a cane for functional mobility. Patient required increased time and effort for very limited steps. Required repetitive verbal cues to keep eyes open and focus on tasks. She also reported feeling like she was going to be sick, but only spit out saliva. Overall patient demonstrates significant mobility deficits and requires increased time and effort with mobility. Patient has good family support but unsure if they are able to provide adequate caregiver assist at d/c. She is a good candidate for CIR to address those impairments and maximize return to prior level of function. PT will continue to follow acutely and progress as tolerated.    Follow Up Recommendations CIR    Equipment Recommendations   (TBD)    Recommendations for Other Services Rehab consult     Precautions / Restrictions Precautions Precautions: Fall Restrictions Weight Bearing Restrictions: No      Mobility  Bed Mobility               General bed mobility comments: pt OOB in recliner upon arrival  Transfers Overall transfer level: Needs assistance Equipment used: Rolling walker (2 wheeled) Transfers: Sit to/from Stand Sit to Stand: Min assist         General transfer comment: Increased time and effort; VCs for UE placement and to scoot forward and proper LE positioning for stand; repetitive VCs to keep eyes open. Min assist to power up. Poor descent return to sitting and difficulty keeping RUE on chair and RW  Ambulation/Gait Ambulation/Gait assistance: Mod assist Ambulation Distance (Feet): 10 Feet Assistive device:  Rolling walker (2 wheeled) Gait Pattern/deviations: Step-to pattern;Decreased stance time - right;Decreased step length - right;Decreased step length - left;Decreased dorsiflexion - right;Decreased dorsiflexion - left;Decreased weight shift to right;Trunk flexed Gait velocity: decreased Gait velocity interpretation: <1.8 ft/sec, indicative of risk for recurrent falls General Gait Details: increased time and effort to take very few steps; slow with significant lean to left during amb. unable to elicit true muscle test for RLE during session but able to advance RLE however, with decreased DF. Does not sufficiently weight shift to R. PT with wrap around mod assist for stability. VCs for step length and proper maneuver of RW and to keep eyes open and focus on task at hand  Stairs            Wheelchair Mobility    Modified Rankin (Stroke Patients Only)       Balance Overall balance assessment: Needs assistance Sitting-balance support: Feet supported;Single extremity supported Sitting balance-Leahy Scale: Good     Standing balance support: During functional activity;Bilateral upper extremity supported Standing balance-Leahy Scale: Poor                               Pertinent Vitals/Pain Pain Assessment: Faces Faces Pain Scale: Hurts little more Pain Location: headache Pain Descriptors / Indicators: Headache Pain Intervention(s): Premedicated before session    Home Living Family/patient expects to be discharged to:: Private residence Living Arrangements: Spouse/significant other Available Help at Discharge: Family;Available 24 hours/day Type of Home: House Home Access: Stairs to enter Entrance Stairs-Rails:  None Entrance Stairs-Number of Steps: 1 Home Layout: One level Home Equipment: Wheelchair - [employer] - 2 wheels;Cane - single point;Tub bench (items belonged to mother & sister (deceased))      Prior Function Level of Independence: Independent with  assistive device(s)         Comments: Pt stated in June she was mowing lawns     Hand Dominance        Extremity/Trunk Assessment   Upper Extremity Assessment Upper Extremity Assessment: Defer to OT evaluation    Lower Extremity Assessment Lower Extremity Assessment: RLE deficits/detail;LLE deficits/detail;Difficult to assess due to impaired cognition RLE Deficits / Details: DF, PF and knee ext 1/5; noted slight contraction unable to elicit true muscle test but able to advance RLE during amb with decreased DF RLE Sensation: decreased light touch LLE Deficits / Details: general 4-/5; sensation intact to LT       Communication   Communication: No difficulties  Cognition Arousal/Alertness: Lethargic Behavior During Therapy: Flat affect Overall Cognitive Status: Impaired/Different from baseline Area of Impairment: Problem solving;Following commands;Attention;Awareness                   Current Attention Level: Sustained   Following Commands: Follows one step commands inconsistently   Awareness: Intellectual Problem Solving: Slow processing;Requires verbal cues General Comments: pt required repetitive VCs to keep eyes open and for positioning throughout session.       General Comments      Exercises     Assessment/Plan    PT Assessment Patient needs continued PT services  PT Problem List Decreased strength;Decreased range of motion;Decreased activity tolerance;Decreased balance;Decreased mobility;Decreased cognition;Decreased knowledge of use of DME;Decreased safety awareness;Impaired sensation       PT Treatment Interventions DME instruction;Gait training;Functional mobility training;Balance training;Neuromuscular re-education;Patient/family education;Stair training;Therapeutic activities    PT Goals (Current goals can be found in the Care Plan section)  Acute Rehab PT Goals Patient Stated Goal: decrease pain and return home PT Goal Formulation: With  patient Time For Goal Achievement: 01/17/17 Potential to Achieve Goals: Good    Frequency Min 3X/week   Barriers to discharge        Co-evaluation               AM-PAC PT "6 Clicks" Daily Activity  Outcome Measure Difficulty turning over in bed (including adjusting bedclothes, sheets and blankets)?: A Lot Difficulty moving from lying on back to sitting on the side of the bed? : A Lot Difficulty sitting down on and standing up from a chair with arms (e.g., wheelchair, bedside commode, etc,.)?: A Little Help needed moving to and from a bed to chair (including a wheelchair)?: A Lot Help needed walking in hospital room?: A Lot Help needed climbing 3-5 steps with a railing? : Total 6 Click Score: 12    End of Session Equipment Utilized During Treatment: Gait belt;Oxygen Activity Tolerance: Patient limited by fatigue Patient left: in chair;with call bell/phone within reach;with family/visitor present Nurse Communication: Mobility status PT Visit Diagnosis: Muscle weakness (generalized) (M62.81);Difficulty in walking, not elsewhere classified (R26.2)    Time: 3818-2993 PT Time Calculation (min) (ACUTE ONLY): 32 min   Charges:     PT Treatments $Therapeutic Activity: 8-22 mins   PT G CodesSindy Guadeloupe, SPT [phone] office   Margarita Grizzle 01/03/2017, 5:39 PM

## 2017-01-03 NOTE — Care Management Note (Signed)
Case Management Note  Patient Details  Name: Tracy Bridges MRN: [id number] Date of Birth: 31-May-1940  Subjective/Objective:                    Action/Plan: Currently the plan is home with her spouse. May need PT/OT eval. MD please order if needed. CM following.  Expected Discharge Date:                  Expected Discharge Plan:  Home/Self Care  In-House Referral:     Discharge planning Services  CM Consult  Post Acute Care Choice:    Choice offered to:     DME Arranged:    DME Agency:     HH Arranged:    HH Agency:     Status of Service:  In process, will continue to follow  If discussed at Long Length of Stay Meetings, dates discussed:    Additional Comments:  Pollie Friar, RN 01/03/2017, 11:56 AM

## 2017-01-04 DIAGNOSIS — G939 Disorder of brain, unspecified: Secondary | ICD-10-CM

## 2017-01-04 DIAGNOSIS — J438 Other emphysema: Secondary | ICD-10-CM

## 2017-01-04 DIAGNOSIS — G8191 Hemiplegia, unspecified affecting right dominant side: Secondary | ICD-10-CM

## 2017-01-04 DIAGNOSIS — I480 Paroxysmal atrial fibrillation: Secondary | ICD-10-CM

## 2017-01-04 DIAGNOSIS — D496 Neoplasm of unspecified behavior of brain: Secondary | ICD-10-CM

## 2017-01-04 NOTE — Evaluation (Signed)
Occupational Therapy Evaluation Patient Details Name: Tracy Bridges MRN: [id number] DOB: Feb 18, 1941 Today's Date: 01/04/2017    History of Present Illness Pt is a 76 y.o. 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 H/o COPD, A-fib, lung CA, and CKD stage II.    Clinical Impression   PTA, pt was living with her husband and was independent. Pt currently requiring Mod A for UB ADLs and Max A for LB ADLs. Pt requiring Mod A to use RUE during grooming task at sink. Pt presenting with decreased cognition, balance, and functional use of RUE. Pt would benefit form further acute OT to facilitate safe dc. Recommend dc to CIR to intense OT to optimize safety and independence with ADLs and functional mobility as well as decrease caregiver burden.     Follow Up Recommendations  CIR    Equipment Recommendations  Other (comment) (Defer to next venue)    Recommendations for Other Services Rehab consult;PT consult     Precautions / Restrictions Precautions Precautions: Fall Restrictions Weight Bearing Restrictions: No      Mobility Bed Mobility               General bed mobility comments: pt OOB in recliner upon arrival  Transfers Overall transfer level: Needs assistance Equipment used: Rolling walker (2 wheeled) Transfers: Sit to/from Stand Sit to Stand: Min assist;+2 safety/equipment         General transfer comment: increased time and effort to complete; VCs for UE placement and to scoot forward with good LE positioning for stand. Repetitive VCs to keep eyes open. Better descent to chair today.     Balance Overall balance assessment: Needs assistance Sitting-balance support: Feet supported;Single extremity supported Sitting balance-Leahy Scale: Good     Standing balance support: During functional activity;Bilateral upper extremity supported Standing balance-Leahy Scale: Poor Standing balance comment: unable to complete tasks at sink without at least one UE  supported                           ADL either performed or assessed with clinical judgement   ADL Overall ADL's : Needs assistance/impaired Eating/Feeding: Minimal assistance;Sitting Eating/Feeding Details (indicate cue type and reason): Pt able to self feed with L hand with min A to open lids and containers Grooming: Oral care;Moderate assistance;Standing Grooming Details (indicate cue type and reason): Mod A +2 to maintain balance at sink. Mod A to use R hand while performing oral care. Mod A to support RUE at elbow and wrist; assist to bring hand back and forth when brushing teeth instead of moving head.  Upper Body Bathing: Moderate assistance;Sitting   Lower Body Bathing: Maximal assistance;Sit to/from stand   Upper Body Dressing : Moderate assistance;Sitting   Lower Body Dressing: Maximal assistance;Sit to/from stand               Functional mobility during ADLs: Minimal assistance;+2 for safety/equipment;Rolling walker;Cueing for sequencing;Cueing for safety General ADL Comments: Pt demonstrating decreased functional performance. RUE with decreased grasp strength, coorination, ROM, and fucntional use. Pt with decreased standing balance     Vision Patient Visual Report: Other (comment) (Pt reporting significant light sensativity) Vision Assessment?: Vision impaired- to be further tested in functional context     Perception     Praxis      Pertinent Vitals/Pain Pain Assessment: Faces Faces Pain Scale: Hurts little more Pain Location: headache Pain Descriptors / Indicators: Headache Pain Intervention(s): Monitored during session;Repositioned  Hand Dominance Right   Extremity/Trunk Assessment Upper Extremity Assessment Upper Extremity Assessment: RUE deficits/detail RUE Deficits / Details: Poor grasp strength and opposition (increased time to complete opposition). Able to bring hand to mouth with Min A while sitting and support at elbow and wrist.   Pt bringing toothrbush to mouth with Mod A to support elbow and wrist; requring support to bring toothbrush to back of mouth.  RUE Sensation: decreased light touch RUE Coordination: decreased fine motor;decreased gross motor   Lower Extremity Assessment Lower Extremity Assessment: Defer to PT evaluation       Communication Communication Communication: No difficulties;Other (comment) (Slower processing and requires increased time)   Cognition Arousal/Alertness: Lethargic (Difficulty keeping her eyes open) Behavior During Therapy: Flat affect Overall Cognitive Status: Impaired/Different from baseline Area of Impairment: Problem solving;Following commands;Attention;Awareness;Safety/judgement                   Current Attention Level: Sustained   Following Commands: Follows one step commands consistently;Follows one step commands with increased time Safety/Judgement: Decreased awareness of safety Awareness: Intellectual Problem Solving: Slow processing;Requires verbal cues;Requires tactile cues General Comments: Pt requiring tactile and verbal cues for grooming at sink and safe transfer technqiues.   General Comments  Pt neice and sister present throughout session. Very supportive and motivated to increase pt's early mobilization. Educated family on UE excercises. Prvided squeeze ball and built up handle    Exercises Exercises: General Lower Extremity General Exercises - Lower Extremity Hip Flexion/Marching: AROM;Right;5 reps;Seated (Inrecliner)   Shoulder Instructions      Home Living Family/patient expects to be discharged to:: Private residence Living Arrangements: Spouse/significant other Available Help at Discharge: Family;Available 24 hours/day Type of Home: House Home Access: Stairs to enter [employer] of Steps: 1 Entrance Stairs-Rails: None Home Layout: One level     Bathroom Shower/Tub: Teacher, early years/pre: Handicapped  height Bathroom Accessibility: Yes   Home Equipment: Wheelchair - [employer] - 2 wheels;Cane - single point;Tub bench (items belonged to mother & sister (deceased))          Prior Functioning/Environment Level of Independence: Independent with assistive device(s)        Comments: Per pt's family, she was independent with ADLs and IADLs several months ago, and has progressively gotten worse. Pt stated in June she was mowing lawns        OT Problem List: Decreased range of motion;Decreased strength;Decreased activity tolerance;Impaired balance (sitting and/or standing);Decreased cognition;Decreased safety awareness;Decreased knowledge of use of DME or AE;Impaired UE functional use;Pain      OT Treatment/Interventions: Self-care/ADL training;Therapeutic exercise;Energy conservation;DME and/or AE instruction;Therapeutic activities;Patient/family education    OT Goals(Current goals can be found in the care plan section) Acute Rehab OT Goals Patient Stated Goal: decrease pain and return home OT Goal Formulation: With patient Time For Goal Achievement: 01/18/17 Potential to Achieve Goals: Good ADL Goals Pt Will Perform Grooming: with min assist;standing (using RUE 50% of time) Pt Will Perform Upper Body Dressing: with supervision;with set-up;sitting Pt/caregiver will Perform Home Exercise Program: Right Upper extremity;Increased strength;With theraputty;With Supervision;With written HEP provided Additional ADL Goal #1: Pt will perform three step ADL with Min VCs   OT Frequency: Min 3X/week   Barriers to D/C:            Co-evaluation PT/OT/SLP Co-Evaluation/Treatment: Yes Reason for Co-Treatment: Complexity of the patient's impairments (multi-system involvement) PT goals addressed during session: Mobility/safety with mobility OT goals addressed during session: ADL's and self-care  AM-PAC PT "6 Clicks" Daily Activity     Outcome Measure Help from another person eating  meals?: A Little Help from another person taking care of personal grooming?: A Little Help from another person toileting, which includes using toliet, bedpan, or urinal?: A Lot Help from another person bathing (including washing, rinsing, drying)?: A Lot Help from another person to put on and taking off regular upper body clothing?: A Lot Help from another person to put on and taking off regular lower body clothing?: A Lot 6 Click Score: 14   End of Session Equipment Utilized During Treatment: Gait belt;Rolling walker;Oxygen Nurse Communication: Mobility status  Activity Tolerance: Patient tolerated treatment well;Patient limited by fatigue;Patient limited by lethargy Patient left: in chair;with call bell/phone within reach;with family/visitor present  OT Visit Diagnosis: Unsteadiness on feet (R26.81);Other abnormalities of gait and mobility (R26.89);Muscle weakness (generalized) (M62.81);Pain;Other symptoms and signs involving cognitive function;Hemiplegia and hemiparesis Hemiplegia - Right/Left: Right Hemiplegia - dominant/non-dominant: Dominant 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

## 2017-01-04 NOTE — Progress Notes (Signed)
Rehab Admissions Coordinator Note:  Patient was screened by Cleatrice Burke for appropriateness for an Inpatient Acute Rehab Consult per PT recommendation.   At this time, we are recommending Inpatient Rehab consult. Please place order for consult if you would like pt considered for admission. Please advise.  Cleatrice Burke 01/04/2017, 12:44 PM  I can be reached at [phone].

## 2017-01-04 NOTE — Consult Note (Signed)
Physical Medicine and Rehabilitation Consult  Reason for Consult:  Deficits in mobility and ADLs.  Referring Physician:  Dr. Christella Noa.    HPI: Tracy Bridges is a 76 y.o. female with history of A fib, emphysema, bronchiectasis?--uses percussion vest and nebs tid,  lung cancer who was admitted on 12/26/16 with abnormal RUE motor activity. Patient with 6 week history of progressive right sided weakness with foot drop, headaches and nausea but unable to tolerate MRI for work up. Head CT done revealing She was as well as large ovoid mass in high left frontal lobe with vasogenic edema. 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  She was started on Keppra as well as steroids. Eliquis placed on hold and patient transitioned to IV heparin. CT abdomen/pelvis done showing no evidence of metastatic disease and incidental findings of  left thyroid nodule and moderate size HH. She underwent craniotomy for tumor resection on 12/31/16 by Dr. Christella Noa. Pathology revealed metastatic lung cancer and heme/onc input pending.  Patient with resultant right hemiplegia, lethargy, as well as deficits in processing and following commands. CIR recommended due to deficits in mobility and ability to carry out ADL tasks.     Husband supportive and has been providing physical assistance since June when she started having weakness with foot drop. He has been helping with home management.    Review of Systems  Constitutional: Positive for malaise/fatigue.  HENT: Negative for hearing loss and tinnitus.   Eyes: Positive for double vision and photophobia.  Respiratory: Positive for shortness of breath (with activity). Negative for cough.   Cardiovascular: Positive for palpitations. Negative for chest pain and leg swelling.  Gastrointestinal: Negative for constipation, heartburn and nausea.  Genitourinary: Negative  for dysuria and urgency.  Musculoskeletal: Positive for myalgias.  Skin: Negative for itching and rash.  Neurological: Positive for sensory change, focal weakness, weakness and headaches (constant).  Psychiatric/Behavioral: Negative for memory loss. The patient is nervous/anxious.       Past Medical History:  Diagnosis Date  . Asthma   . Atrial fibrillation ([place])   . Emphysema/COPD ([place])   . Lung cancer [facility])     Past Surgical History:  Procedure Laterality Date  . ABDOMINAL HYSTERECTOMY    . APPLICATION OF CRANIAL NAVIGATION Left 12/30/2016   Procedure: APPLICATION OF CRANIAL NAVIGATION;  Surgeon: Ashok Pall, MD;  Location: [place];  Service: Neurosurgery;  Laterality: Left;  APPLICATION OF CRANIAL NAVIGATION  . CESAREAN SECTION    . CRANIOTOMY Left 12/30/2016   Procedure: CRANIOTOMY TUMOR EXCISION;  Surgeon: Ashok Pall, MD;  Location: [place];  Service: Neurosurgery;  Laterality: Left;  CRANIOTOMY TUMOR EXCISION  . HERNIA REPAIR    . LUNG REMOVAL, PARTIAL  04/1997    Family History  Problem Relation Age of Onset  . Liver cancer Other   . Heart disease Other   . Brain cancer Neg Hx     Social History:  Married. Has needed assistance since June with ADLs and has been furniture walking due to RLE weakness and instability. Has supportive family.  She  reports that she quit smoking about 38 years ago. Her smoking use included Cigarettes. She has never used smokeless tobacco. She reports that she does not drink alcohol or use drugs.   Allergies  Allergen Reactions  . Aspirin Shortness Of Breath  . Atenolol Swelling    Throat swelled  and closed up  . Boniva [Ibandronic Acid] Other (See Comments)    Pt does not remember specific reaction  . Fluticasone Anaphylaxis and Hives  . Fosamax [Alendronate Sodium] Other (See Comments)    Pt does not remember specific reaction  . Latex Swelling and Rash    Throat swelled and closed up  . Mucinex [Guaifenesin Er] Anaphylaxis  .  Rocephin [Ceftriaxone Sodium In Dextrose] Anaphylaxis and Rash  . Rotateq [Rotavirus Vaccine Live Oral] Other (See Comments)    "poorly tolerated"  . Sulfa Antibiotics Other (See Comments)    Pt does not remember specific reaction  . Vancomycin Other (See Comments)    Pt does not remember specific reaction  . Penicillins Rash    Childhood reaction Has patient had a PCN reaction causing immediate rash, facial/tongue/throat swelling, SOB or lightheadedness with hypotension: Yes Has patient had a PCN reaction causing severe rash involving mucus membranes or skin necrosis: No Has patient had a PCN reaction that required hospitalization: Unknown Has patient had a PCN reaction occurring within the last 10 years: No If all of the above answers are "NO", then may proceed with Cephalosporin use.  . Tobramycin Sulfate Rash    Medications Prior to Admission  Medication Sig Dispense Refill  . acetaminophen (TYLENOL) 500 MG tablet Take 1,000 mg by mouth every 6 (six) hours as needed for headache (pain).    Marland Kitchen albuterol (PROAIR HFA) 108 (90 Base) MCG/ACT inhaler Inhale 1-2 puffs into the lungs every 4 (four) hours as needed for wheezing or shortness of breath.    . Calcium Carbonate-Vitamin D (CALCIUM-D PO) Take 1 tablet by mouth at bedtime.    . ciprofloxacin (CIPRO) 500 MG tablet Take 500 mg by mouth See admin instructions. Take 1 tablet (500 mg) by mouth twice daily for 10 days as needed for infection    . clopidogrel (PLAVIX) 75 MG tablet Take 75 mg by mouth at bedtime.     Marland Kitchen diltiazem (CARDIZEM LA) 240 MG 24 hr tablet Take 1 tablet (240 mg total) by mouth daily. (Patient taking differently: Take 240 mg by mouth at bedtime. ) 30 tablet 0  . EPINEPHrine (EPIPEN) 0.3 mg/0.3 mL SOAJ injection Inject 0.3 mg into the muscle once as needed (severe allergic reaction).     . furosemide (LASIX) 20 MG tablet Take 20 mg by mouth daily as needed for fluid or edema.    Marland Kitchen HYDROcodone-homatropine (HYCODAN) 5-1.5  MG/5ML syrup Take 5 mLs by mouth at bedtime as needed for cough.    Marland Kitchen ipratropium-albuterol (DUONEB) 0.5-2.5 (3) MG/3ML SOLN Take 3 mLs by nebulization 3 (three) times daily.     . Multiple Vitamin (MULTIVITAMIN WITH MINERALS) TABS tablet Take 1 tablet by mouth daily.    Marland Kitchen omeprazole (PRILOSEC) 20 MG capsule Take 20 mg by mouth 2 (two) times daily.    . OXYGEN Inhale 2 L into the lungs at bedtime.    . predniSONE (DELTASONE) 10 MG tablet Take 10 mg by mouth every other day.    Marland Kitchen PRESCRIPTION MEDICATION Inject into the muscle See admin instructions. 2 allergy shots weekly by Dr. Fransico Michael, [place], [place]    . salmeterol (SEREVENT DISKUS) 50 MCG/DOSE diskus inhaler Inhale 1 puff into the lungs 2 (two) times daily as needed (shortness of breath).     . Vitamin D, Ergocalciferol, (DRISDOL) 50000 UNITS CAPS capsule Take 50,000 Units by mouth See admin instructions. Take 1 capsule (50,000 units) by mouth every other Saturday    . [  DISCONTINUED] Triamcinolone Acetonide (AZMACORT IN) Inhale 2 puffs into the lungs 4 (four) times daily as needed (shortness of breath).      Home: Home Living Family/patient expects to be discharged to:: Private residence Living Arrangements: Spouse/significant other Available Help at Discharge: Family, Available 24 hours/day Type of Home: House Home Access: Stairs to enter [employer] of Steps: 1 Entrance Stairs-Rails: None Home Layout: One level Bathroom Shower/Tub: Chiropodist: Handicapped height Bathroom Accessibility: Yes Home Equipment: Wheelchair - manual, Environmental consultant - 2 wheels, [place] - single point, Tub bench (items belonged to mother & sister (deceased))  Functional History: Prior Function Level of Independence: Independent with assistive device(s) Comments: Per pt's family, she was independent with ADLs and IADLs several months ago, and has progressively gotten worse. Pt stated in June she was mowing lawns Functional Status:    Mobility: Bed Mobility General bed mobility comments: pt OOB in recliner upon arrival Transfers Overall transfer level: Needs assistance Equipment used: Rolling walker (2 wheeled) Transfers: Sit to/from Stand Sit to Stand: Min assist, +2 safety/equipment General transfer comment: increased time and effort to complete; VCs for UE placement and to scoot forward with good LE positioning for stand. Repetitive VCs to keep eyes open. Better descent to chair today.  Ambulation/Gait Ambulation/Gait assistance: Mod assist, +2 physical assistance Ambulation Distance (Feet): 10 Feet Assistive device: Rolling walker (2 wheeled) Gait Pattern/deviations: Step-to pattern, Decreased stance time - right, Decreased step length - right, Decreased step length - left, Decreased dorsiflexion - right, Decreased dorsiflexion - left, Decreased weight shift to right, Trunk flexed General Gait Details: slow; increased time and effort to take very few steps again today. required repetitive VCs to keep eyes open and to achieve appropriate step length. +2 for pushing chair behind during amb Gait velocity: decreased Gait velocity interpretation: <1.8 ft/sec, indicative of risk for recurrent falls    ADL: ADL Overall ADL's : Needs assistance/impaired Eating/Feeding: Minimal assistance, Sitting Eating/Feeding Details (indicate cue type and reason): Pt able to self feed with L hand with min A to open lids and containers Grooming: Oral care, Moderate assistance, Standing Grooming Details (indicate cue type and reason): Mod A +2 to maintain balance at sink. Mod A to use R hand while performing oral care. Mod A to support RUE at elbow and wrist; assist to bring hand back and forth when brushing teeth instead of moving head.  Upper Body Bathing: Moderate assistance, Sitting Lower Body Bathing: Maximal assistance, Sit to/from stand Upper Body Dressing : Moderate assistance, Sitting Lower Body Dressing: Maximal assistance,  Sit to/from stand Functional mobility during ADLs: Minimal assistance, +2 for safety/equipment, Rolling walker, Cueing for sequencing, Cueing for safety General ADL Comments: Pt demonstrating decreased functional performance. RUE with decreased grasp strength, coorination, ROM, and fucntional use. Pt with decreased standing balance  Cognition: Cognition Overall Cognitive Status: Impaired/Different from baseline Orientation Level: Oriented X4 Cognition Arousal/Alertness: Lethargic (Difficulty keeping her eyes open) Behavior During Therapy: Flat affect Overall Cognitive Status: Impaired/Different from baseline Area of Impairment: Problem solving, Following commands, Attention, Awareness, Safety/judgement Current Attention Level: Sustained Following Commands: Follows one step commands consistently, Follows one step commands with increased time Safety/Judgement: Decreased awareness of safety Awareness: Intellectual Problem Solving: Slow processing, Requires verbal cues, Requires tactile cues General Comments: Pt requiring tactile and verbal cues for grooming at sink and safe transfer technqiues.  Blood pressure (!) 139/42, pulse (!) 57, temperature 98.2 F (36.8 C), temperature source Oral, resp. rate 16, height 5\' 6"  (1.676 m), weight 88.4  kg (194 lb 14.2 oz), SpO2 98 %. Physical Exam  Nursing note and vitals reviewed. Constitutional: She is oriented to person, place, and time. She appears well-developed and well-nourished.  HENT:  Head: Normocephalic and atraumatic.  Eyes: Pupils are equal, round, and reactive to light. Conjunctivae are normal.  Neck: Normal range of motion. Neck supple.  Cardiovascular: Normal rate.  An irregular rhythm present.  Respiratory: Effort normal. No stridor. No respiratory distress. She has decreased breath sounds. She has no wheezes. She exhibits no tenderness.  GI: Soft. Bowel sounds are normal. She exhibits no distension. There is no tenderness.    Musculoskeletal: She exhibits no edema or tenderness.  Neurological: She is alert and oriented to person, place, and time.  Kept eyes closed due to HA and light sensitivity. Speech clear. Able to answer orientation questions without difficulty and follow simple motor commands. Nystagmus in lateral fields with dizziness. Right hemiparesis with decreased sensation to light touch.   Skin: Skin is warm and dry.  Motor strength is 5/5 in the left deltoid, bicep, tricep, grip, hip flexor, knee extensor, ankle dorsiflexor. Trace right elbow flexion, 0 at the deltoid, 0 at the anger flexors and extensors, 2 minus hip, knee extensor synergy to minus at the ankle plantar flexor, dorsiflexor  No results found for this or any previous visit (from the past 24 hour(s)). No results found.  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 Does the need for close, 24 hr/day medical supervision in concert with the patient's rehab needs make it unreasonable for this patient to be served in a less intensive setting? Yes 2. Co-Morbidities requiring supervision/potential complications: Atrial fibrillation, emphysema 3. Due to bladder management, bowel management, safety, skin/wound care, disease management, medication administration, pain management and patient education, does the patient require 24 hr/day rehab nursing? Yes 4. Does the patient require coordinated care of a physician, rehab nurse, PT (1-2 hrs/day, 5 days/week), OT (1-2 hrs/day, 5 days/week) and SLP (.5-1 hrs/day, 5 days/week) to address physical and functional deficits in the context of the above medical diagnosis(es)? Yes Addressing deficits in the following areas: balance, endurance, locomotion, strength, transferring, bowel/bladder control, bathing, dressing, feeding, grooming, toileting and psychosocial support 5. Can the patient actively participate in an intensive therapy program of at least 3 hrs  of therapy per day at least 5 days per week? Yes 6. The potential for patient to make measurable gains while on inpatient rehab is good 7. Anticipated functional outcomes upon discharge from inpatient rehab are supervision  with PT, supervision with OT, supervision with SLP. 8. Estimated rehab length of stay to reach the above functional goals is: 14-16d 9. Anticipated D/C setting: Home 10. Anticipated post D/C treatments: [place] therapy 11. Overall Rehab/Functional Prognosis: fair  RECOMMENDATIONS: This patient's condition is appropriate for continued rehabilitative care in the following setting: CIR Patient has agreed to participate in recommended program. Yes Note that insurance prior authorization may be required for reimbursement for recommended care.  Comment: Await oncology consultation to establish treatment plan as this may impact her rehabilitation   Charlett Blake M.D. [facility] FAAPM&R (Sports Med, Neuromuscular Med) Diplomate Am Board of Electrodiagnostic Med  Flora Lipps 01/04/2017

## 2017-01-04 NOTE — Progress Notes (Signed)
Physical Therapy Treatment Patient Details Name: Tracy Bridges MRN: [id number] DOB: 09-Jul-1940 Today's Date: 01/04/2017    History of Present Illness Pt is a 76 y.o. 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 H/o COPD, A-fib, lung CA, and CKD stage II.     PT Comments    Patient still requires increased time and effort take very few steps as well as repetitive verbal cues throughout treatment to keep eyes open and focus on tasks being performed. This patient continues to demonstrate significant need for intensive therapies to address mobility deficits for and to maximize function. CIR remains an appropriate recommendation for this patient. PT will continue to see acutely and progress as tolerated.     Follow Up Recommendations  CIR     Equipment Recommendations   (TBD)    Recommendations for Other Services Rehab consult     Precautions / Restrictions Precautions Precautions: Fall Restrictions Weight Bearing Restrictions: No    Mobility  Bed Mobility               General bed mobility comments: pt OOB in recliner upon arrival  Transfers Overall transfer level: Needs assistance Equipment used: Rolling walker (2 wheeled) Transfers: Sit to/from Stand Sit to Stand: Min assist;+2 safety/equipment         General transfer comment: increased time and effort to complete; VCs for UE placement and to scoot forward with good LE positioning for stand. Repetitive VCs to keep eyes open. Better descent to chair today.   Ambulation/Gait Ambulation/Gait assistance: Mod assist;+2 physical assistance Ambulation Distance (Feet): 10 Feet Assistive device: Rolling walker (2 wheeled) Gait Pattern/deviations: Step-to pattern;Decreased stance time - right;Decreased step length - right;Decreased step length - left;Decreased dorsiflexion - right;Decreased dorsiflexion - left;Decreased weight shift to right;Trunk flexed Gait velocity: decreased Gait velocity interpretation:  <1.8 ft/sec, indicative of risk for recurrent falls General Gait Details: slow; increased time and effort to take very few steps again today. required repetitive VCs to keep eyes open and to achieve appropriate step length. +2 for pushing chair behind during amb   Stairs            Wheelchair Mobility    Modified Rankin (Stroke Patients Only)       Balance Overall balance assessment: Needs assistance Sitting-balance support: Feet supported;Single extremity supported Sitting balance-Leahy Scale: Good     Standing balance support: During functional activity;Bilateral upper extremity supported Standing balance-Leahy Scale: Poor Standing balance comment: unable to complete tasks at sink without at least one UE supported                            Cognition Arousal/Alertness: Lethargic Behavior During Therapy: Flat affect Overall Cognitive Status: Impaired/Different from baseline Area of Impairment: Problem solving;Following commands;Attention;Awareness                   Current Attention Level: Sustained   Following Commands: Follows one step commands consistently   Awareness: Intellectual Problem Solving: Slow processing;Requires verbal cues General Comments: pt required repetitive VCs to keep eyes open and for positioning throughout session.       Exercises      General Comments General comments (skin integrity, edema, etc.): patient's family present during session and educated on AROM/strengthening exercises for UEs and LEs that the patient can do in seated position. One instance of patient reporting feeling nauseous but only spit out saliva.      Pertinent Vitals/Pain Pain Assessment:  Faces Faces Pain Scale: Hurts little more Pain Location: headache Pain Descriptors / Indicators: Headache Pain Intervention(s): Monitored during session    Home Living                      Prior Function            PT Goals (current goals can  now be found in the care plan section) Acute Rehab PT Goals Patient Stated Goal: decrease pain and return home PT Goal Formulation: With patient Time For Goal Achievement: 01/17/17 Potential to Achieve Goals: Good Progress towards PT goals: Progressing toward goals    Frequency    Min 3X/week      PT Plan Current plan remains appropriate    Co-evaluation PT/OT/SLP Co-Evaluation/Treatment: Yes Reason for Co-Treatment: To address functional/ADL transfers PT goals addressed during session: Mobility/safety with mobility;Balance        AM-PAC PT "6 Clicks" Daily Activity  Outcome Measure  Difficulty turning over in bed (including adjusting bedclothes, sheets and blankets)?: A Lot Difficulty moving from lying on back to sitting on the side of the bed? : A Lot Difficulty sitting down on and standing up from a chair with arms (e.g., wheelchair, bedside commode, etc,.)?: A Lot Help needed moving to and from a bed to chair (including a wheelchair)?: A Lot Help needed walking in hospital room?: A Lot Help needed climbing 3-5 steps with a railing? : Total 6 Click Score: 11    End of Session Equipment Utilized During Treatment: Gait belt;Oxygen Activity Tolerance: Patient limited by fatigue Patient left: in chair;with call bell/phone within reach;with family/visitor present Nurse Communication: Mobility status PT Visit Diagnosis: Muscle weakness (generalized) (M62.81);Difficulty in walking, not elsewhere classified (R26.2)     Time: 2575-0518 PT Time Calculation (min) (ACUTE ONLY): 22 min  Charges:  $Therapeutic Activity: 8-22 mins                    G CodesSindy Guadeloupe, SPT [phone] office    Margarita Grizzle 01/04/2017, 12:11 PM

## 2017-01-04 NOTE — Progress Notes (Signed)
Patient ID: Tracy Bridges, female   DOB: 04/06/1940, 76 y.o.   MRN: [id number] BP (!) 118/42 (BP Location: Right Arm)   Pulse 61   Temp 98.2 F (36.8 C) (Axillary)   Resp 16   Ht 5\' 6"  (1.676 m)   Wt 88.4 kg (194 lb 14.2 oz)   SpO2 97%   BMI 31.46 kg/m  Alert and oriented x 4, plegic on right side Wound is clean,dry, and without signs of infection Pathology favors a lung primary.  Consulted oncology

## 2017-01-05 NOTE — Progress Notes (Signed)
Physical Therapy Treatment Patient Details Name: Tracy Bridges MRN: [id number] DOB: 12/25/1940 Today's Date: 01/05/2017    History of Present Illness Pt is a 76 y.o. 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 H/o COPD, A-fib, lung CA, and CKD stage II.     PT Comments    Pt seen for mobility progression and continues to be limited secondary to fatigue, lethargy and weakness of R LE. Pt tolerated transfers this session with RW and min-mod A x2. PT will continue to follow acutely for mobility progression. Pt remains an excellent candidate for CIR to maximize her independence with functional mobility.   Follow Up Recommendations  CIR     Equipment Recommendations  None recommended by PT    Recommendations for Other Services       Precautions / Restrictions Precautions Precautions: Fall Restrictions Weight Bearing Restrictions: No    Mobility  Bed Mobility Overal bed mobility: Needs Assistance Bed Mobility: Sit to Supine       Sit to supine: Mod assist;+2 for physical assistance   General bed mobility comments: increased time and effort, assist with trunk guidance and bilateral LEs to return to supine  Transfers Overall transfer level: Needs assistance Equipment used: Rolling walker (2 wheeled) Transfers: Sit to/from [facility] Sit to Stand: Min assist Stand pivot transfers: Mod assist;+2 physical assistance;+2 safety/equipment       General transfer comment: increased time and effort, cueing for hand placement, min A to rise from chair x1 and from [facility] x1, mod A x2 for safety with pivotal movement to [facility]  Ambulation/Gait             General Gait Details: attempted ambulation this session, however, pt very lethargic and difficulty advancing L LE to move forwards   Stairs            Wheelchair Mobility    Modified Rankin (Stroke Patients Only)       Balance Overall balance assessment: Needs  assistance Sitting-balance support: Feet supported Sitting balance-Leahy Scale: Fair     Standing balance support: During functional activity;Bilateral upper extremity supported Standing balance-Leahy Scale: Poor Standing balance comment: reliable on bilateral UEs on RW                            Cognition Arousal/Alertness: Lethargic Behavior During Therapy: Flat affect Overall Cognitive Status: Impaired/Different from baseline Area of Impairment: Problem solving;Following commands;Attention;Awareness;Safety/judgement                   Current Attention Level: Sustained   Following Commands: Follows one step commands consistently;Follows one step commands with increased time Safety/Judgement: Decreased awareness of safety Awareness: Intellectual Problem Solving: Slow processing;Requires verbal cues;Requires tactile cues        Exercises      General Comments        Pertinent Vitals/Pain Pain Assessment: Faces Faces Pain Scale: No hurt    Home Living                      Prior Function            PT Goals (current goals can now be found in the care plan section) Acute Rehab PT Goals PT Goal Formulation: With patient Time For Goal Achievement: 01/17/17 Potential to Achieve Goals: Good Progress towards PT goals: Progressing toward goals    Frequency    Min 3X/week      PT  Plan Current plan remains appropriate    Co-evaluation              AM-PAC PT "6 Clicks" Daily Activity  Outcome Measure  Difficulty turning over in bed (including adjusting bedclothes, sheets and blankets)?: A Lot Difficulty moving from lying on back to sitting on the side of the bed? : A Lot Difficulty sitting down on and standing up from a chair with arms (e.g., wheelchair, bedside commode, etc,.)?: Unable Help needed moving to and from a bed to chair (including a wheelchair)?: A Lot Help needed walking in hospital room?: A Lot Help needed  climbing 3-5 steps with a railing? : Total 6 Click Score: 10    End of Session Equipment Utilized During Treatment: Gait belt Activity Tolerance: Patient limited by fatigue;Patient limited by lethargy Patient left: in bed;with call bell/phone within reach;with bed alarm set;with family/visitor present;with SCD's reapplied Nurse Communication: Mobility status PT Visit Diagnosis: Muscle weakness (generalized) (M62.81);Difficulty in walking, not elsewhere classified (R26.2)     Time: 3557-3220 PT Time Calculation (min) (ACUTE ONLY): 24 min  Charges:  $Therapeutic Activity: 23-37 mins                    G Codes:       [place], [place], [place] 01/05/2017, 5:07 PM

## 2017-01-05 NOTE — Progress Notes (Signed)
Patient ID: Tracy Bridges, female   DOB: 1940/10/30, 76 y.o.   MRN: [id number] BP 127/60 (BP Location: Right Arm)   Pulse 60   Temp 98.3 F (36.8 C) (Oral)   Resp 16   Ht 5\' 6"  (1.676 m)   Wt 85 kg (187 lb 6.4 oz)   SpO2 98%   BMI 30.25 kg/m  Alert and oriented x 4, speech is clear and fluent Moving left side well Weak on right  Wound is clean,dry, no signs of infection Will have rad onc consult tomorrow. No need for chemotherapy as no primary can be identified.

## 2017-01-05 NOTE — Progress Notes (Signed)
Her chart was reviewed today including reviewing her pathology as well as CT scans. These findings discussed with the patient and her husband today. Per his report, she had a tumor on her lung resected 20 years ago and [place] with the details of that are not available at this time. The CT scans on 12/31/2016 do not show clear-cut primary. The final pathology indicated potentially a lung cancer primary.  No medical oncology intervention is needed at this time. She is under consideration for acute rehabilitation and she will require outpatient follow-up upon completing that.  She will have a follow-up arranged with Dr. Sherrilee Gilles from a neuro-oncology standpoint as well as Dr. Julien Nordmann from a medical oncology standpoint.  I have also updated her niece(Kathy Hazelowood) over the phone today.

## 2017-01-06 ENCOUNTER — Encounter: Payer: Self-pay | Admitting: Internal Medicine

## 2017-01-06 ENCOUNTER — Encounter: Payer: Self-pay | Admitting: *Deleted

## 2017-01-06 MED ORDER — PROMETHAZINE HCL 25 MG RE SUPP
25.0000 mg | Freq: Four times a day (QID) | RECTAL | Status: DC | PRN
Start: 2017-01-06 — End: 2017-01-07

## 2017-01-06 NOTE — Progress Notes (Signed)
Oncology Nurse Navigator Documentation  Oncology Nurse Navigator Flowsheets 01/06/2017  Navigator Location [facility]  Referral date to RadOnc/MedOnc 01/05/2017  Navigator Encounter Type Other/I received referral from Dr. Alen Blew. He would like Dr. Julien Nordmann to see Ms. Schepp. I contacted new patient coordinator, Seth Bake to schedule per Dr. Julien Nordmann in 2 weeks.  I will also contact pathology dept to send tissue for molecular testing and PDL 1.   Treatment Phase Pre-Tx/Tx Discussion  Barriers/Navigation Needs Coordination of Care  Interventions Coordination of Care  Coordination of Care Other  Acuity Level 2  Time Spent with Patient 30

## 2017-01-06 NOTE — Progress Notes (Signed)
Patient ID: Tracy Bridges, female   DOB: [birth year], 76 y.o.   MRN: [id number] BP (!) 152/49 (BP Location: Right Arm)   Pulse 69   Temp 98.7 F (37.1 C) (Oral)   Resp 18   Ht 5\' 6"  (1.676 m)   Wt 86.2 kg (190 lb 1.6 oz)   SpO2 97%   BMI 30.68 kg/m  Alert and oriented x 4, speech is clear and fluent Moving left side well, right upper extremity ~2/5 Wound is clean, dry, without signs of infection.

## 2017-01-06 NOTE — PMR Pre-admission (Signed)
PMR Admission Coordinator Pre-Admission Assessment  Patient: Tracy Bridges is an 76 y.o., female MRN: [id number] DOB: Jul 13, 1940 Height: _0  (167.6 cm) Weight: 86.2 kg (190 lb 1.6 oz)             Insurance Information HMO:     PPO: Yes     PCP:       IPA:       80/20:       OTHER:   PRIMARY:  [employer] Medicare      Policy#: [id number]      Subscriber: Shawn Stall CM Name: Lelan Pons      Phone#: [phone]     Fax#: [phone] Pre-Cert#: [id number] until 01/12/17 with update due on 01/10/17       Employer:  Retired Benefits:  Phone #: [phone]     Name: Online Eff. 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 female with history of A fib, emphysema, bronchiectasis?--uses percussion vest and nebs tid,  lung cancer who was admitted on 12/26/16 with abnormal RUE motor activity. Patient with 6 week history of progressive right sided weakness with foot drop, headaches and nausea but unable to tolerate MRI for work up. Head CT done revealing She was as well as large ovoid mass in high left frontal lobe with vasogenic edema. 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  She was started  on Keppra as well as steroids. Eliquis placed on hold and patient transitioned to IV heparin. CT abdomen/pelvis done showing no evidence of metastatic disease and incidental findings of  left thyroid nodule and moderate size HH. She underwent craniotomy for tumor resection on 12/31/16 by Dr. Christella Noa. Pathology revealed metastatic lung cancer and heme/onc input pending.  Patient with resultant right hemiplegia, lethargy, as well as deficits in processing and following commands. CIR recommended due to deficits in mobility and ability to carry out ADL tasks.    Husband supportive and has been providing physical assistance since June when she started having weakness with foot drop. He has been helping with home management.     Total: 2=NIH  Past Medical History  Past Medical History:  Diagnosis Date  . Asthma   . Atrial fibrillation ([place])   . Emphysema/COPD ([place])   . Lung cancer ([place])     Family History  family history includes Heart disease in her other; Liver cancer in her other.  Prior Rehab/Hospitalizations: No previous rehab  Has the patient had major surgery during 100 days prior to admission? No  Current Medications  Current Facility-Administered Medications:  .  0.9 %  sodium chloride infusion, , Intravenous, Continuous, Belinda Block, MD, Stopped at 12/30/16 2000 .  acetaminophen (TYLENOL) tablet 650 mg, 650 mg, Oral, Q4H PRN, 650 mg at 01/06/17 1703 **OR** acetaminophen (TYLENOL) suppository 650 mg, 650 mg, Rectal, Q4H PRN, Ashok Pall, MD .  albuterol (PROVENTIL) (2.5 MG/3ML) 0.083% nebulizer solution 3 mL, 3 mL, Inhalation, Q4H PRN, Opyd, Ilene Qua, MD .  arformoterol (BROVANA) nebulizer solution 15 mcg, 15 mcg, Nebulization, BID, Opyd, Ilene Qua, MD, 15 mcg at 01/07/17 0928 .  bisacodyl (DULCOLAX) EC tablet 5 mg, 5 mg, Oral, Daily PRN, Opyd, Timothy S, MD .  budesonide (PULMICORT) nebulizer solution 0.5 mg, 0.5 mg, Nebulization, BID, Eugenie Filler, MD, 0.5 mg at  01/07/17 0960 .  calcium-vitamin D (OSCAL WITH D) 500-200 MG-UNIT per tablet 1 tablet, 1 tablet, Oral, QHS, Opyd, Ilene Qua, MD, 1 tablet at 01/06/17 2149 .  [COMPLETED] dexamethasone (DECADRON) tablet 6 mg, 6 mg, Oral, Q6H, 6 mg at 12/31/16 1137 **FOLLOWED BY** [COMPLETED] dexamethasone (DECADRON) tablet 4 mg, 4 mg, Oral, Q6H, 4 mg at 01/01/17 1346 **FOLLOWED BY** dexamethasone (DECADRON) tablet 4 mg, 4 mg, Oral, Q8H, Ashok Pall, MD, 4 mg at 01/07/17 4540 .  diltiazem (CARDIZEM CD) 24 hr capsule 240 mg, 240 mg, Oral, Daily, Ashok Pall, MD, 240 mg at 01/07/17 0851 .  HYDROcodone-acetaminophen (NORCO/VICODIN) 5-325 MG per tablet 1-2 tablet, 1-2 tablet, Oral, Q4H PRN, Opyd, Ilene Qua, MD, 2 tablet at 01/07/17 0407 .  HYDROcodone-homatropine (HYCODAN) 5-1.5 MG/5ML syrup 5 mL, 5 mL, Oral, QHS PRN, Opyd, Timothy S, MD .  ipratropium-albuterol (DUONEB) 0.5-2.5 (3) MG/3ML nebulizer solution 3 mL, 3 mL, Nebulization, TID, Opyd, Ilene Qua, MD, 3 mL at 01/07/17 0927 .  lactated ringers infusion, , Intravenous, Continuous, Kristeen Miss, MD, Last Rate: 75 mL/hr at 01/03/17 0823 .  levETIRAcetam (KEPPRA) tablet 500 mg, 500 mg, Oral, BID, Opyd, Ilene Qua, MD, 500 mg at 01/07/17 0852 .  LORazepam (ATIVAN) injection 1-2 mg, 1-2 mg, Intravenous, Q6H PRN, Opyd, Timothy S, MD .  magnesium citrate solution 1 Bottle, 1 Bottle, Oral, Once PRN, Ashok Pall, MD .  meclizine (ANTIVERT) tablet 25 mg, 25 mg, Oral, TID, Costella, Vincent J, PA-C, 25 mg at 01/07/17 0851 .  morphine 4 MG/ML injection 1-2 mg, 1-2 mg, Intravenous, Q2H PRN, Ashok Pall, MD .  multivitamin with minerals tablet 1 tablet, 1 tablet, Oral, Daily, Opyd, Ilene Qua, MD, 1 tablet at 01/07/17 0852 .  naloxone [facility]) injection 0.08 mg, 0.08 mg, Intravenous, PRN, Ashok Pall, MD .  ondansetron (ZOFRAN) tablet 4 mg, 4 mg, Oral, Q6H PRN, 4 mg at 01/06/17 1032 **OR** ondansetron (ZOFRAN) injection 4 mg, 4 mg, Intravenous, Q6H PRN, Opyd, Ilene Qua,  MD, 4 mg at 01/07/17 0407 .  pantoprazole (PROTONIX) EC tablet 40 mg, 40 mg, Oral, BID AC, Opyd, Ilene Qua, MD, 40 mg at 01/07/17 0852 .  promethazine (PHENERGAN) injection 12.5 mg, 12.5 mg, Intravenous, Q6H PRN, Kristeen Miss, MD, 12.5 mg at 01/07/17 0853 .  promethazine (PHENERGAN) suppository 25 mg, 25 mg, Rectal, Q6H PRN, Ashok Pall, MD .  senna (SENOKOT) tablet 8.6 mg, 1 tablet, Oral, BID, Ashok Pall, MD, 8.6 mg at 01/07/17 0851 .  senna-docusate (Senokot-S) tablet 1 tablet, 1 tablet, Oral, QHS PRN, Opyd, Timothy S, MD .  sodium chloride flush (NS) 0.9 % injection 10-40 mL, 10-40 mL, Intracatheter, PRN, Hosie Poisson, MD, 10 mL at 01/06/17 0824  Patients Current Diet: Diet full liquid  Room service appropriate? Yes; Fluid consistency: Thin  Precautions / Restrictions Precautions Precautions: Fall Restrictions Weight Bearing Restrictions: No   Has the patient had 2 or more falls or a fall with injury in the past year?Yes.  Patient reports at least 3 falls associated with trying to shower.  Prior Activity Level Limited Community (1-2x/wk): Went out 3-4 times a week.  Home Assistive Devices / Equipment Home Assistive Devices/Equipment: Wheelchair, Oxygen, Radio producer (specify quad or straight), Other (Comment) Sports coach for pysiotherapy) Home Equipment: Wheelchair - manual, Environmental consultant - 2 wheels, Cane - single point, Tub bench (items belonged to mother & sister (deceased))  Prior Device Use: Indicate devices/aids used by the patient prior to current illness, exacerbation or injury? Cane and furniture walked.  Prior Functional Level Prior Function Level of Independence: Independent with assistive device(s) Comments: Per pt's family, she was independent with ADLs and IADLs several months ago, and has progressively gotten worse. Pt stated in June she was mowing lawns  Self Care: Did the patient need help bathing, dressing, using the toilet or eating?  Needed some help  Indoor Mobility: Did  the patient need assistance with walking from room to room (with or without device)? Independent  Stairs: Did the patient need assistance with internal or external stairs (with or without device)? Needed some help  Functional Cognition: Did the patient need help planning regular tasks such as shopping or remembering to take medications? Independent  Current Functional Level Cognition  Overall Cognitive Status: Impaired/Different from baseline Current Attention Level: Sustained Orientation Level: Oriented X4 Following Commands: Follows one step commands consistently, Follows one step commands with increased time Safety/Judgement: Decreased awareness of safety General Comments: Pt requiring tactile and verbal cues for grooming at sink and safe transfer technqiues.    Extremity Assessment (includes Sensation/Coordination)  Upper Extremity Assessment: RUE deficits/detail RUE Deficits / Details: Poor grasp strength and opposition (increased time to complete opposition). Able to bring hand to mouth with Min A while sitting and support at elbow and wrist.  Pt bringing toothrbush to mouth with Mod A to support elbow and wrist; requring support to bring toothbrush to back of mouth.  RUE Sensation: decreased light touch RUE Coordination: decreased fine motor, decreased gross motor  Lower Extremity Assessment: Defer to PT evaluation RLE Deficits / Details: DF, PF and knee ext 1/5; noted slight contraction unable to elicit true muscle test but able to advance RLE during amb with decreased DF RLE Sensation: decreased light touch LLE Deficits / Details: general 4-/5; sensation intact to LT    ADLs  Overall ADL's : Needs assistance/impaired Eating/Feeding: Minimal assistance, Sitting Eating/Feeding Details (indicate cue type and reason): Pt able to self feed with L hand with min A to open lids and containers Grooming: Oral care, Moderate assistance, Standing Grooming Details (indicate cue type and  reason): Mod A +2 to maintain balance at sink. Mod A to use R hand while performing oral care. Mod A to support RUE at elbow and wrist; assist to bring hand back and forth when brushing teeth instead of moving head.  Upper Body Bathing: Moderate assistance, Sitting Lower Body Bathing: Maximal assistance, Sit to/from stand Upper Body Dressing : Moderate assistance, Sitting Lower Body Dressing: Maximal assistance, Sit to/from stand Functional mobility during ADLs: Minimal assistance, +2 for safety/equipment, Rolling walker, Cueing for sequencing, Cueing for safety General ADL Comments: Pt demonstrating decreased functional performance. RUE with decreased grasp strength, coorination, ROM, and fucntional use. Pt with decreased standing balance    Mobility  Overal bed mobility: Needs Assistance Bed Mobility: Sit to Supine Sit to supine: Mod assist, +2 for physical assistance General bed mobility comments: increased time and effort, assist with trunk guidance and bilateral LEs to return to supine    Transfers  Overall transfer level: Needs assistance Equipment used: Rolling walker (2 wheeled) Transfers: Sit to/from Stand, Stand Pivot Transfers Sit to Stand: Min assist Stand pivot transfers: Mod assist, +2 physical assistance, +2 safety/equipment General transfer comment: increased time and effort, cueing for hand placement, min A to rise from chair x1 and from [facility] x1, mod A x2 for safety with pivotal movement to [facility]    Ambulation / Gait / Stairs / Wheelchair Mobility  Ambulation/Gait Ambulation/Gait assistance: Mod assist, +2 physical assistance Ambulation Distance (Feet): 10 Feet Assistive device: Rolling walker (2 wheeled) Gait Pattern/deviations: Step-to pattern, Decreased stance time - right, Decreased step length - right, Decreased step length - left, Decreased dorsiflexion - right, Decreased dorsiflexion - left, Decreased weight shift to right, Trunk flexed General Gait Details: attempted  ambulation this session, however, pt very lethargic and difficulty advancing L LE to move forwards Gait velocity: decreased Gait velocity interpretation: <1.8 ft/sec, indicative of risk for recurrent falls    Posture / Balance Balance Overall balance assessment: Needs assistance Sitting-balance support: Feet supported Sitting balance-Leahy Scale: Fair Standing balance support: During functional activity, Bilateral upper extremity supported Standing balance-Leahy Scale: Poor Standing balance comment: reliable on bilateral UEs on RW    Special needs/care consideration BiPAP/CPAP No CPM No Continuous Drip IV KVO Dialysis No      Life Vest No Oxygen Uses 02 2L [place] at night at home Special Bed No Trach Size No Wound Vac (area) No     Skin Reports moles on skin which have been checked                            Bowel mgmt: Last BM 12/30/16 Bladder mgmt: Voiding up on [facility] with assist Diabetic mgmt No    Previous Home Environment Living Arrangements: Spouse/significant other Available Help at Discharge: Family, Available 24 hours/day Type of Home: House Home Layout: One level Home Access: Stairs to enter Entrance Stairs-Rails: None Entrance Stairs-Number of Steps: 1 Bathroom Shower/Tub: Chiropodist: Handicapped height Bathroom Accessibility: Yes Home Care Services: No  Discharge Living Setting Plans for Discharge Living Setting: Patient's home, House, Lives with (comment) (Lives with husband.) Type of Home at Discharge: House Discharge Home Layout: One level Discharge Home Access: Stairs to enter Entrance Stairs-Number of Steps: 1 step to portch, then 1 step and then 1 step into kitchen entrance. Does the patient have any problems obtaining your medications?: No  Social/Family/Support Systems Patient Roles: Spouse, Parent (Has a spouse and children.) Contact Information: Elenora Fender - spouse Anticipated Caregiver: Spouse Anticipated Caregiver's Contact  Information: Patrick Jupiter - spouse - [phone] Ability/Limitations of Caregiver: Husband is retired and can assist. Building control surveyor Availability: 24/7 Discharge Plan Discussed with Primary Caregiver: Yes Is Caregiver In Agreement with Plan?: Yes Does Caregiver/Family have Issues with Lodging/Transportation while Pt is in Rehab?: No  Goals/Additional Needs Patient/Family Goal for Rehab: PT/OT/SLP supervision goals Expected length of stay: 14-16 days Cultural Considerations: [place]; husband is a Theme park manager, they just built a new church. Dietary Needs: Full liquids, thin liquids Equipment Needs: TBD Pt/Family Agrees to Admission and willing to participate: Yes Program Orientation Provided & Reviewed with Pt/Caregiver Including Roles  & Responsibilities: Yes  Decrease burden of Care  through IP rehab admission: N/A  Possible need for SNF placement upon discharge: Not planned  Patient Condition: This patient's medical and functional status has changed since the consult dated: 01/04/17 in which the Rehabilitation Physician determined and documented that the patient's condition is appropriate for intensive rehabilitative care in an inpatient rehabilitation facility. See "History of Present Illness" (above) for medical update. Functional changes are: Currently requiring min to mod assist for transfers. Patient's medical and functional status update has been discussed with the Rehabilitation physician and patient remains appropriate for inpatient rehabilitation. Will admit to inpatient rehab today.  Preadmission Screen Completed By:  Retta Diones, 01/07/2017 2:03 PM ______________________________________________________________________   Discussed status with Dr. Naaman Plummer on 01/07/17 at 1404 and received telephone approval for admission today.  Admission Coordinator:  Retta Diones, time 1404/Date 01/07/17

## 2017-01-06 NOTE — Progress Notes (Signed)
Rehab admissions - I met with patient and her husband.  They would like inpatient rehab admission.  Husband can assist at home.  I have called and opened the case with [facility].  Will await their response.  Call me for questions.  #[phone]

## 2017-01-06 NOTE — Care Management Note (Signed)
Case Management Note  Patient Details  Name: Tracy Bridges MRN: [id number] Date of Birth: 1940-10-31  Subjective/Objective:                    Action/Plan: Plan is for CIR. Awaiting insurance authorization. CM following.  Expected Discharge Date:                  Expected Discharge Plan:  Home/Self Care  In-House Referral:     Discharge planning Services  CM Consult  Post Acute Care Choice:    Choice offered to:     DME Arranged:    DME Agency:     HH Arranged:    HH Agency:     Status of Service:  In process, will continue to follow  If discussed at Long Length of Stay Meetings, dates discussed:    Additional Comments:  Pollie Friar, RN 01/06/2017, 3:41 PM

## 2017-01-07 ENCOUNTER — Encounter (HOSPITAL_COMMUNITY): Payer: Self-pay

## 2017-01-07 ENCOUNTER — Inpatient Hospital Stay (HOSPITAL_COMMUNITY)
Admission: RE | Admit: 2017-01-07 | Discharge: 2017-01-09 | DRG: 056 | Disposition: A | Discharge status: Other Acute Inpatient Hospital | Payer: Medicare HMO | Source: Intra-hospital | Attending: Physical Medicine & Rehabilitation | Admitting: Physical Medicine & Rehabilitation

## 2017-01-07 DIAGNOSIS — I959 Hypotension, unspecified: Secondary | ICD-10-CM | POA: Diagnosis not present

## 2017-01-07 DIAGNOSIS — F40240 Claustrophobia: Secondary | ICD-10-CM | POA: Diagnosis present

## 2017-01-07 DIAGNOSIS — R4701 Aphasia: Secondary | ICD-10-CM | POA: Diagnosis not present

## 2017-01-07 DIAGNOSIS — Z87891 Personal history of nicotine dependence: Secondary | ICD-10-CM | POA: Diagnosis not present

## 2017-01-07 DIAGNOSIS — J449 Chronic obstructive pulmonary disease, unspecified: Secondary | ICD-10-CM | POA: Diagnosis not present

## 2017-01-07 DIAGNOSIS — R531 Weakness: Secondary | ICD-10-CM | POA: Diagnosis not present

## 2017-01-07 DIAGNOSIS — R739 Hyperglycemia, unspecified: Secondary | ICD-10-CM | POA: Diagnosis not present

## 2017-01-07 DIAGNOSIS — Z7902 Long term (current) use of antithrombotics/antiplatelets: Secondary | ICD-10-CM | POA: Diagnosis not present

## 2017-01-07 DIAGNOSIS — G4700 Insomnia, unspecified: Secondary | ICD-10-CM | POA: Diagnosis present

## 2017-01-07 DIAGNOSIS — I82402 Acute embolism and thrombosis of unspecified deep veins of left lower extremity: Secondary | ICD-10-CM | POA: Diagnosis not present

## 2017-01-07 DIAGNOSIS — M21371 Foot drop, right foot: Secondary | ICD-10-CM | POA: Diagnosis present

## 2017-01-07 DIAGNOSIS — R609 Edema, unspecified: Secondary | ICD-10-CM | POA: Diagnosis not present

## 2017-01-07 DIAGNOSIS — R300 Dysuria: Secondary | ICD-10-CM | POA: Diagnosis not present

## 2017-01-07 DIAGNOSIS — G441 Vascular headache, not elsewhere classified: Secondary | ICD-10-CM | POA: Diagnosis present

## 2017-01-07 DIAGNOSIS — D62 Acute posthemorrhagic anemia: Secondary | ICD-10-CM | POA: Diagnosis not present

## 2017-01-07 DIAGNOSIS — D72829 Elevated white blood cell count, unspecified: Secondary | ICD-10-CM | POA: Diagnosis present

## 2017-01-07 DIAGNOSIS — J439 Emphysema, unspecified: Secondary | ICD-10-CM | POA: Diagnosis present

## 2017-01-07 DIAGNOSIS — Z9981 Dependence on supplemental oxygen: Secondary | ICD-10-CM

## 2017-01-07 DIAGNOSIS — G8191 Hemiplegia, unspecified affecting right dominant side: Principal | ICD-10-CM

## 2017-01-07 DIAGNOSIS — I4891 Unspecified atrial fibrillation: Secondary | ICD-10-CM | POA: Diagnosis present

## 2017-01-07 DIAGNOSIS — I481 Persistent atrial fibrillation: Secondary | ICD-10-CM | POA: Diagnosis not present

## 2017-01-07 DIAGNOSIS — Z85118 Personal history of other malignant neoplasm of bronchus and lung: Secondary | ICD-10-CM | POA: Diagnosis not present

## 2017-01-07 DIAGNOSIS — C7800 Secondary malignant neoplasm of unspecified lung: Secondary | ICD-10-CM | POA: Diagnosis not present

## 2017-01-07 DIAGNOSIS — Z79899 Other long term (current) drug therapy: Secondary | ICD-10-CM | POA: Diagnosis not present

## 2017-01-07 DIAGNOSIS — K219 Gastro-esophageal reflux disease without esophagitis: Secondary | ICD-10-CM | POA: Diagnosis not present

## 2017-01-07 DIAGNOSIS — M79671 Pain in right foot: Secondary | ICD-10-CM | POA: Diagnosis not present

## 2017-01-07 DIAGNOSIS — I824Z2 Acute embolism and thrombosis of unspecified deep veins of left distal lower extremity: Secondary | ICD-10-CM | POA: Diagnosis present

## 2017-01-07 DIAGNOSIS — Z90710 Acquired absence of both cervix and uterus: Secondary | ICD-10-CM

## 2017-01-07 DIAGNOSIS — R001 Bradycardia, unspecified: Secondary | ICD-10-CM | POA: Diagnosis not present

## 2017-01-07 DIAGNOSIS — I480 Paroxysmal atrial fibrillation: Secondary | ICD-10-CM | POA: Diagnosis present

## 2017-01-07 DIAGNOSIS — Z91040 Latex allergy status: Secondary | ICD-10-CM

## 2017-01-07 DIAGNOSIS — C7931 Secondary malignant neoplasm of brain: Secondary | ICD-10-CM | POA: Diagnosis present

## 2017-01-07 DIAGNOSIS — Z880 Allergy status to penicillin: Secondary | ICD-10-CM

## 2017-01-07 DIAGNOSIS — R1310 Dysphagia, unspecified: Secondary | ICD-10-CM | POA: Diagnosis not present

## 2017-01-07 DIAGNOSIS — G4733 Obstructive sleep apnea (adult) (pediatric): Secondary | ICD-10-CM | POA: Diagnosis not present

## 2017-01-07 DIAGNOSIS — Z882 Allergy status to sulfonamides status: Secondary | ICD-10-CM | POA: Diagnosis not present

## 2017-01-07 DIAGNOSIS — E876 Hypokalemia: Secondary | ICD-10-CM | POA: Diagnosis present

## 2017-01-07 DIAGNOSIS — Z881 Allergy status to other antibiotic agents status: Secondary | ICD-10-CM | POA: Diagnosis not present

## 2017-01-07 DIAGNOSIS — G936 Cerebral edema: Secondary | ICD-10-CM | POA: Diagnosis not present

## 2017-01-07 DIAGNOSIS — N182 Chronic kidney disease, stage 2 (mild): Secondary | ICD-10-CM | POA: Diagnosis present

## 2017-01-07 DIAGNOSIS — I482 Chronic atrial fibrillation: Secondary | ICD-10-CM | POA: Diagnosis not present

## 2017-01-07 DIAGNOSIS — Z800 Family history of malignant neoplasm of digestive organs: Secondary | ICD-10-CM

## 2017-01-07 DIAGNOSIS — Z887 Allergy status to serum and vaccine status: Secondary | ICD-10-CM

## 2017-01-07 DIAGNOSIS — I82409 Acute embolism and thrombosis of unspecified deep veins of unspecified lower extremity: Secondary | ICD-10-CM

## 2017-01-07 DIAGNOSIS — R4189 Other symptoms and signs involving cognitive functions and awareness: Secondary | ICD-10-CM | POA: Diagnosis present

## 2017-01-07 DIAGNOSIS — Z888 Allergy status to other drugs, medicaments and biological substances status: Secondary | ICD-10-CM

## 2017-01-07 DIAGNOSIS — R569 Unspecified convulsions: Secondary | ICD-10-CM | POA: Diagnosis not present

## 2017-01-07 DIAGNOSIS — Z801 Family history of malignant neoplasm of trachea, bronchus and lung: Secondary | ICD-10-CM

## 2017-01-07 DIAGNOSIS — K5900 Constipation, unspecified: Secondary | ICD-10-CM | POA: Diagnosis present

## 2017-01-07 DIAGNOSIS — D696 Thrombocytopenia, unspecified: Secondary | ICD-10-CM | POA: Diagnosis present

## 2017-01-07 DIAGNOSIS — I82499 Acute embolism and thrombosis of other specified deep vein of unspecified lower extremity: Secondary | ICD-10-CM | POA: Diagnosis not present

## 2017-01-07 DIAGNOSIS — Z7952 Long term (current) use of systemic steroids: Secondary | ICD-10-CM

## 2017-01-07 DIAGNOSIS — Z95828 Presence of other vascular implants and grafts: Secondary | ICD-10-CM

## 2017-01-07 DIAGNOSIS — Z886 Allergy status to analgesic agent status: Secondary | ICD-10-CM

## 2017-01-07 DIAGNOSIS — C3490 Malignant neoplasm of unspecified part of unspecified bronchus or lung: Secondary | ICD-10-CM | POA: Diagnosis not present

## 2017-01-07 DIAGNOSIS — I2510 Atherosclerotic heart disease of native coronary artery without angina pectoris: Secondary | ICD-10-CM | POA: Diagnosis not present

## 2017-01-07 DIAGNOSIS — Z902 Acquired absence of lung [part of]: Secondary | ICD-10-CM

## 2017-01-07 DIAGNOSIS — R55 Syncope and collapse: Secondary | ICD-10-CM | POA: Diagnosis not present

## 2017-01-07 DIAGNOSIS — J438 Other emphysema: Secondary | ICD-10-CM | POA: Diagnosis not present

## 2017-01-07 MED ORDER — TRAZODONE HCL 50 MG PO TABS: 25.0000 mg | ORAL_TABLET | Freq: Every evening | ORAL | Status: DC | PRN

## 2017-01-07 MED ORDER — MECLIZINE HCL 25 MG PO TABS
25.0000 mg | ORAL_TABLET | Freq: Three times a day (TID) | ORAL | Status: DC
  Administered 2017-01-07 – 2017-01-09 (×6): 25 mg via ORAL
  Filled 2017-01-07 (×6): qty 1

## 2017-01-07 MED ORDER — CALCIUM CARBONATE-VITAMIN D 500-200 MG-UNIT PO TABS
1.0000 | ORAL_TABLET | Freq: Every day | ORAL | Status: DC
  Administered 2017-01-07 – 2017-01-08 (×2): 1 via ORAL
  Filled 2017-01-07 (×2): qty 1

## 2017-01-07 MED ORDER — ACETAMINOPHEN 325 MG PO TABS
325.0000 mg | ORAL_TABLET | ORAL | Status: DC | PRN
  Administered 2017-01-07: 650 mg via ORAL
  Filled 2017-01-07: qty 2

## 2017-01-07 MED ORDER — SENNOSIDES-DOCUSATE SODIUM 8.6-50 MG PO TABS
2.0000 | ORAL_TABLET | Freq: Every day | ORAL | Status: DC
  Administered 2017-01-07 – 2017-01-08 (×2): 2 via ORAL
  Filled 2017-01-07 (×2): qty 2

## 2017-01-07 MED ORDER — NALOXONE HCL 0.4 MG/ML IJ SOLN: 0.0800 mg | INTRAMUSCULAR | Status: DC | PRN

## 2017-01-07 MED ORDER — HYDROCODONE-ACETAMINOPHEN 5-325 MG PO TABS
1.0000 | ORAL_TABLET | ORAL | Status: DC | PRN
  Administered 2017-01-07: 2 via ORAL
  Administered 2017-01-08: 1 via ORAL
  Filled 2017-01-07: qty 2
  Filled 2017-01-07: qty 1
  Filled 2017-01-07: qty 2

## 2017-01-07 MED ORDER — ALUM & MAG HYDROXIDE-SIMETH 200-200-20 MG/5ML PO SUSP: 30.0000 mL | ORAL | Status: DC | PRN

## 2017-01-07 MED ORDER — DIPHENHYDRAMINE HCL 12.5 MG/5ML PO ELIX: 12.5000 mg | ORAL_SOLUTION | Freq: Four times a day (QID) | ORAL | Status: DC | PRN

## 2017-01-07 MED ORDER — FLEET ENEMA 7-19 GM/118ML RE ENEM: 1.0000 | ENEMA | Freq: Once | RECTAL | Status: DC | PRN

## 2017-01-07 MED ORDER — ADULT MULTIVITAMIN W/MINERALS CH
1.0000 | ORAL_TABLET | Freq: Every day | ORAL | Status: DC
  Administered 2017-01-08 – 2017-01-09 (×2): 1 via ORAL
  Filled 2017-01-07 (×2): qty 1

## 2017-01-07 MED ORDER — PROMETHAZINE HCL 25 MG/ML IJ SOLN: 12.5000 mg | Freq: Four times a day (QID) | INTRAMUSCULAR | Status: DC | PRN

## 2017-01-07 MED ORDER — PROMETHAZINE HCL 12.5 MG RE SUPP: 12.5000 mg | Freq: Four times a day (QID) | RECTAL | Status: DC | PRN

## 2017-01-07 MED ORDER — PROMETHAZINE HCL 12.5 MG PO TABS
12.5000 mg | ORAL_TABLET | Freq: Four times a day (QID) | ORAL | Status: DC | PRN
  Administered 2017-01-07 – 2017-01-08 (×3): 12.5 mg via ORAL
  Filled 2017-01-07 (×3): qty 1

## 2017-01-07 MED ORDER — LEVETIRACETAM 500 MG PO TABS
500.0000 mg | ORAL_TABLET | Freq: Two times a day (BID) | ORAL | Status: DC
  Administered 2017-01-07 – 2017-01-09 (×4): 500 mg via ORAL
  Filled 2017-01-07 (×4): qty 1

## 2017-01-07 MED ORDER — IPRATROPIUM-ALBUTEROL 0.5-2.5 (3) MG/3ML IN SOLN
3.0000 mL | Freq: Three times a day (TID) | RESPIRATORY_TRACT | Status: DC
  Administered 2017-01-07 – 2017-01-09 (×6): 3 mL via RESPIRATORY_TRACT
  Filled 2017-01-07 (×7): qty 3

## 2017-01-07 MED ORDER — LORAZEPAM 2 MG/ML IJ SOLN: 1.0000 mg | Freq: Four times a day (QID) | INTRAMUSCULAR | Status: DC | PRN

## 2017-01-07 MED ORDER — BISACODYL 10 MG RE SUPP: 10.0000 mg | Freq: Every day | RECTAL | Status: DC | PRN

## 2017-01-07 MED ORDER — DEXAMETHASONE 4 MG PO TABS
4.0000 mg | ORAL_TABLET | Freq: Three times a day (TID) | ORAL | Status: DC
  Administered 2017-01-07 – 2017-01-09 (×6): 4 mg via ORAL
  Filled 2017-01-07 (×6): qty 1

## 2017-01-07 MED ORDER — PANTOPRAZOLE SODIUM 40 MG PO TBEC
40.0000 mg | DELAYED_RELEASE_TABLET | Freq: Two times a day (BID) | ORAL | Status: DC
  Administered 2017-01-08 – 2017-01-09 (×3): 40 mg via ORAL
  Filled 2017-01-07 (×3): qty 1

## 2017-01-07 MED ORDER — ALBUTEROL SULFATE (2.5 MG/3ML) 0.083% IN NEBU: 3.0000 mL | INHALATION_SOLUTION | RESPIRATORY_TRACT | Status: DC | PRN

## 2017-01-07 MED ORDER — DILTIAZEM HCL ER COATED BEADS 240 MG PO CP24
240.0000 mg | ORAL_CAPSULE | Freq: Every day | ORAL | Status: DC
  Administered 2017-01-08 – 2017-01-09 (×2): 240 mg via ORAL
  Filled 2017-01-07 (×2): qty 1

## 2017-01-07 MED ORDER — ARFORMOTEROL TARTRATE 15 MCG/2ML IN NEBU
15.0000 ug | INHALATION_SOLUTION | Freq: Two times a day (BID) | RESPIRATORY_TRACT | Status: DC
  Administered 2017-01-07 – 2017-01-09 (×4): 15 ug via RESPIRATORY_TRACT
  Filled 2017-01-07 (×4): qty 2

## 2017-01-07 MED ORDER — BUDESONIDE 0.5 MG/2ML IN SUSP
0.5000 mg | Freq: Two times a day (BID) | RESPIRATORY_TRACT | Status: DC
  Administered 2017-01-07 – 2017-01-09 (×4): 0.5 mg via RESPIRATORY_TRACT
  Filled 2017-01-07 (×4): qty 2

## 2017-01-07 MED ORDER — POLYETHYLENE GLYCOL 3350 17 G PO PACK: 17.0000 g | PACK | Freq: Every day | ORAL | Status: DC | PRN

## 2017-01-07 NOTE — Progress Notes (Signed)
Patient and family were informed about rehab process including patient safety plan and rehab book.

## 2017-01-07 NOTE — Progress Notes (Signed)
Occupational Therapy Treatment Patient Details Name: Tracy Bridges MRN: [id number] DOB: 12-20-1940 Today's Date: 01/07/2017    History of present illness Pt is a 76 y.o. 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 H/o COPD, A-fib, lung CA, and CKD stage II.    OT comments  Pt progressing towards goals. Pt completed standing grooming ADLs at sink with ModA +2 for maintaining static standing balance and MinA for completing task. Pt requires MaxA +2 for completing stand pivot transfers to/from [facility] at RW level and total assist (+2) for toileting. Pt continues to demonstrate motivation and willingness to work with therapy. Feel Pt will benefit from CIR level services after discharge to progress Pt to PLOF, will continue to follow acutely to progress Pt towards established goals.    Follow Up Recommendations  CIR    Equipment Recommendations  Other (comment) (defer to next venue )          Precautions / Restrictions Precautions Precautions: Fall Restrictions Weight Bearing Restrictions: No       Mobility Bed Mobility               General bed mobility comments: OOB in recliner upon entering room   Transfers Overall transfer level: Needs assistance Equipment used: Rolling walker (2 wheeled) Transfers: Sit to/from [facility] Sit to Stand: Mod assist;+2 physical assistance;+2 safety/equipment Stand pivot transfers: Max assist;+2 physical assistance;+2 safety/equipment       General transfer comment: increased time and effort, cueing for hand placement, ModA +2 to rise from chair x2, from [facility] x1 with verbal cues for safe hand placement; max A +2 to provide steady assist and to facilitate advancing RLE with pivotal movement to/from [facility]    Balance Overall balance assessment: Needs assistance Sitting-balance support: Feet supported;Bilateral upper extremity supported Sitting balance-Leahy Scale: Fair     Standing balance support: During  functional activity;Bilateral upper extremity supported Standing balance-Leahy Scale: Poor Standing balance comment: reliable on bilateral UEs on RW and external support from therapist                            ADL either performed or assessed with clinical judgement   ADL Overall ADL's : Needs assistance/impaired     Grooming: Oral care;Moderate assistance;Standing;Wash/dry face Grooming Details (indicate cue type and reason): Mod A +2 to maintain balance at sink. Pt using LUE to complete task with assist for opening containters, obtaining items, applying toothpaste. Pt bearing weight on forearms during task completion for added support                  Toilet Transfer: Maximal assistance;+2 for physical assistance;+2 for safety/equipment;Stand-pivot;BSC;RW Toilet Transfer Details (indicate cue type and reason): maxA +2 to steady as well as assist to advance RLE during transfer; verbal cues for hand placement when preparing to rise/descend  Toileting- Clothing Manipulation and Hygiene: +2 for physical assistance;+2 for safety/equipment;Sit to/from stand;Total assistance Toileting - Clothing Manipulation Details (indicate cue type and reason): total assist for perihygiene after toileting with additional assist (+2) providing support to maintain standing balance      Functional mobility during ADLs: Maximal assistance;+2 for physical assistance;+2 for safety/equipment;Rolling walker General ADL Comments: cotinues to demonstrate decreased functional performance, RUE graps strength, ROM and functional use; Pt demonstrates R finger opposition with increased time/effort, educated Pt to continue completing task for increased stregnth/FMC; Pt with increased dizziness after standing grooming, BP taken and 149/63 in sitting, symptoms  subsiding with rest.                        Cognition Arousal/Alertness: Awake/alert Behavior During Therapy: Flat affect Overall Cognitive  Status: Impaired/Different from baseline Area of Impairment: Problem solving;Following commands;Attention;Awareness;Safety/judgement                   Current Attention Level: Sustained   Following Commands: Follows one step commands consistently;Follows one step commands with increased time Safety/Judgement: Decreased awareness of safety Awareness: Intellectual Problem Solving: Slow processing;Requires verbal cues;Requires tactile cues General Comments: Pt requiring tactile and verbal cues for safe transfer techniques         Exercises Hand Exercises Opposition: AROM;Right;Seated          General Comments Pt spouse present intermittently during session     Pertinent Vitals/ Pain       Pain Assessment: Faces Faces Pain Scale: Hurts a little bit Pain Location: LUE (at PICC line site)  Pain Descriptors / Indicators: Tender Pain Intervention(s): Monitored during session                                                          Frequency  Min 3X/week        Progress Toward Goals  OT Goals(current goals can now be found in the care plan section)  Progress towards OT goals: Progressing toward goals  Acute Rehab OT Goals Patient Stated Goal: decrease pain and return home; to walk  OT Goal Formulation: With patient Time For Goal Achievement: 01/18/17 Potential to Achieve Goals: Good  Plan Discharge plan remains appropriate    Co-evaluation    PT/OT/SLP Co-Evaluation/Treatment: Yes Reason for Co-Treatment: Complexity of the patient's impairments (multi-system involvement);For patient/therapist safety;To address functional/ADL transfers PT goals addressed during session: Mobility/safety with mobility OT goals addressed during session: ADL's and self-care      AM-PAC PT "6 Clicks" Daily Activity     Outcome Measure   Help from another person eating meals?: A Little Help from another person taking care of personal grooming?: A  Little Help from another person toileting, which includes using toliet, bedpan, or urinal?: A Lot Help from another person bathing (including washing, rinsing, drying)?: A Lot Help from another person to put on and taking off regular upper body clothing?: A Lot Help from another person to put on and taking off regular lower body clothing?: A Lot 6 Click Score: 14    End of Session Equipment Utilized During Treatment: Gait belt;Rolling walker  OT Visit Diagnosis: Unsteadiness on feet (R26.81);Other abnormalities of gait and mobility (R26.89);Muscle weakness (generalized) (M62.81);Other symptoms and signs involving cognitive function;Hemiplegia and hemiparesis Hemiplegia - Right/Left: Right Hemiplegia - dominant/non-dominant: Dominant 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

## 2017-01-07 NOTE — Progress Notes (Addendum)
Rehab admissions - I have approval for inpatient rehab admission from insurance carrier.  I am waiting to see if I can procure a rehab bed today.  Call me for questions.  [phone]  A bed has become available on CIR.  Will admit to inpatient rehab today.  #[phone]

## 2017-01-07 NOTE — Progress Notes (Signed)
Physical Therapy Treatment Patient Details Name: Tracy Bridges MRN: [id number] DOB: September 16, 1940 Today's Date: 01/07/2017    History of Present Illness Pt is a 76 y.o. 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 H/o COPD, A-fib, lung CA, and CKD stage II.     PT Comments    Pt progressing towards current functional mobility goals. She continues to demonstrate weakness of R LE and lethargy. She continues to require two person physical assistance for transfers with RW. Pt tolerated standing at sink to perform ADL task with mod A x2 for stability. Pt remains an excellent candidate for CIR for further intensive therapy services. PT will continue to follow acutely.    Follow Up Recommendations  CIR     Equipment Recommendations  None recommended by PT    Recommendations for Other Services       Precautions / Restrictions Precautions Precautions: Fall Restrictions Weight Bearing Restrictions: No    Mobility  Bed Mobility               General bed mobility comments: OOB in recliner upon entering room   Transfers Overall transfer level: Needs assistance Equipment used: Rolling walker (2 wheeled) Transfers: Sit to/from [facility] Sit to Stand: Mod assist;+2 physical assistance;+2 safety/equipment Stand pivot transfers: Max assist;+2 physical assistance;+2 safety/equipment       General transfer comment: increased time and effort, cueing for hand placement, ModA +2 to rise from chair x2, from [facility] x1 with verbal cues for safe hand placement; max A +2 to provide steady assist and to facilitate advancing RLE with pivotal movement to/from [facility]  Ambulation/Gait                 Stairs            Wheelchair Mobility    Modified Rankin (Stroke Patients Only)       Balance Overall balance assessment: Needs assistance Sitting-balance support: Feet supported;Bilateral upper extremity supported Sitting balance-Leahy Scale: Fair     Standing balance support: During functional activity;Bilateral upper extremity supported Standing balance-Leahy Scale: Poor Standing balance comment: reliable on bilateral UEs on RW and external support from therapist                             Cognition Arousal/Alertness: Awake/alert Behavior During Therapy: Flat affect Overall Cognitive Status: Impaired/Different from baseline Area of Impairment: Problem solving;Following commands;Attention;Awareness;Safety/judgement                   Current Attention Level: Sustained   Following Commands: Follows one step commands consistently;Follows one step commands with increased time Safety/Judgement: Decreased awareness of safety Awareness: Intellectual Problem Solving: Slow processing;Requires verbal cues;Requires tactile cues General Comments: Pt requiring tactile and verbal cues for safe transfer techniques       Exercises Hand Exercises Opposition: AROM;Right;Seated    General Comments General comments (skin integrity, edema, etc.): Pt spouse present intermittently during session       Pertinent Vitals/Pain Pain Assessment: No/denies pain Faces Pain Scale: Hurts a little bit Pain Location: LUE (at PICC line site)  Pain Descriptors / Indicators: Tender Pain Intervention(s): Monitored during session    Home Living                      Prior Function            PT Goals (current goals can now be found in the care plan  section) Acute Rehab PT Goals Patient Stated Goal: decrease pain and return home; to walk  PT Goal Formulation: With patient Time For Goal Achievement: 01/17/17 Potential to Achieve Goals: Good Progress towards PT goals: Progressing toward goals    Frequency    Min 3X/week      PT Plan Current plan remains appropriate    Co-evaluation PT/OT/SLP Co-Evaluation/Treatment: Yes Reason for Co-Treatment: For patient/therapist safety;To address functional/ADL transfers PT  goals addressed during session: Mobility/safety with mobility;Balance;Proper use of DME;Strengthening/ROM OT goals addressed during session: ADL's and self-care      AM-PAC PT "6 Clicks" Daily Activity  Outcome Measure  Difficulty turning over in bed (including adjusting bedclothes, sheets and blankets)?: A Lot Difficulty moving from lying on back to sitting on the side of the bed? : A Lot Difficulty sitting down on and standing up from a chair with arms (e.g., wheelchair, bedside commode, etc,.)?: Unable Help needed moving to and from a bed to chair (including a wheelchair)?: A Lot Help needed walking in hospital room?: A Lot Help needed climbing 3-5 steps with a railing? : Total 6 Click Score: 10    End of Session Equipment Utilized During Treatment: Gait belt Activity Tolerance: Patient limited by fatigue;Patient limited by lethargy Patient left: in chair;with call bell/phone within reach;with family/visitor present Nurse Communication: Mobility status PT Visit Diagnosis: Muscle weakness (generalized) (M62.81);Difficulty in walking, not elsewhere classified (R26.2)     Time: 1351-1430 PT Time Calculation (min) (ACUTE ONLY): 39 min  Charges:  $Therapeutic Activity: 23-37 mins                    G Codes:       [place], [place], [place] 01/07/2017, 4:49 PM

## 2017-01-07 NOTE — Discharge Instructions (Signed)
Craniotomy °Care After °Please read the instructions outlined below and refer to this sheet in the next few weeks. These discharge instructions provide you with general information on caring for yourself after you leave the hospital. Your surgeon may also give you specific instructions. While your treatment has been planned according to the most current medical practices available, unavoidable complications occasionally occur. If you have any problems or questions after discharge, please call your surgeon. °Although there are many types of brain surgery, recovery following craniotomy (surgical opening of the skull) is much the same for each. However, recovery depends on many factors. These include the type and severity of brain injury and the type of surgery. It also depends on any nervous system function problems (neurological deficits) before surgery. If the craniotomy was done for cancer, chemotherapy and radiation could follow. You could be in the hospital from 5 days to a couple weeks. This depends on the type of surgery, findings, and whether there are complications. °HOME CARE INSTRUCTIONS  °· It is not unusual to hear a clicking noise after a craniotomy, the plates and screws used to attach the bone flap can sometimes cause this. It is a normal occurrence if this does happen °· Do not drive for 10 days after the operation °· Your scalp may feel spongy for a while, because of fluid under it. This will gradually get better. Occasionally, the surgeon will not replace the bone that was removed to access the brain. If there is a bony defect, the surgeon will ask you to wear a helmet for protection. This is a discussion you should have with your surgeon prior to leaving the hospital (discharge). °· Numbness may persist in some areas of your scalp. °· Take all medications as directed. Sometimes steroids to control swelling are prescribed. Anticonvulsants to prevent seizures may also be given. Do not use alcohol,  other drugs, or medications unless your surgeon says it is OK. °· Keep the wound dry and clean. The wound may be washed gently with soap and water. Then, you may gently blot or dab it dry, without rubbing. Do not take baths, use swimming pools or hot tubs for 10 days, or as instructed by your caregiver. It is best to wait to see you surgeon at your first postoperative visit, and to get directions at that time. °· Only take over-the-counter or prescription medicines for pain, discomfort, or fever as directed by your caregiver. °· You may continue your normal diet, as directed. °· Walking is OK for exercise. Wait at least 3 months before you return to mild, non-contact sports or as your surgeon suggests. Contact sports should be avoided for at least 1 year, unless your surgeon says it is OK. °· If you are prescribed steroids, take them exactly as prescribed. If you start having a decrease in nervous system functions (neurological deficits) and headaches as the dose of steroids is reduced, tell your surgeon right away. °· When the anticonvulsant prescription is finished you no longer need to take it. °SEEK IMMEDIATE MEDICAL CARE IF:  °· You develop nausea, vomiting, severe headaches, confusion, or you have a seizure. °· You develop chest pain, a stiff neck, or difficulty breathing. °· There is redness, swelling, or increasing pain in the wound or pin insertion sites. °· You have an increase in swelling or bruising around the eyes. °· There is drainage or pus coming from the wound. °· You have an oral temperature above 102° F (38.9° C), not controlled by medicine. °·   You notice a foul smell coming from the wound or dressing. °· The wound breaks open (edges not staying together) after the stitches have been removed. °· You develop dizziness or fainting while standing. °· You develop a rash. °· You develop any reaction or side effects to the medications given. °Document Released: 06/22/2005 Document Revised: 06/14/2011  Document Reviewed: 03/31/2009 °[employer]® Patient Information ©2013 [employer]. ° °

## 2017-01-07 NOTE — H&P (Signed)
Physical Medicine and Rehabilitation Admission H&P       Chief Complaint  Patient presents with  . Deficits in mobility and ADLs    HPI:   Tracy S Jonesis a 76 y.o.femalewith history of A fib, emphysema, bronchiectasis?--uses percussion vest and nebs tid, lung cancer who was admitted on 12/26/16 with abnormalRUE motor activity. Patient with 6 week history of progressive right sided weakness with foot drop, headaches and nausea but unable to tolerate MRI for work up. Head CT done revealing She was as well as large ovoid mass in high left frontal lobe with vasogenic edema. 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 She was started on Keppra as well as steroids. Eliquis placed on hold and patient transitioned to IV heparin. CT abdomen/pelvis done showing no evidence of metastatic disease and incidental findings of left thyroid nodule and moderate size HH. She underwent craniotomy for tumor resection on 12/31/16 by Dr. Christella Noa. Pathology revealed metastatic lung cancer and Dr. Alen Blew consulted for input. He reheme/onc input pending. Patient with resultant right hemiplegia, lethargy, as well as deficits in processing and following commands. CIR recommended due to deficits in mobility and ability to carry out ADL tasks.     Review of Systems  Constitutional: Negative for chills and fever.  HENT: Negative for hearing loss and tinnitus.   Eyes: Negative for blurred vision and double vision.  Respiratory: Negative for cough, shortness of breath and wheezing.   Cardiovascular: Negative for chest pain and palpitations.  Gastrointestinal: Positive for constipation, nausea and vomiting. Negative for abdominal pain.  Genitourinary: Negative for dysuria and urgency.  Musculoskeletal: Negative for back pain and myalgias.  Skin: Negative for itching and rash.  Neurological:  Positive for dizziness, sensory change, focal weakness, weakness and headaches.  Psychiatric/Behavioral: The patient has insomnia (due to pain/nausea).           Past Medical History:  Diagnosis Date  . Asthma   . Atrial fibrillation ([place])   . Emphysema/COPD ([place])   . Lung cancer [facility])         Past Surgical History:  Procedure Laterality Date  . ABDOMINAL HYSTERECTOMY    . APPLICATION OF CRANIAL NAVIGATION Left 12/30/2016   Procedure: APPLICATION OF CRANIAL NAVIGATION;  Surgeon: Ashok Pall, MD;  Location: [place];  Service: Neurosurgery;  Laterality: Left;  APPLICATION OF CRANIAL NAVIGATION  . CESAREAN SECTION    . CRANIOTOMY Left 12/30/2016   Procedure: CRANIOTOMY TUMOR EXCISION;  Surgeon: Ashok Pall, MD;  Location: [place];  Service: Neurosurgery;  Laterality: Left;  CRANIOTOMY TUMOR EXCISION  . HERNIA REPAIR    . LUNG REMOVAL, PARTIAL  04/1997         Family History  Problem Relation Age of Onset  . Liver cancer Other   . Heart disease Other   . Brain cancer Neg Hx     Social History:  Married. Has needed assistance since June with ADLs and has been furniture walking due to RLE weakness and instability. Has supportive family. She reports that she quit smoking about 38 years ago. Her smoking use included Cigarettes. She has never used smokeless tobacco. She reports that she does not drink alcohol or use drugs.        Allergies  Allergen Reactions  . Aspirin Shortness Of Breath  . Atenolol Swelling    Throat swelled and closed up  . Boniva [Ibandronic  Acid] Other (See Comments)    Pt does not remember specific reaction  . Fluticasone Anaphylaxis and Hives  . Fosamax [Alendronate Sodium] Other (See Comments)    Pt does not remember specific reaction  . Latex Swelling and Rash    Throat swelled and closed up  . Mucinex [Guaifenesin Er] Anaphylaxis  . Rocephin [Ceftriaxone Sodium In Dextrose] Anaphylaxis and Rash  . Rotateq  [Rotavirus Vaccine Live Oral] Other (See Comments)    "poorly tolerated"  . Sulfa Antibiotics Other (See Comments)    Pt does not remember specific reaction  . Vancomycin Other (See Comments)    Pt does not remember specific reaction  . Penicillins Rash    Childhood reaction Has patient had a PCN reaction causing immediate rash, facial/tongue/throat swelling, SOB or lightheadedness with hypotension: Yes Has patient had a PCN reaction causing severe rash involving mucus membranes or skin necrosis: No Has patient had a PCN reaction that required hospitalization: Unknown Has patient had a PCN reaction occurring within the last 10 years: No If all of the above answers are "NO", then may proceed with Cephalosporin use.  . Tobramycin Sulfate Rash          Medications Prior to Admission  Medication Sig Dispense Refill  . acetaminophen (TYLENOL) 500 MG tablet Take 1,000 mg by mouth every 6 (six) hours as needed for headache (pain).    Marland Kitchen albuterol (PROAIR HFA) 108 (90 Base) MCG/ACT inhaler Inhale 1-2 puffs into the lungs every 4 (four) hours as needed for wheezing or shortness of breath.    . Calcium Carbonate-Vitamin D (CALCIUM-D PO) Take 1 tablet by mouth at bedtime.    . ciprofloxacin (CIPRO) 500 MG tablet Take 500 mg by mouth See admin instructions. Take 1 tablet (500 mg) by mouth twice daily for 10 days as needed for infection    . clopidogrel (PLAVIX) 75 MG tablet Take 75 mg by mouth at bedtime.     Marland Kitchen diltiazem (CARDIZEM LA) 240 MG 24 hr tablet Take 1 tablet (240 mg total) by mouth daily. (Patient taking differently: Take 240 mg by mouth at bedtime. ) 30 tablet 0  . EPINEPHrine (EPIPEN) 0.3 mg/0.3 mL SOAJ injection Inject 0.3 mg into the muscle once as needed (severe allergic reaction).     . furosemide (LASIX) 20 MG tablet Take 20 mg by mouth daily as needed for fluid or edema.    Marland Kitchen HYDROcodone-homatropine (HYCODAN) 5-1.5 MG/5ML syrup Take 5 mLs by mouth at bedtime as  needed for cough.    Marland Kitchen ipratropium-albuterol (DUONEB) 0.5-2.5 (3) MG/3ML SOLN Take 3 mLs by nebulization 3 (three) times daily.     . Multiple Vitamin (MULTIVITAMIN WITH MINERALS) TABS tablet Take 1 tablet by mouth daily.    Marland Kitchen omeprazole (PRILOSEC) 20 MG capsule Take 20 mg by mouth 2 (two) times daily.    . OXYGEN Inhale 2 L into the lungs at bedtime.    . predniSONE (DELTASONE) 10 MG tablet Take 10 mg by mouth every other day.    Marland Kitchen PRESCRIPTION MEDICATION Inject into the muscle See admin instructions. 2 allergy shots weekly by Dr. Fransico Michael, [place], [place]    . salmeterol (SEREVENT DISKUS) 50 MCG/DOSE diskus inhaler Inhale 1 puff into the lungs 2 (two) times daily as needed (shortness of breath).     . Vitamin D, Ergocalciferol, (DRISDOL) 50000 UNITS CAPS capsule Take 50,000 Units by mouth See admin instructions. Take 1 capsule (50,000 units) by mouth every other Saturday    . [  DISCONTINUED] Triamcinolone Acetonide (AZMACORT IN) Inhale 2 puffs into the lungs 4 (four) times daily as needed (shortness of breath).      Drug Regimen Review  Drug regimen was reviewed and remains appropriate with no significant issues identified  Home: Home Living Family/patient expects to be discharged to:: Private residence Living Arrangements: Spouse/significant other Available Help at Discharge: Family, Available 24 hours/day Type of Home: House Home Access: Stairs to enter [employer] of Steps: 1 Entrance Stairs-Rails: None Home Layout: One level Bathroom Shower/Tub: Chiropodist: Handicapped height Bathroom Accessibility: Yes Home Equipment: Wheelchair - manual, Environmental consultant - 2 wheels, Cane - single point, Tub bench (items belonged to mother & sister (deceased))   Functional History: Prior Function Level of Independence: Independent with assistive device(s) Comments: Per pt's family, she was independent with ADLs and IADLs several months ago, and has  progressively gotten worse. Pt stated in June she was mowing lawns  Functional Status:  Mobility: Bed Mobility Overal bed mobility: Needs Assistance Bed Mobility: Sit to Supine Sit to supine: Mod assist, +2 for physical assistance General bed mobility comments: increased time and effort, assist with trunk guidance and bilateral LEs to return to supine Transfers Overall transfer level: Needs assistance Equipment used: Rolling walker (2 wheeled) Transfers: Sit to/from Stand, Stand Pivot Transfers Sit to Stand: Min assist Stand pivot transfers: Mod assist, +2 physical assistance, +2 safety/equipment General transfer comment: increased time and effort, cueing for hand placement, min A to rise from chair x1 and from [facility] x1, mod A x2 for safety with pivotal movement to [facility] Ambulation/Gait Ambulation/Gait assistance: Mod assist, +2 physical assistance Ambulation Distance (Feet): 10 Feet Assistive device: Rolling walker (2 wheeled) Gait Pattern/deviations: Step-to pattern, Decreased stance time - right, Decreased step length - right, Decreased step length - left, Decreased dorsiflexion - right, Decreased dorsiflexion - left, Decreased weight shift to right, Trunk flexed General Gait Details: attempted ambulation this session, however, pt very lethargic and difficulty advancing L LE to move forwards Gait velocity: decreased Gait velocity interpretation: <1.8 ft/sec, indicative of risk for recurrent falls  ADL: ADL Overall ADL's : Needs assistance/impaired Eating/Feeding: Minimal assistance, Sitting Eating/Feeding Details (indicate cue type and reason): Pt able to self feed with L hand with min A to open lids and containers Grooming: Oral care, Moderate assistance, Standing Grooming Details (indicate cue type and reason): Mod A +2 to maintain balance at sink. Mod A to use R hand while performing oral care. Mod A to support RUE at elbow and wrist; assist to bring hand back and forth when  brushing teeth instead of moving head.  Upper Body Bathing: Moderate assistance, Sitting Lower Body Bathing: Maximal assistance, Sit to/from stand Upper Body Dressing : Moderate assistance, Sitting Lower Body Dressing: Maximal assistance, Sit to/from stand Functional mobility during ADLs: Minimal assistance, +2 for safety/equipment, Rolling walker, Cueing for sequencing, Cueing for safety General ADL Comments: Pt demonstrating decreased functional performance. RUE with decreased grasp strength, coorination, ROM, and fucntional use. Pt with decreased standing balance  Cognition: Cognition Overall Cognitive Status: Impaired/Different from baseline Orientation Level: Oriented X4 Cognition Arousal/Alertness: Lethargic Behavior During Therapy: Flat affect Overall Cognitive Status: Impaired/Different from baseline Area of Impairment: Problem solving, Following commands, Attention, Awareness, Safety/judgement Current Attention Level: Sustained Following Commands: Follows one step commands consistently, Follows one step commands with increased time Safety/Judgement: Decreased awareness of safety Awareness: Intellectual Problem Solving: Slow processing, Requires verbal cues, Requires tactile cues General Comments: Pt requiring tactile and verbal cues for grooming at  sink and safe transfer technqiues.   Blood pressure (!) 126/48, pulse (!) 59, temperature 98.2 F (36.8 C), temperature source Oral, resp. rate 16, height 5\' 6"  (1.676 m), weight 86.2 kg (190 lb 1.6 oz), SpO2 91 %. Physical Exam  Nursing note and vitals reviewed. Constitutional: She is oriented to person, place, and time. She appears well-developed and well-nourished. No distress.  HENT:  Head: Normocephalic and atraumatic.  Eyes: Pupils are equal, round, and reactive to light. Conjunctivae are normal. No scleral icterus.  Neck: Normal range of motion. Neck supple.  Cardiovascular: Normal rate and regular rhythm.   No murmur  heard. Respiratory: Effort normal and breath sounds normal. No stridor. No respiratory distress. She has no wheezes.  GI: Soft. Bowel sounds are normal. She exhibits no distension. There is no tenderness.  Musculoskeletal: She exhibits edema. She exhibits no tenderness.  Neurological: She is alert and oriented to person, place, and time.  Speech clear. Light sensitivity decreased and now able to keep eyes open without any problems.  Able to follow simple motor command without difficulty. Right hemiparesis with sensory deficits. RUE 2/5 deltoid, biceps, triceps, wrist, 1+ HI. RLE: 1+/5 HF 1+ KE, tr to 0/5 ADF/PF. emerging tone RLE. Sensation 1/2 Right arm/leg. Can sense deep pain stim.   Skin: Skin is warm and dry. She is not diaphoretic.  Psychiatric: She has a normal mood and affect. Her behavior is normal. Thought content normal.    Lab Results Last 48 Hours  No results found for this or any previous visit (from the past 48 hour(s)).   Imaging Results (Last 48 hours)  No results found.       Medical Problem List and Plan: 1.  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  DVT Prophylaxis/Anticoagulation: Mechanical: Sequential compression devices, below knee Bilateral lower extremities Will check dopplers with dense RLE weakness 3. Headaches/Pain Management: Headaches persistent and being managed with prn hydocodone.  4. Mood: LCSW to follow for evaluation and support.  5. Neuropsych: This patient is capable of making decisions on her own behalf. 6. Skin/Wound Care: routine pressure relief measures. Advance diet to soft--GI issues could be due to steroids and poor intake of liquid diet.  7. Fluids/Electrolytes/Nutrition: Monitor I/O. Check lytes in am--noted to be dehydrated on last check. .  8.  Nausea: Likely central. Continue full liquid diet. Zofran not effective--continue promethizine prn.    9. H/o A Fib: Managed with diltiazem. Monitor HR bid.  10. Emphysema/Bronchiectasis: continue nebulizers. Will resume chest PT bid as at home. Continue oxygen at nights and prn during the day.  11. Leucocytosis: Monitor for signs of infection. Question steroid effect. Check Ua/UCs   Post Admission Physician Evaluation: 1. Functional deficits secondary  to metastatic lung ca. 2. Patient is admitted to receive collaborative, interdisciplinary care between the physiatrist, rehab nursing staff, and therapy team. 3. Patient's level of medical complexity and substantial therapy needs in context of that medical necessity cannot be provided at a lesser intensity of care such as a SNF. 4. Patient has experienced substantial functional loss from his/her baseline which was documented above under the "Functional History" and "Functional Status" headings.  Judging by the patient's diagnosis, physical exam, and functional history, the patient has potential for functional progress which will result in measurable gains while on inpatient rehab.  These gains will be of substantial and  practical use upon discharge  in facilitating mobility and self-care at the household level. 5. Physiatrist will provide 24 hour management of medical needs as well as oversight of the therapy plan/treatment and provide guidance as appropriate regarding the interaction of the two. 6. The Preadmission Screening has been reviewed and patient status is unchanged unless otherwise stated above. 7. 24 hour rehab nursing will assist with bladder management, bowel management, safety, skin/wound care, disease management, medication administration, pain management and patient education  and help integrate therapy concepts, techniques,education, etc. 8. PT will assess and treat for/with: Lower extremity strength, range of motion, stamina, balance, functional mobility, safety, adaptive techniques and equipment, NMR, family education w/c  assessment.   Goals are: supervision. 9. OT will assess and treat for/with: ADL's, functional mobility, safety, upper extremity strength, adaptive techniques and equipment, NMR, family education ego support.   Goals are: supervision. Therapy may proceed with showering this patient. 10. SLP will assess and treat for/with: cognition, communication.  Goals are: supervision to mod I. 11. Case Management and Social Worker will assess and treat for psychological issues and discharge planning. 12. Team conference will be held weekly to assess progress toward goals and to determine barriers to discharge. 13. Patient will receive at least 3 hours of therapy per day at least 5 days per week. 14. ELOS: 14-17 days       15. Prognosis:  good     Meredith Staggers, MD, [place] [facility] 01/07/2017  Bary Leriche, Hershal Coria 01/07/2017

## 2017-01-07 NOTE — H&P (Signed)
Physical Medicine and Rehabilitation Admission H&P    Chief Complaint  Patient presents with  . Deficits in mobility and ADLs    HPI:   Tracy Bridges is a 76 y.o. female with history of A fib, emphysema, bronchiectasis?--uses percussion vest and nebs tid,  lung cancer who was admitted on 12/26/16 with abnormal RUE motor activity. Patient with 6 week history of progressive right sided weakness with foot drop, headaches and nausea but unable to tolerate MRI for work up. Head CT done revealing She was as well as large ovoid mass in high left frontal lobe with vasogenic edema. 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  She was started on Keppra as well as steroids. Eliquis placed on hold and patient transitioned to IV heparin. CT abdomen/pelvis done showing no evidence of metastatic disease and incidental findings of  left thyroid nodule and moderate size HH. She underwent craniotomy for tumor resection on 12/31/16 by Dr. Christella Noa. Pathology revealed metastatic lung cancer and Dr. Alen Blew consulted for input. He reheme/onc input pending.  Patient with resultant right hemiplegia, lethargy, as well as deficits in processing and following commands. CIR recommended due to deficits in mobility and ability to carry out ADL tasks.      Review of Systems  Constitutional: Negative for chills and fever.  HENT: Negative for hearing loss and tinnitus.   Eyes: Negative for blurred vision and double vision.  Respiratory: Negative for cough, shortness of breath and wheezing.   Cardiovascular: Negative for chest pain and palpitations.  Gastrointestinal: Positive for constipation, nausea and vomiting. Negative for abdominal pain.  Genitourinary: Negative for dysuria and urgency.  Musculoskeletal: Negative for back pain and myalgias.  Skin: Negative for itching and rash.  Neurological: Positive for  dizziness, sensory change, focal weakness, weakness and headaches.  Psychiatric/Behavioral: The patient has insomnia (due to pain/nausea).       Past Medical History:  Diagnosis Date  . Asthma   . Atrial fibrillation ([place])   . Emphysema/COPD ([place])   . Lung cancer [facility])    Past Surgical History:  Procedure Laterality Date  . ABDOMINAL HYSTERECTOMY    . APPLICATION OF CRANIAL NAVIGATION Left 12/30/2016   Procedure: APPLICATION OF CRANIAL NAVIGATION;  Surgeon: Ashok Pall, MD;  Location: [facility];  Service: Neurosurgery;  Laterality: Left;  APPLICATION OF CRANIAL NAVIGATION  . CESAREAN SECTION    . CRANIOTOMY Left 12/30/2016   Procedure: CRANIOTOMY TUMOR EXCISION;  Surgeon: Ashok Pall, MD;  Location: [facility];  Service: Neurosurgery;  Laterality: Left;  CRANIOTOMY TUMOR EXCISION  . HERNIA REPAIR    . LUNG REMOVAL, PARTIAL  04/1997    Family History  Problem Relation Age of Onset  . Liver cancer Other   . Heart disease Other   . Brain cancer Neg Hx     Social History:  Married. Has needed assistance since June with ADLs and has been furniture walking due to RLE weakness and instability. Has supportive family.  She  reports that she quit smoking about 38 years ago. Her smoking use included Cigarettes. She has never used smokeless tobacco. She reports that she does not drink alcohol or use drugs.   Allergies  Allergen Reactions  . Aspirin Shortness Of Breath  . Atenolol Swelling    Throat swelled and closed up  . Boniva [Ibandronic Acid] Other (See Comments)    Pt does not remember  specific reaction  . Fluticasone Anaphylaxis and Hives  . Fosamax [Alendronate Sodium] Other (See Comments)    Pt does not remember specific reaction  . Latex Swelling and Rash    Throat swelled and closed up  . Mucinex [Guaifenesin Er] Anaphylaxis  . Rocephin [Ceftriaxone Sodium In Dextrose] Anaphylaxis and Rash  . Rotateq [Rotavirus Vaccine Live Oral] Other (See Comments)    "poorly tolerated"    . Sulfa Antibiotics Other (See Comments)    Pt does not remember specific reaction  . Vancomycin Other (See Comments)    Pt does not remember specific reaction  . Penicillins Rash    Childhood reaction Has patient had a PCN reaction causing immediate rash, facial/tongue/throat swelling, SOB or lightheadedness with hypotension: Yes Has patient had a PCN reaction causing severe rash involving mucus membranes or skin necrosis: No Has patient had a PCN reaction that required hospitalization: Unknown Has patient had a PCN reaction occurring within the last 10 years: No If all of the above answers are "NO", then may proceed with Cephalosporin use.  . Tobramycin Sulfate Rash    Medications Prior to Admission  Medication Sig Dispense Refill  . acetaminophen (TYLENOL) 500 MG tablet Take 1,000 mg by mouth every 6 (six) hours as needed for headache (pain).    Marland Kitchen albuterol (PROAIR HFA) 108 (90 Base) MCG/ACT inhaler Inhale 1-2 puffs into the lungs every 4 (four) hours as needed for wheezing or shortness of breath.    . Calcium Carbonate-Vitamin D (CALCIUM-D PO) Take 1 tablet by mouth at bedtime.    . ciprofloxacin (CIPRO) 500 MG tablet Take 500 mg by mouth See admin instructions. Take 1 tablet (500 mg) by mouth twice daily for 10 days as needed for infection    . clopidogrel (PLAVIX) 75 MG tablet Take 75 mg by mouth at bedtime.     Marland Kitchen diltiazem (CARDIZEM LA) 240 MG 24 hr tablet Take 1 tablet (240 mg total) by mouth daily. (Patient taking differently: Take 240 mg by mouth at bedtime. ) 30 tablet 0  . EPINEPHrine (EPIPEN) 0.3 mg/0.3 mL SOAJ injection Inject 0.3 mg into the muscle once as needed (severe allergic reaction).     . furosemide (LASIX) 20 MG tablet Take 20 mg by mouth daily as needed for fluid or edema.    Marland Kitchen HYDROcodone-homatropine (HYCODAN) 5-1.5 MG/5ML syrup Take 5 mLs by mouth at bedtime as needed for cough.    Marland Kitchen ipratropium-albuterol (DUONEB) 0.5-2.5 (3) MG/3ML SOLN Take 3 mLs by  nebulization 3 (three) times daily.     . Multiple Vitamin (MULTIVITAMIN WITH MINERALS) TABS tablet Take 1 tablet by mouth daily.    Marland Kitchen omeprazole (PRILOSEC) 20 MG capsule Take 20 mg by mouth 2 (two) times daily.    . OXYGEN Inhale 2 L into the lungs at bedtime.    . predniSONE (DELTASONE) 10 MG tablet Take 10 mg by mouth every other day.    Marland Kitchen PRESCRIPTION MEDICATION Inject into the muscle See admin instructions. 2 allergy shots weekly by Dr. Fransico Michael, [place], [place]    . salmeterol (SEREVENT DISKUS) 50 MCG/DOSE diskus inhaler Inhale 1 puff into the lungs 2 (two) times daily as needed (shortness of breath).     . Vitamin D, Ergocalciferol, (DRISDOL) 50000 UNITS CAPS capsule Take 50,000 Units by mouth See admin instructions. Take 1 capsule (50,000 units) by mouth every other Saturday    . [DISCONTINUED] Triamcinolone Acetonide (AZMACORT IN) Inhale 2 puffs into the lungs 4 (four) times daily as  needed (shortness of breath).      Drug Regimen Review  Drug regimen was reviewed and remains appropriate with no significant issues identified  Home: Home Living Family/patient expects to be discharged to:: Private residence Living Arrangements: Spouse/significant other Available Help at Discharge: Family, Available 24 hours/day Type of Home: House Home Access: Stairs to enter [employer] of Steps: 1 Entrance Stairs-Rails: None Home Layout: One level Bathroom Shower/Tub: Chiropodist: Handicapped height Bathroom Accessibility: Yes Home Equipment: Wheelchair - manual, Environmental consultant - 2 wheels, Cane - single point, Tub bench (items belonged to mother & sister (deceased))   Functional History: Prior Function Level of Independence: Independent with assistive device(s) Comments: Per pt's family, she was independent with ADLs and IADLs several months ago, and has progressively gotten worse. Pt stated in June she was mowing lawns  Functional Status:  Mobility: Bed  Mobility Overal bed mobility: Needs Assistance Bed Mobility: Sit to Supine Sit to supine: Mod assist, +2 for physical assistance General bed mobility comments: increased time and effort, assist with trunk guidance and bilateral LEs to return to supine Transfers Overall transfer level: Needs assistance Equipment used: Rolling walker (2 wheeled) Transfers: Sit to/from Stand, Stand Pivot Transfers Sit to Stand: Min assist Stand pivot transfers: Mod assist, +2 physical assistance, +2 safety/equipment General transfer comment: increased time and effort, cueing for hand placement, min A to rise from chair x1 and from [facility] x1, mod A x2 for safety with pivotal movement to [facility] Ambulation/Gait Ambulation/Gait assistance: Mod assist, +2 physical assistance Ambulation Distance (Feet): 10 Feet Assistive device: Rolling walker (2 wheeled) Gait Pattern/deviations: Step-to pattern, Decreased stance time - right, Decreased step length - right, Decreased step length - left, Decreased dorsiflexion - right, Decreased dorsiflexion - left, Decreased weight shift to right, Trunk flexed General Gait Details: attempted ambulation this session, however, pt very lethargic and difficulty advancing L LE to move forwards Gait velocity: decreased Gait velocity interpretation: <1.8 ft/sec, indicative of risk for recurrent falls    ADL: ADL Overall ADL's : Needs assistance/impaired Eating/Feeding: Minimal assistance, Sitting Eating/Feeding Details (indicate cue type and reason): Pt able to self feed with L hand with min A to open lids and containers Grooming: Oral care, Moderate assistance, Standing Grooming Details (indicate cue type and reason): Mod A +2 to maintain balance at sink. Mod A to use R hand while performing oral care. Mod A to support RUE at elbow and wrist; assist to bring hand back and forth when brushing teeth instead of moving head.  Upper Body Bathing: Moderate assistance, Sitting Lower Body Bathing:  Maximal assistance, Sit to/from stand Upper Body Dressing : Moderate assistance, Sitting Lower Body Dressing: Maximal assistance, Sit to/from stand Functional mobility during ADLs: Minimal assistance, +2 for safety/equipment, Rolling walker, Cueing for sequencing, Cueing for safety General ADL Comments: Pt demonstrating decreased functional performance. RUE with decreased grasp strength, coorination, ROM, and fucntional use. Pt with decreased standing balance  Cognition: Cognition Overall Cognitive Status: Impaired/Different from baseline Orientation Level: Oriented X4 Cognition Arousal/Alertness: Lethargic Behavior During Therapy: Flat affect Overall Cognitive Status: Impaired/Different from baseline Area of Impairment: Problem solving, Following commands, Attention, Awareness, Safety/judgement Current Attention Level: Sustained Following Commands: Follows one step commands consistently, Follows one step commands with increased time Safety/Judgement: Decreased awareness of safety Awareness: Intellectual Problem Solving: Slow processing, Requires verbal cues, Requires tactile cues General Comments: Pt requiring tactile and verbal cues for grooming at sink and safe transfer technqiues.   Blood pressure (!) 126/48, pulse (!) 59,  temperature 98.2 F (36.8 C), temperature source Oral, resp. rate 16, height 5\' 6"  (1.676 m), weight 86.2 kg (190 lb 1.6 oz), SpO2 91 %. Physical Exam  Nursing note and vitals reviewed. Constitutional: She is oriented to person, place, and time. She appears well-developed and well-nourished. No distress.  HENT:  Head: Normocephalic and atraumatic.  Eyes: Pupils are equal, round, and reactive to light. Conjunctivae are normal. No scleral icterus.  Neck: Normal range of motion. Neck supple.  Cardiovascular: Normal rate and regular rhythm.   No murmur heard. Respiratory: Effort normal and breath sounds normal. No stridor. No respiratory distress. She has no  wheezes.  GI: Soft. Bowel sounds are normal. She exhibits no distension. There is no tenderness.  Musculoskeletal: She exhibits edema. She exhibits no tenderness.  Neurological: She is alert and oriented to person, place, and time.  Speech clear. Light sensitivity decreased and now able to keep eyes open without any problems.  Able to follow simple motor command without difficulty. Right hemiparesis with sensory deficits. RUE 2/5 deltoid, biceps, triceps, wrist, 1+ HI. RLE: 1+/5 HF 1+ KE, tr to 0/5 ADF/PF. emerging tone RLE. Sensation 1/2 Right arm/leg. Can sense deep pain stim.   Skin: Skin is warm and dry. She is not diaphoretic.  Psychiatric: She has a normal mood and affect. Her behavior is normal. Thought content normal.    No results found for this or any previous visit (from the past 48 hour(s)). No results found.     Medical Problem List and Plan: 1.  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  DVT Prophylaxis/Anticoagulation: Mechanical: Sequential compression devices, below knee Bilateral lower extremities Will check dopplers with dense RLE weakness 3. Headaches/Pain Management: Headaches persistent and being managed with prn hydocodone.  4. Mood: LCSW to follow for evaluation and support.  5. Neuropsych: This patient is capable of making decisions on her own behalf. 6. Skin/Wound Care: routine pressure relief measures. Advance diet to soft--GI issues could be due to steroids and poor intake of liquid diet.  7. Fluids/Electrolytes/Nutrition: Monitor I/O. Check lytes in am--noted to be dehydrated on last check. .  8.  Nausea: Likely central. Continue full liquid diet. Zofran not effective--continue promethizine prn.  9. H/o A Fib: Managed with diltiazem. Monitor HR bid.  10. Emphysema/Bronchiectasis: continue nebulizers. Will resume chest PT bid as at home. Continue oxygen at nights and prn during the  day.  11. Leucocytosis: Monitor for signs of infection. Question steroid effect. Check Ua/UCs   Post Admission Physician Evaluation: 1. Functional deficits secondary  to metastatic lung ca. 2. Patient is admitted to receive collaborative, interdisciplinary care between the physiatrist, rehab nursing staff, and therapy team. 3. Patient's level of medical complexity and substantial therapy needs in context of that medical necessity cannot be provided at a lesser intensity of care such as a SNF. 4. Patient has experienced substantial functional loss from his/her baseline which was documented above under the "Functional History" and "Functional Status" headings.  Judging by the patient's diagnosis, physical exam, and functional history, the patient has potential for functional progress which will result in measurable gains while on inpatient rehab.  These gains will be of substantial and practical use upon discharge  in facilitating mobility and self-care at the household level. 5. Physiatrist will provide 24 hour management of medical needs as well as oversight of the therapy plan/treatment and provide guidance as appropriate regarding the interaction of the  two. 6. The Preadmission Screening has been reviewed and patient status is unchanged unless otherwise stated above. 7. 24 hour rehab nursing will assist with bladder management, bowel management, safety, skin/wound care, disease management, medication administration, pain management and patient education  and help integrate therapy concepts, techniques,education, etc. 8. PT will assess and treat for/with: Lower extremity strength, range of motion, stamina, balance, functional mobility, safety, adaptive techniques and equipment, NMR, family education w/c assessment.   Goals are: supervision. 9. OT will assess and treat for/with: ADL's, functional mobility, safety, upper extremity strength, adaptive techniques and equipment, NMR, family education ego  support.   Goals are: supervision. Therapy may proceed with showering this patient. 10. SLP will assess and treat for/with: cognition, communication.  Goals are: supervision to mod I. 11. Case Management and Social Worker will assess and treat for psychological issues and discharge planning. 12. Team conference will be held weekly to assess progress toward goals and to determine barriers to discharge. 13. Patient will receive at least 3 hours of therapy per day at least 5 days per week. 14. ELOS: 14-17 days       15. Prognosis:  good     Meredith Staggers, MD, [place] [facility] 01/07/2017  Bary Leriche, Hershal Coria 01/07/2017

## 2017-01-07 NOTE — Care Management Note (Signed)
Case Management Note  Patient Details  Name: SAVAYAH WALTRIP MRN: [id number] Date of Birth: [birth year]  Subjective/Objective:                    Action/Plan: CIR has approval for inpatient rehab. CM spoke to Dr Christella Noa and he is in agreement with patient discharging to CIR today. Genie with CIR notified. No further needs per CM.  Expected Discharge Date:                  Expected Discharge Plan:  Home/Self Care  In-House Referral:     Discharge planning Services  CM Consult  Post Acute Care Choice:    Choice offered to:     DME Arranged:    DME Agency:     HH Arranged:    [place] Agency:     Status of Service:  Completed, signed off  If discussed at [employer] of Stay Meetings, dates discussed:    Additional Comments:  Pollie Friar, RN 01/07/2017, 2:14 PM

## 2017-01-07 NOTE — Discharge Summary (Signed)
Physician Discharge Summary  Patient ID: Tracy Bridges MRN: [id number] DOB/AGE: Sep 07, 1940 76 y.o.  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 She was admitted by the medicine team and I consulted. Post op she remains quite weak in the right upper extremity. She is tolerating a regular diet,her wound is clean, dry, and without signs of infection. Post op CT of the chest, abdomen, and pelvis revealed no primary tumor. The pathologist favored the tumor to be of lung origin. She has been seen by oncology and a radiation oncology consult was requested. At discharge she is alert, oriented x 4, has had some nausea. Post op MRI showed residual tumor anteriorly. Tracy Bridges will be discharged to the Rehabilitation service.   Treatments: surgery: left frontal stereotactic craniotomy for tumor resection  Discharge Exam: Blood pressure (!) 118/52, pulse 61, temperature 98.3 F (36.8 C), temperature source Oral, resp. rate 19, height 5\' 6"  (1.676 m), weight 86.2 kg (190 lb 1.6 oz), SpO2 97 %. General appearance: alert, cooperative, appears stated age, fatigued and mild distress Neurologic: Mental status: Alert, oriented, thought content appropriate Motor: normal on the left, weak in the right upper extremity, 2/5. mild weakness in right lower extremity 4/5  Disposition: 01-Home or Self Care BRAIN TUMOR     Signed: Maira Christon  L 01/07/2017, 4:54 PM

## 2017-01-08 ENCOUNTER — Inpatient Hospital Stay (HOSPITAL_COMMUNITY): Payer: Medicare HMO | Admitting: Physical Therapy

## 2017-01-08 ENCOUNTER — Inpatient Hospital Stay (HOSPITAL_COMMUNITY): Payer: Medicare HMO | Admitting: Occupational Therapy

## 2017-01-08 DIAGNOSIS — J438 Other emphysema: Secondary | ICD-10-CM

## 2017-01-08 DIAGNOSIS — R531 Weakness: Secondary | ICD-10-CM

## 2017-01-08 DIAGNOSIS — I480 Paroxysmal atrial fibrillation: Secondary | ICD-10-CM

## 2017-01-08 DIAGNOSIS — C7931 Secondary malignant neoplasm of brain: Secondary | ICD-10-CM

## 2017-01-08 LAB — COMPREHENSIVE METABOLIC PANEL
ALT: 21 U/L (ref 14–54)
AST: 24 U/L (ref 15–41)
Albumin: 3.2 g/dL — ABNORMAL LOW (ref 3.5–5.0)
Alkaline Phosphatase: 49 U/L (ref 38–126)
Anion gap: 11 (ref 5–15)
BILIRUBIN TOTAL: 0.8 mg/dL (ref 0.3–1.2)
BUN: 34 mg/dL — AB (ref 6–20)
CO2: 21 mmol/L — ABNORMAL LOW (ref 22–32)
CREATININE: 0.97 mg/dL (ref 0.44–1.00)
Calcium: 9.3 mg/dL (ref 8.9–10.3)
Chloride: 104 mmol/L (ref 101–111)
GFR calc Af Amer: >60 mL/min (ref >60)
GFR, EST NON AFRICAN AMERICAN: 55 mL/min — AB (ref >60)
Glucose, Bld: 122 mg/dL — ABNORMAL HIGH (ref 65–99)
Potassium: 4.7 mmol/L (ref 3.5–5.1)
Sodium: 136 mmol/L (ref 135–145)
TOTAL PROTEIN: 5.7 g/dL — AB (ref 6.5–8.1)

## 2017-01-08 LAB — CBC WITH DIFFERENTIAL/PLATELET
BASOS PCT: 0 %
Basophils Absolute: 0 10*3/uL (ref 0.0–0.1)
EOS PCT: 0 %
Eosinophils Absolute: 0 10*3/uL (ref 0.0–0.7)
HEMATOCRIT: 37.2 % (ref 36.0–46.0)
Hemoglobin: 12.2 g/dL (ref 12.0–15.0)
LYMPHS ABS: 0.9 10*3/uL (ref 0.7–4.0)
Lymphocytes Relative: 4 %
MCH: 29.9 pg (ref 26.0–34.0)
MCHC: 32.8 g/dL (ref 30.0–36.0)
MCV: 91.2 fL (ref 78.0–100.0)
MONOS PCT: 3 %
Monocytes Absolute: 0.7 10*3/uL (ref 0.1–1.0)
Neutro Abs: 20.5 10*3/uL — ABNORMAL HIGH (ref 1.7–7.7)
Neutrophils Relative %: 93 %
Platelets: 111 10*3/uL — ABNORMAL LOW (ref 150–400)
RBC: 4.08 MIL/uL (ref 3.87–5.11)
RDW: 14.3 % (ref 11.5–15.5)
WBC: 22.1 10*3/uL — AB (ref 4.0–10.5)

## 2017-01-08 LAB — URINALYSIS, COMPLETE (UACMP) WITH MICROSCOPIC
Bacteria, UA: NONE SEEN
Bilirubin Urine: NEGATIVE
GLUCOSE, UA: NEGATIVE mg/dL
HGB URINE DIPSTICK: NEGATIVE
Ketones, ur: NEGATIVE mg/dL
Leukocytes, UA: NEGATIVE
NITRITE: NEGATIVE
PH: 7 (ref 5.0–8.0)
PROTEIN: NEGATIVE mg/dL
Specific Gravity, Urine: 1.021 (ref 1.005–1.030)
Squamous Epithelial / LPF: NONE SEEN

## 2017-01-08 MED ORDER — SODIUM CHLORIDE 0.9% FLUSH
10.0000 mL | INTRAVENOUS | Status: DC | PRN
  Administered 2017-01-08: 10 mL

## 2017-01-08 NOTE — Progress Notes (Signed)
Patient takes CPT vest txs at home.  Patient not ready for CPT wants to wait until in bed.  RN will call RT when patient ready for CPT.

## 2017-01-08 NOTE — Progress Notes (Signed)
RT NOTE:  Pt ordered to start CPT vest therapy. No CPT vest are available this evening. RT will attempt to locate one before morning. If none available RT will give patient Flutter valve to use until Vest available. Pt aware and OK with this. RT will monitor.

## 2017-01-08 NOTE — Evaluation (Signed)
Occupational Therapy Assessment and Plan  Patient Details  Name: Tracy Bridges MRN: [id number] Date of Birth: 24-Aug-1940  OT Diagnosis: disturbance of vision, hemiplegia affecting dominant side, muscle weakness (generalized) and coordination disorder Rehab Potential: Rehab Potential (ACUTE ONLY): Excellent ELOS: 12--14 days   Today's Date: 01/08/2017 OT Individual Time: 5701-7793 and 9030-0923 OT Individual Time Calculation (min): 73 min and 40 min   Problem List:  Patient Active Problem List   Diagnosis Date Noted  . Metastatic cancer to brain ([place]) 01/07/2017  . Brain tumor ([place]) 12/30/2016  . Focal seizure ([place])   . Atrial fibrillation ([place]) 12/26/2016  . Emphysema/COPD ([place]) 12/26/2016  . Brain mass 12/26/2016  . Paresthesia of right arm 12/26/2016  . 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 Right sided weakness   . History of lung cancer   . GERD (gastroesophageal reflux disease) 12/03/2014    Past Medical History:  Past Medical History:  Diagnosis Date  . Asthma   . Atrial fibrillation ([place])   . Emphysema/COPD ([place])   . Lung cancer [facility])    Past Surgical History:  Past Surgical History:  Procedure Laterality Date  . ABDOMINAL HYSTERECTOMY    . APPLICATION OF CRANIAL NAVIGATION Left 12/30/2016   Procedure: APPLICATION OF CRANIAL NAVIGATION;  Surgeon: Ashok Pall, MD;  Location: [place];  Service: Neurosurgery;  Laterality: Left;  APPLICATION OF CRANIAL NAVIGATION  . CESAREAN SECTION    . CRANIOTOMY Left 12/30/2016   Procedure: CRANIOTOMY TUMOR EXCISION;  Surgeon: Ashok Pall, MD;  Location: [facility];  Service: Neurosurgery;  Laterality: Left;  CRANIOTOMY TUMOR EXCISION  . HERNIA REPAIR    . LUNG REMOVAL, PARTIAL  04/1997    Assessment & Plan Clinical Impression: Tracy S Jonesis a 76 y.o.femalewith history of A fib, emphysema, bronchiectasis?--uses percussion vest and nebs tid, lung cancer who was admitted on 12/26/16 with abnormalRUE motor activity.  Patient with 6 week history of progressive right sided weakness with foot drop, headaches and nausea but unable to tolerate MRI for work up. Head CT done revealing She was as well as large ovoid mass in high left frontal lobe with vasogenic edema. 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 She was started on Keppra as well as steroids. Eliquis placed on hold and patient transitioned to IV heparin. CT abdomen/pelvis done showing no evidence of metastatic disease and incidental findings of left thyroid nodule and moderate size HH. She underwent craniotomy for tumor resection on 12/31/16 by Dr. Christella Noa. Pathology revealed metastatic lung cancerand Dr. Alen Blew consulted for input. He reheme/onc input pending. Patient with resultant right hemiplegia, lethargy, as well as deficits in processing and following commands. CIR recommended due to deficits in mobility and ability to carry out ADL tasks.   Patient currently requires mod-2 helpers with basic self-care skills secondary to muscle weakness, decreased cardiorespiratoy endurance, impaired timing and sequencing, unbalanced muscle activation and and decreased coordination.  Prior to hospitalization, patient could complete BADLs with modified independent .  Patient will benefit from skilled intervention to increase independence with basic self-care skills prior to discharge home with spouse.  Anticipate patient will require 24 hour supervision and follow up home health and follow up outpatient.  OT - End of Session Endurance Deficit: Yes OT Assessment Rehab Potential (ACUTE ONLY): Excellent OT Barriers to Discharge: Home environment access/layout OT Barriers to Discharge Comments:  (bathroom accessibility) OT Patient demonstrates  impairments in the following area(s): Balance;Safety;Sensory;Endurance;Motor;Vision OT Basic ADL's Functional  Problem(s): Grooming;Bathing;Dressing;Toileting;Eating OT Advanced ADL's Functional Problem(s): Simple Meal Preparation OT Transfers Functional Problem(s): Toilet;Tub/Shower OT Additional Impairment(s): Fuctional Use of Upper Extremity OT Plan OT Intensity: Minimum of 1-2 x/day, 45 to 90 minutes OT Frequency: 5 out of 7 days OT Duration/Estimated Length of Stay: 12--14 days OT Treatment/Interventions: Balance/vestibular training;Discharge planning;Pain management;Self Care/advanced ADL retraining;Therapeutic Activities;UE/LE Coordination activities;Disease mangement/prevention;Functional mobility training;Patient/family education;Therapeutic Exercise;Visual/perceptual remediation/compensation;Community reintegration;DME/adaptive equipment instruction;Neuromuscular re-education;Psychosocial support;UE/LE Strength taining/ROM;Wheelchair propulsion/positioning OT Self Feeding Anticipated Outcome(s): Supervision/setup OT Basic Self-Care Anticipated Outcome(s): Supervision/setup OT Toileting Anticipated Outcome(s): Supervision/setup OT Bathroom Transfers Anticipated Outcome(s): Supervision/setup OT Recommendation Recommendations for Other Services: Vestibular eval;Neuropsych consult;Therapeutic Recreation consult Therapeutic Recreation Interventions: Pet therapy;Stress management;Kitchen group Patient destination: Home Follow Up Recommendations: Home health OT;Outpatient OT Equipment Recommended: Tub/shower seat;To be determined   Skilled Therapeutic Intervention Skilled OT session completed with focus on initial evaluation, pt/daughter education, education on OT role/POC, and establishment of patient-centered goals.   Pt greeted supine in bed with daughter present. No c/o pain. Pt transitioning to EOB with Mod A, and completed stand pivot transfer to Rt side with Mod A and Rt knee buckling. Able to hold onto armrest with R UE. She then transferred to TTB with Mod A and use of grab bars. PICC line  covered. Shower cap worn during bathing (per RN request). Pt able to use R UE to wash Lt arm and sequence/self organize appropriately. Prior to standing for pericare, pt breathing heavily and reported she was unable to stand up. Pericare completed via lateral leans, 02 sats 96-97% on RA. Squat pivot completed back to w/c with extra time due to increased feelings of anxiousness. Pt adamant that she must be completely dry after the shower before donning clothes. 2 helpers for thorough drying and for sit<stand at grab bars beside toilet with Rt knee supported. Max cues for scooting forward and anterior weight shifting prior to stand. Pt preferring to wear new hospital gown due to fatigue. She reported needing to void. 2 helpers for stand pivot<BSC over toilet. 2 helpers for hygiene completion and transfer back to w/c. Afterwards, she was escorted back into room. Her daughter then reported pt has claustrophobia, and increased anxiousness was due to the bathroom door being shut. Safety plan modified to make CIR staff aware of this. Pt was left with daughter and RN at time of departure.   2nd Session 1:1 tx (40 min) Tx focus on d/c planning and family education.   Pt greeted in recliner with spouse present. Educated them on ELOS and established OT goals. Discussed bathroom DME and bathroom accessibility at d/c. Neither bathroom is walker or w/c accessible. She wants an alternative option to using the tub shower due to increased claustrophobia while seated on her TTB at home. Will possibly use walk in shower or walk in tub shower if spouse will install it. Also discussed pts c/o that she feels like "the room is moving" during transfers/at rest. Recommended vestibular eval to further assess. RN made aware. Provided pt with 2 HEPs for room use for R UE strength/coordination remediation. At end of tx pt was left with spouse and RN.   OT Evaluation Precautions/Restrictions  Precautions Precautions: Other  (comment) Precaution Comments: claustrophobia Restrictions Weight Bearing Restrictions: No General Chart Reviewed: Yes Family/Caregiver Present: Yes (spouse + daughter) Vital Signs Therapy Vitals Temp: 98.6 F (37 C) Temp Source: Oral Pulse Rate: 67 Resp: 18 BP: (!) 124/42 (RN notified) Patient Position (if appropriate):  Sitting Oxygen Therapy SpO2: 100 % O2 Device: Not Delivered Pain No c/o pain during tx    Home Living/Prior Functioning Home Living Available Help at Discharge: Family, Available 24 hours/day Type of Home: House Home Layout: One level Bathroom Shower/Tub: Tub/shower unit, Walk-in shower (with TTB) Bathroom Toilet: Standard Bathroom Accessibility: No Additional Comments: She is opting to use walk in shower at d/c (or have spouse install large walk in tub). Per pt, being in the tub exacerbates her claustrophobia   Lives With: Spouse IADL History Homemaking Responsibilities: Yes Meal Prep Responsibility: Primary Laundry Responsibility: Primary Cleaning Responsibility: Primary Bill Paying/Finance Responsibility: Primary Shopping Responsibility: Primary Homemaking Comments: Very active. Managing the home + her home church  Occupation: Retired Type of Occupation: Worked for [employer] and [place]: [employer], [employer], knitting/crocheting, painting, yard work/growing flowers  IADL Comments: Per husband, she "works 12 hour days" Prior Function Level of Independence: Independent with basic ADLs, Independent with homemaking with ambulation (before last June)  Able to Take Stairs?: Yes Driving: No Leisure: Hobbies-yes (Comment) Comments: Pt was indep as of June, slow progression to require assistance w/ ADLs, IADLs, and gait (Min guard to HHA/supervision). Enjoys gardening and tending to flower bed.  ADL ADL ADL Comments: Please see functional navigator for ADL status Vision Baseline Vision/History: Wears glasses Wears  Glasses: Reading only Vision Assessment?: Yes Additional Comments: Able to read names of nursing staff on wall mounted board, accurately describe details/colors of portrait in room. However, she reports the room seems like it's moving. Perception  Perception: Within Functional Limits Praxis Praxis: Intact Cognition Overall Cognitive Status: Within Functional Limits for tasks assessed Arousal/Alertness: Awake/alert Orientation Level: Person;Place;Situation Person: Oriented Place: Oriented Situation: Oriented Year: 2018 Month: October Day of Week: Correct Memory: Appears intact Immediate Memory Recall: Sock;Blue;Bed Memory Recall: Sock;Blue;Bed Memory Recall Sock: Without Cue Memory Recall Blue: Without Cue Memory Recall Bed: Without Cue Attention: Sustained Sustained Attention: Appears intact Awareness: Appears intact Problem Solving: Appears intact Safety/Judgment: Appears intact Sensation Sensation Light Touch: Impaired by gross assessment (Impaired R UE/LE. Per pt "feels like they're half dead") Stereognosis: Not tested Hot/Cold: Appears Intact Proprioception: Appears Intact Coordination Gross Motor Movements are Fluid and Coordinated: No Fine Motor Movements are Fluid and Coordinated: No Coordination and Movement Description: Affected by hemiparesis + sensation changes Motor  Motor Motor: Hemiplegia Motor - Skilled Clinical Observations: R hemiplegia, generalized weakness + endurance deficits Mobility  Transfers Transfers: Sit to Stand;Stand to Sit Sit to Stand: 3: Mod assist;From toilet Stand to Sit: 3: Mod assist;To toilet  Trunk/Postural Assessment  Cervical Assessment Cervical Assessment: Within Functional Limits Thoracic Assessment Thoracic Assessment: Exceptions to [facility] (kyphotic) Lumbar Assessment Lumbar Assessment: Within Functional Limits Postural Control Postural Control: Deficits on evaluation  Balance Balance Balance Assessed: Yes Dynamic  Sitting Balance Dynamic Sitting - Level of Assistance: 4: Min assist (for LB bathing; Mod A dynamic standing during shower and toilet transfers) Extremity/Trunk Assessment RUE Assessment RUE Assessment: Exceptions to [facility] (2+/5 MMT, FMC/sensation deficits ) LUE Assessment LUE Assessment: Within Functional Limits   See Function Navigator for Current Functional Status.   Refer to Care Plan for Long Term Goals  Recommendations for other services: Neuropsych, Therapeutic Recreation  Pet therapy, Kitchen group and Outing/community reintegration and Other: vestibular consult   Discharge Criteria: Patient will be discharged from OT if patient refuses treatment 3 consecutive times without medical reason, if treatment goals not met, if there is a change in medical status, if patient makes no progress towards  goals or if patient is discharged from hospital.  The above assessment, treatment plan, treatment alternatives and goals were discussed and mutually agreed upon: by patient and by family  Skeet Simmer 01/08/2017, 5:12 PM

## 2017-01-08 NOTE — Progress Notes (Signed)
Physical Therapy Assessment and Plan  Patient Details  Name: Tracy Bridges MRN: [id number] Date of Birth: 1941/03/07  PT Diagnosis: Coordination disorder, Difficulty walking, Hemiplegia dominant, Impaired sensation and Muscle weakness Rehab Potential: Good ELOS: 12-14 days   Today's Date: 01/08/2017 PT Individual Time:0925-0950 AND 1300-1359 PT Individual Time Calculation (min): 25 min AND 59 min    Problem List:  Patient Active Problem List   Diagnosis Date Noted  . Metastatic cancer to brain ([place]) 01/07/2017  . Brain tumor ([place]) 12/30/2016  . Focal seizure ([place])   . Atrial fibrillation ([place]) 12/26/2016  . Emphysema/COPD ([place]) 12/26/2016  . Brain mass 12/26/2016  . Paresthesia of right arm 12/26/2016  . 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 Right sided weakness   . History of lung cancer   . GERD (gastroesophageal reflux disease) 12/03/2014    Past Medical History:  Past Medical History:  Diagnosis Date  . Asthma   . Atrial fibrillation ([place])   . Emphysema/COPD ([place])   . Lung cancer [facility])    Past Surgical History:  Past Surgical History:  Procedure Laterality Date  . ABDOMINAL HYSTERECTOMY    . APPLICATION OF CRANIAL NAVIGATION Left 12/30/2016   Procedure: APPLICATION OF CRANIAL NAVIGATION;  Surgeon: Ashok Pall, MD;  Location: [place];  Service: Neurosurgery;  Laterality: Left;  APPLICATION OF CRANIAL NAVIGATION  . CESAREAN SECTION    . CRANIOTOMY Left 12/30/2016   Procedure: CRANIOTOMY TUMOR EXCISION;  Surgeon: Ashok Pall, MD;  Location: [place];  Service: Neurosurgery;  Laterality: Left;  CRANIOTOMY TUMOR EXCISION  . HERNIA REPAIR    . LUNG REMOVAL, PARTIAL  04/1997    Assessment & Plan Clinical Impression: Patient is a 76 y.o.femalewith history of A fib, emphysema, bronchiectasis?--uses percussion vest and nebs tid, lung cancer who was admitted on 12/26/16 with abnormalRUE motor activity. Patient with 6 week history of progressive right sided  weakness with foot drop, headaches and nausea but unable to tolerate MRI for work up. Head CT done revealing She was as well as large ovoid mass in high left frontal lobe with vasogenic edema. 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 She was started on Keppra as well as steroids. Eliquis placed on hold and patient transitioned to IV heparin. CT abdomen/pelvis done showing no evidence of metastatic disease and incidental findings of left thyroid nodule and moderate size HH. She underwent craniotomy for tumor resection on 12/31/16 by Dr. Christella Noa. Pathology revealed metastatic lung cancerand Dr. Alen Blew consulted for input. He reheme/onc input pending. Patient with resultant right hemiplegia, lethargy, as well as deficits in processing and following commands. CIR recommended due to deficits in mobility and ability to carry out ADL tasks. Patient transferred to CIR on 01/07/2017 .   Patient currently requires max with mobility secondary to muscle weakness, decreased cardiorespiratoy endurance, decreased coordination and decreased standing balance, decreased postural control, hemiplegia and decreased balance strategies.  Prior to hospitalization, patient was modified independent  with mobility and lived with Spouse in a House home.  Home access is 3 steps spaced outStairs to enter.  Patient will benefit from skilled PT intervention to maximize safe functional mobility, minimize fall risk and decrease caregiver burden for planned discharge home with 24 hour assist.  Anticipate patient will benefit from follow up OP at discharge.  PT - End of Session Activity Tolerance: Tolerates 10 - 20 min activity with  multiple rests Endurance Deficit: Yes Endurance Deficit Description: decreased PT Assessment Rehab Potential (ACUTE/IP ONLY): Good PT Barriers to Discharge: Inaccessible home environment;Home  environment access/layout PT Barriers to Discharge Comments: Pt reports husband able to provide Min A physically at home, has been providing that level of assistance for a few months. Son and daughter both live locally as well.  PT Patient demonstrates impairments in the following area(s): Balance;Perception;Endurance;Motor;Pain;Sensory;Safety PT Transfers Functional Problem(s): Bed Mobility;Bed to Chair;Car;Furniture;Floor PT Locomotion Functional Problem(s): Wheelchair Mobility;Ambulation;Stairs PT Plan PT Intensity: Minimum of 1-2 x/day ,45 to 90 minutes PT Frequency: 5 out of 7 days PT Duration Estimated Length of Stay: 12-14 days PT Treatment/Interventions: Ambulation/gait training;Disease management/prevention;Pain management;Stair training;Visual/perceptual remediation/compensation;Wheelchair propulsion/positioning;Therapeutic Activities;Patient/family education;DME/adaptive equipment instruction;Balance/vestibular training;Cognitive remediation/compensation;Functional electrical stimulation;Psychosocial support;Therapeutic Exercise;UE/LE Strength taining/ROM;Skin care/wound management;Functional mobility training;Community reintegration;Discharge planning;Neuromuscular re-education;Splinting/orthotics;UE/LE Coordination activities PT Transfers Anticipated Outcome(s): supervision PT Locomotion Anticipated Outcome(s): supervision PT Recommendation Follow Up Recommendations: Outpatient PT (OPPT if pt performs safe car transfer) Patient destination: Home Equipment Recommended: To be determined Equipment Details: already has rollator  Skilled Therapeutic Intervention  Session 1:  Pt supine upon arrival and agreeable to therapy, no c/o pain. Asked pt and daughter questions regarding PLOF, home set-up, and d/c plan. Worked on bed mobility this session and pt transferred to EOB and maintained static sitting balance w/ supervision for 5+ min. Returned to supine at end of session and ended session  in supine and in care of daughter, all needs met.   Session 2:  Pt sitting EOB upon arrival and agreeable to therapy, no c/o pain. Worked on w/c management, transfers, and gait this session. Pt had significant difficulty getting up from low w/c, switched w/c to a higher one w/ thicker cushion and elevating leg rests per pt's request. Transferred to new w/c and instructed pt on self-propelling w/c w/ UEs. Ambulated 15' in gym w/ Min A for balance using RW and w/c follow. Returned to room and transferred to recliner, ended session in recliner and in care of husband, all needs met. PT and husband instructed patient in PT Evaluation and initiated treatment intervention; see below for results. PT and husband educated patient in [place], rehab potential, rehab goals, and discharge recommendations.   PT Evaluation Precautions/Restrictions Precautions Precautions: None Restrictions Weight Bearing Restrictions: No General   Vital SignsTherapy Vitals Temp: 99.1 F (37.3 C) Temp Source: Oral Pulse Rate: 66 Resp: 18 BP: (!) 170/56 Patient Position (if appropriate): Lying Oxygen Therapy SpO2: 99 % O2 Device: Not Delivered Home Living/Prior Functioning Home Living Available Help at Discharge: Family;Available 24 hours/day Type of Home: House Home Access: Stairs to enter [employer] of Steps: 3 steps spaced out Entrance Stairs-Rails: None Home Layout: One level Bathroom Shower/Tub: Chiropodist: Handicapped height Bathroom Accessibility: Yes Additional Comments: pt had tub bench previously that did not work w/ tub at home  Lives With: Spouse Prior Function Level of Independence: Needs assistance with ADLs;Needs assistance with gait;Needs assistance with homemaking  Able to Take Stairs?: Yes Driving: No Leisure: Hobbies-yes (Comment) Comments: Pt was indep as of June, slow progression to require assistance w/ ADLs, IADLs, and gait (Min guard to HHA/supervision).  Enjoys gardening and tending to flower bed.  Vision/Perception  Perception Perception: Within Functional Limits Praxis Praxis: Intact  Cognition Arousal/Alertness: Awake/alert Orientation Level: Oriented X4 Attention: Sustained Sustained Attention: Appears intact Memory: Appears intact Awareness: Appears intact Problem Solving: Appears intact Safety/Judgment: Appears intact Sensation Sensation Light Touch: Impaired by gross assessment (impaired sensation RLE) Proprioception: Appears Intact  Coordination Gross Motor Movements are Fluid and Coordinated: No Fine Motor Movements are Fluid and Coordinated: No Motor  Motor Motor: Hemiplegia Motor - Skilled Clinical Observations: R hemiplegia   Mobility Bed Mobility Bed Mobility: Rolling Right;Rolling Left;Supine to Sit;Sit to Supine Rolling Right: 4: Min assist Rolling Right Details: Manual facilitation for placement;Manual facilitation for weight bearing;Verbal cues for technique;Manual facilitation for weight shifting;Tactile cues for placement Rolling Left: 5: Supervision Rolling Left Details: Verbal cues for technique Supine to Sit: 4: Min assist Supine to Sit Details: Manual facilitation for placement;Manual facilitation for weight bearing;Manual facilitation for weight shifting;Verbal cues for technique;Tactile cues for placement Sit to Supine: 3: Mod assist Sit to Supine - Details: Manual facilitation for placement;Manual facilitation for weight bearing;Manual facilitation for weight shifting Transfers Transfers: Yes Sit to Stand: 3: Mod assist Sit to Stand Details: Manual facilitation for weight shifting;Manual facilitation for weight bearing;Manual facilitation for placement;Tactile cues for initiation;Verbal cues for technique Stand to Sit: 3: Mod assist Stand to Sit Details (indicate cue type and reason): Manual facilitation for weight shifting;Manual facilitation for weight bearing;Manual facilitation for  placement;Verbal cues for technique;Verbal cues for sequencing;Tactile cues for initiation Stand Pivot Transfers: 3: Mod assist Stand Pivot Transfer Details: Manual facilitation for weight shifting;Manual facilitation for weight bearing;Manual facilitation for placement;Tactile cues for placement;Tactile cues for initiation;Verbal cues for technique Stand Pivot Transfer Details (indicate cue type and reason): Max A verbal and visual cues for technique, pt insisted on being directly in front of surface she was attempting to transfer to, took convincing to transfer via stand pivot to a surface that was 90 deg from her Locomotion  Ambulation Ambulation: Yes Ambulation/Gait Assistance: 4: Min assist Ambulation Distance (Feet): 15 Feet Assistive device: Rolling walker Ambulation/Gait Assistance Details: Verbal cues for gait pattern;Verbal cues for technique Ambulation/Gait Assistance Details: assistance for balance Gait Gait: Yes Gait Pattern: Impaired Gait Pattern: Step-to pattern;Trunk flexed;Antalgic Gait velocity: decreased Stairs / Additional Locomotion Stairs: No Architect: Yes Wheelchair Assistance: 4: Advertising account executive Details: Multimedia programmer for [employer] shifting Wheelchair Propulsion: Both upper extremities Wheelchair Parts Management: Needs assistance Distance: 69'  Trunk/Postural Assessment  Cervical Assessment Cervical Assessment: Within Functional Limits Thoracic Assessment Thoracic Assessment: Exceptions to [facility] (increased kyphosis) Lumbar Assessment Lumbar Assessment: Within Functional Limits Postural Control Postural Control: Within Functional Limits  Balance Balance Balance Assessed: Yes Static Sitting Balance Static Sitting - Balance Support: Bilateral upper extremity supported;Feet supported Static Sitting - Level of Assistance: 5: Stand by assistance Dynamic Sitting Balance Dynamic Sitting - Level of Assistance:  5: Stand by assistance (dressing) Static Standing Balance Static Standing - Balance Support: Bilateral upper extremity supported;During functional activity Static Standing - Level of Assistance: 4: Min assist Extremity Assessment  RLE Assessment RLE Assessment: Exceptions to [facility] (1 to 2+/5 globally) LLE Assessment LLE Assessment: Within Functional Limits   See Function Navigator for Current Functional Status.   Refer to Care Plan for Long Term Goals  Recommendations for other services: None   Discharge Criteria: Patient will be discharged from PT if patient refuses treatment 3 consecutive times without medical reason, if treatment goals not met, if there is a change in medical status, if patient makes no progress towards goals or if patient is discharged from hospital.  The above assessment, treatment plan, treatment alternatives and goals were discussed and mutually agreed upon: by patient and by family  Helayne Metsker K Arnette 01/08/2017, 9:59 AM

## 2017-01-08 NOTE — Progress Notes (Signed)
Tracy Bridges is a 76 y.o. female 12-19-1940 [id number]  Subjective: No new complaints. No new problems. Denies pain other than mild HA. Anxious to engage in therapy and "get home"  Objective: Vital signs in last 24 hours: Temp:  [98.2 F (36.8 C)-99.1 F (37.3 C)] 99.1 F (37.3 C) (10/06 0645) Pulse Rate:  [59-66] 66 (10/06 0659) Resp:  [18-19] 18 (10/06 0645) BP: (118-170)/(48-60) 170/56 (10/06 0659) SpO2:  [95 %-100 %] 99 % (10/06 0832) Weight change:  Last BM Date: 12/30/16  Intake/Output from previous day: 10/05 0701 - 10/06 0700 In: 10 [I.V.:10] Out: 700 [Urine:700]  Physical Exam General: No apparent distress   Taking neb tx during my visit; Dtr in room Lungs: Normal effort. Lungs clear to auscultation, no crackles or wheezes. Cardiovascular: irregular rate and rhythm, no edema Neurological: No new neurological deficits   Lab Results: BMET    Component Value Date/Time   NA 136 01/08/2017 0943   K 4.7 01/08/2017 0943   CL 104 01/08/2017 0943   CO2 21 (L) 01/08/2017 0943   GLUCOSE 122 (H) 01/08/2017 0943   BUN 34 (H) 01/08/2017 0943   CREATININE 0.97 01/08/2017 0943   CALCIUM 9.3 01/08/2017 0943   GFRNONAA 55 (L) 01/08/2017 0943   GFRAA >60 01/08/2017 0943   CBC    Component Value Date/Time   WBC 22.1 (H) 01/08/2017 0943   RBC 4.08 01/08/2017 0943   HGB 12.2 01/08/2017 0943   HCT 37.2 01/08/2017 0943   PLT 111 (L) 01/08/2017 0943   MCV 91.2 01/08/2017 0943   MCH 29.9 01/08/2017 0943   MCHC 32.8 01/08/2017 0943   RDW 14.3 01/08/2017 0943   LYMPHSABS 0.9 01/08/2017 0943   MONOABS 0.7 01/08/2017 0943   EOSABS 0.0 01/08/2017 0943   BASOSABS 0.0 01/08/2017 0943   CBG's (last 3):  No results for input(s): GLUCAP in the last 72 hours. LFT's Lab Results  Component Value Date   ALT 21 01/08/2017   AST 24 01/08/2017   ALKPHOS 49 01/08/2017   BILITOT 0.8 01/08/2017    Studies/Results: No results found.  Medications:  I have reviewed the patient's  current medications. Scheduled Medications: . arformoterol  15 mcg Nebulization BID  . budesonide (PULMICORT) nebulizer solution  0.5 mg Nebulization BID  . calcium-vitamin D  1 tablet Oral QHS  . dexamethasone  4 mg Oral Q8H  . diltiazem  240 mg Oral Daily  . ipratropium-albuterol  3 mL Nebulization TID  . levETIRAcetam  500 mg Oral BID  . meclizine  25 mg Oral TID  . multivitamin with minerals  1 tablet Oral Daily  . pantoprazole  40 mg Oral BID AC  . senna-docusate  2 tablet Oral QHS   PRN Medications: acetaminophen, albuterol, alum & mag hydroxide-simeth, bisacodyl, diphenhydrAMINE, HYDROcodone-acetaminophen, LORazepam, naLOXone (NARCAN)  injection, polyethylene glycol, promethazine **OR** promethazine **OR** promethazine, sodium chloride flush, sodium phosphate, traZODone  Assessment/Plan: Principal Problem:   Right sided weakness Active Problems:   Atrial fibrillation ([facility])   Emphysema/COPD ([facility])   Metastatic cancer to brain ([place])   1. 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 Continue CIR  2. Met lung ca - on Keppra for sz prophylaxis and steroids - OP f/u planned with neuro onc and med onc for further eval and determination of tx 3. A Fib - cont Dilt; off PTA Eliquis due to brain met and recent Nsurg 4. Emphysema/bronchiectasis, steroid dep PTA (monitor when converting off dexamethasone) - cont nebs  and VEST PT, O2 qhs and prn  Length of stay, days: 1  Kohlton Gilpatrick A. Asa Lente, MD 01/08/2017, 1:37 PM

## 2017-01-09 ENCOUNTER — Observation Stay (HOSPITAL_COMMUNITY)
Admission: AD | Admit: 2017-01-09 | Discharge: 2017-01-10 | Disposition: A | Discharge status: Rehabilitation Facility | Payer: Medicare HMO | Source: Ambulatory Visit | Attending: Internal Medicine | Admitting: Internal Medicine

## 2017-01-09 ENCOUNTER — Inpatient Hospital Stay (HOSPITAL_COMMUNITY): Payer: Medicare HMO

## 2017-01-09 ENCOUNTER — Inpatient Hospital Stay (HOSPITAL_COMMUNITY): Payer: Medicare HMO | Admitting: Physical Therapy

## 2017-01-09 DIAGNOSIS — R202 Paresthesia of skin: Secondary | ICD-10-CM | POA: Diagnosis present

## 2017-01-09 DIAGNOSIS — Z9981 Dependence on supplemental oxygen: Secondary | ICD-10-CM | POA: Insufficient documentation

## 2017-01-09 DIAGNOSIS — G441 Vascular headache, not elsewhere classified: Secondary | ICD-10-CM

## 2017-01-09 DIAGNOSIS — I824Y2 Acute embolism and thrombosis of unspecified deep veins of left proximal lower extremity: Secondary | ICD-10-CM

## 2017-01-09 DIAGNOSIS — Z882 Allergy status to sulfonamides status: Secondary | ICD-10-CM | POA: Insufficient documentation

## 2017-01-09 DIAGNOSIS — I2510 Atherosclerotic heart disease of native coronary artery without angina pectoris: Secondary | ICD-10-CM | POA: Insufficient documentation

## 2017-01-09 DIAGNOSIS — J439 Emphysema, unspecified: Secondary | ICD-10-CM | POA: Diagnosis present

## 2017-01-09 DIAGNOSIS — Z7902 Long term (current) use of antithrombotics/antiplatelets: Secondary | ICD-10-CM | POA: Insufficient documentation

## 2017-01-09 DIAGNOSIS — Z881 Allergy status to other antibiotic agents status: Secondary | ICD-10-CM | POA: Insufficient documentation

## 2017-01-09 DIAGNOSIS — I82409 Acute embolism and thrombosis of unspecified deep veins of unspecified lower extremity: Secondary | ICD-10-CM | POA: Diagnosis present

## 2017-01-09 DIAGNOSIS — I824Z9 Acute embolism and thrombosis of unspecified deep veins of unspecified distal lower extremity: Secondary | ICD-10-CM

## 2017-01-09 DIAGNOSIS — D72829 Elevated white blood cell count, unspecified: Secondary | ICD-10-CM

## 2017-01-09 DIAGNOSIS — Z91040 Latex allergy status: Secondary | ICD-10-CM | POA: Insufficient documentation

## 2017-01-09 DIAGNOSIS — J438 Other emphysema: Secondary | ICD-10-CM | POA: Diagnosis not present

## 2017-01-09 DIAGNOSIS — R569 Unspecified convulsions: Secondary | ICD-10-CM | POA: Insufficient documentation

## 2017-01-09 DIAGNOSIS — I8290 Acute embolism and thrombosis of unspecified vein: Secondary | ICD-10-CM

## 2017-01-09 DIAGNOSIS — I480 Paroxysmal atrial fibrillation: Secondary | ICD-10-CM | POA: Diagnosis not present

## 2017-01-09 DIAGNOSIS — R609 Edema, unspecified: Secondary | ICD-10-CM

## 2017-01-09 DIAGNOSIS — J449 Chronic obstructive pulmonary disease, unspecified: Secondary | ICD-10-CM | POA: Insufficient documentation

## 2017-01-09 DIAGNOSIS — I824Z2 Acute embolism and thrombosis of unspecified deep veins of left distal lower extremity: Principal | ICD-10-CM | POA: Insufficient documentation

## 2017-01-09 DIAGNOSIS — G4733 Obstructive sleep apnea (adult) (pediatric): Secondary | ICD-10-CM | POA: Insufficient documentation

## 2017-01-09 DIAGNOSIS — N182 Chronic kidney disease, stage 2 (mild): Secondary | ICD-10-CM | POA: Diagnosis present

## 2017-01-09 DIAGNOSIS — Z880 Allergy status to penicillin: Secondary | ICD-10-CM | POA: Insufficient documentation

## 2017-01-09 DIAGNOSIS — K219 Gastro-esophageal reflux disease without esophagitis: Secondary | ICD-10-CM | POA: Insufficient documentation

## 2017-01-09 DIAGNOSIS — I82499 Acute embolism and thrombosis of other specified deep vein of unspecified lower extremity: Secondary | ICD-10-CM | POA: Diagnosis not present

## 2017-01-09 DIAGNOSIS — R112 Nausea with vomiting, unspecified: Secondary | ICD-10-CM

## 2017-01-09 DIAGNOSIS — Z79899 Other long term (current) drug therapy: Secondary | ICD-10-CM | POA: Insufficient documentation

## 2017-01-09 DIAGNOSIS — Z87891 Personal history of nicotine dependence: Secondary | ICD-10-CM | POA: Insufficient documentation

## 2017-01-09 DIAGNOSIS — Z887 Allergy status to serum and vaccine status: Secondary | ICD-10-CM | POA: Insufficient documentation

## 2017-01-09 DIAGNOSIS — C7800 Secondary malignant neoplasm of unspecified lung: Secondary | ICD-10-CM | POA: Insufficient documentation

## 2017-01-09 DIAGNOSIS — I4891 Unspecified atrial fibrillation: Secondary | ICD-10-CM | POA: Diagnosis present

## 2017-01-09 LAB — BASIC METABOLIC PANEL
ANION GAP: 11 (ref 5–15)
BUN: 36 mg/dL — ABNORMAL HIGH (ref 6–20)
CALCIUM: 9 mg/dL (ref 8.9–10.3)
CO2: 21 mmol/L — AB (ref 22–32)
Chloride: 104 mmol/L (ref 101–111)
Creatinine, Ser: 0.96 mg/dL (ref 0.44–1.00)
GFR calc Af Amer: >60 mL/min (ref >60)
GFR calc non Af Amer: 56 mL/min — ABNORMAL LOW (ref >60)
GLUCOSE: 116 mg/dL — AB (ref 65–99)
Potassium: 4.4 mmol/L (ref 3.5–5.1)
Sodium: 136 mmol/L (ref 135–145)

## 2017-01-09 LAB — URINE CULTURE

## 2017-01-09 MED ORDER — ONDANSETRON HCL 4 MG PO TABS: 4.0000 mg | ORAL_TABLET | Freq: Four times a day (QID) | ORAL | Status: DC | PRN

## 2017-01-09 MED ORDER — ACETAMINOPHEN 325 MG PO TABS: 650.0000 mg | ORAL_TABLET | Freq: Four times a day (QID) | ORAL | Status: DC | PRN

## 2017-01-09 MED ORDER — LEVETIRACETAM 500 MG PO TABS
500.0000 mg | ORAL_TABLET | Freq: Two times a day (BID) | ORAL | Status: DC
  Administered 2017-01-09 – 2017-01-10 (×2): 500 mg via ORAL
  Filled 2017-01-09 (×2): qty 1

## 2017-01-09 MED ORDER — IPRATROPIUM-ALBUTEROL 0.5-2.5 (3) MG/3ML IN SOLN
3.0000 mL | Freq: Three times a day (TID) | RESPIRATORY_TRACT | Status: DC
  Administered 2017-01-09: 3 mL via RESPIRATORY_TRACT
  Filled 2017-01-09 (×2): qty 3

## 2017-01-09 MED ORDER — HYDROCODONE-HOMATROPINE 5-1.5 MG/5ML PO SYRP: 5.0000 mL | ORAL_SOLUTION | Freq: Every evening | ORAL | Status: DC | PRN

## 2017-01-09 MED ORDER — SODIUM CHLORIDE 0.9 % IV SOLN
INTRAVENOUS | Status: DC
  Administered 2017-01-10 (×2): via INTRAVENOUS

## 2017-01-09 MED ORDER — ONDANSETRON HCL 4 MG/2ML IJ SOLN: 4.0000 mg | Freq: Four times a day (QID) | INTRAMUSCULAR | Status: DC | PRN

## 2017-01-09 MED ORDER — PANTOPRAZOLE SODIUM 40 MG PO TBEC
40.0000 mg | DELAYED_RELEASE_TABLET | Freq: Every day | ORAL | Status: DC
  Administered 2017-01-09 – 2017-01-10 (×2): 40 mg via ORAL
  Filled 2017-01-09 (×2): qty 1

## 2017-01-09 MED ORDER — ALBUTEROL SULFATE (2.5 MG/3ML) 0.083% IN NEBU: 2.5000 mg | INHALATION_SOLUTION | RESPIRATORY_TRACT | Status: DC | PRN

## 2017-01-09 MED ORDER — DILTIAZEM HCL ER COATED BEADS 240 MG PO TB24
240.0000 mg | ORAL_TABLET | Freq: Every day | ORAL | Status: DC
  Filled 2017-01-09: qty 1

## 2017-01-09 MED ORDER — ACETAMINOPHEN 500 MG PO TABS: 1000.0000 mg | ORAL_TABLET | Freq: Four times a day (QID) | ORAL | Status: DC | PRN

## 2017-01-09 MED ORDER — SODIUM CHLORIDE 0.9% FLUSH
10.0000 mL | INTRAVENOUS | Status: DC | PRN
  Administered 2017-01-09: 10 mL
  Filled 2017-01-09: qty 40

## 2017-01-09 MED ORDER — ACETAMINOPHEN 650 MG RE SUPP: 650.0000 mg | Freq: Four times a day (QID) | RECTAL | Status: DC | PRN

## 2017-01-09 MED ORDER — LORAZEPAM 2 MG/ML IJ SOLN: 1.0000 mg | Freq: Four times a day (QID) | INTRAMUSCULAR | Status: DC | PRN

## 2017-01-09 MED ORDER — PREDNISONE 10 MG PO TABS
10.0000 mg | ORAL_TABLET | ORAL | Status: DC
  Administered 2017-01-10: 10 mg via ORAL
  Filled 2017-01-09: qty 1

## 2017-01-09 MED ORDER — FUROSEMIDE 20 MG PO TABS: 20.0000 mg | ORAL_TABLET | Freq: Every day | ORAL | Status: DC | PRN

## 2017-01-09 MED ORDER — DILTIAZEM HCL ER COATED BEADS 240 MG PO CP24
240.0000 mg | ORAL_CAPSULE | Freq: Every day | ORAL | Status: DC
  Administered 2017-01-09: 240 mg via ORAL
  Filled 2017-01-09: qty 1

## 2017-01-09 NOTE — H&P (Signed)
[facility] History and Physical  Tracy Bridges DGL:[id number] DOB: 12-20-1940 DOA: 01/09/2017  Referring physician:  PCP: Sherrilee Gilles, DO   Chief Complaint: "Anything that we'll get me better."  HPI: Tracy Bridges is a 76 y.o. female  with past medical history significant for brain tumor with resection and atrial fibrillation not on anticoagulation presents for procedure. Patient had been discharged to inpatient rehabilitation after resection of a brain tumor. Patient was doing well. Developed lower leg pain and swelling. Vascular ultrasound showed DVT. Patient agreeable to Greenfield filter placement. PM&R provider spoke to interventional radiologist who advised transfer back to acute care setting and procedure to take place next day.   Review of Systems:  As per HPI otherwise 10 point review of systems negative.    Past Medical History:  Diagnosis Date  . Asthma   . Atrial fibrillation ([place])   . Emphysema/COPD ([place])   . Lung cancer [facility])    Past Surgical History:  Procedure Laterality Date  . ABDOMINAL HYSTERECTOMY    . APPLICATION OF CRANIAL NAVIGATION Left 12/30/2016   Procedure: APPLICATION OF CRANIAL NAVIGATION;  Surgeon: Ashok Pall, MD;  Location: [place];  Service: Neurosurgery;  Laterality: Left;  APPLICATION OF CRANIAL NAVIGATION  . CESAREAN SECTION    . CRANIOTOMY Left 12/30/2016   Procedure: CRANIOTOMY TUMOR EXCISION;  Surgeon: Ashok Pall, MD;  Location: [place];  Service: Neurosurgery;  Laterality: Left;  CRANIOTOMY TUMOR EXCISION  . HERNIA REPAIR    . LUNG REMOVAL, PARTIAL  04/1997   Social History:  reports that she quit smoking about 38 years ago. Her smoking use included Cigarettes. She has never used smokeless tobacco. She reports that she does not drink alcohol or use drugs.  Allergies  Allergen Reactions  . Aspirin Shortness Of Breath  . Atenolol Swelling    Throat swelled and closed up  . Boniva [Ibandronic Acid] Other (See Comments)    Pt does  not remember specific reaction  . Fluticasone Anaphylaxis and Hives  . Fosamax [Alendronate Sodium] Other (See Comments)    Pt does not remember specific reaction  . Latex Swelling and Rash    Throat swelled and closed up  . Mucinex [Guaifenesin Er] Anaphylaxis  . Rocephin [Ceftriaxone Sodium In Dextrose] Anaphylaxis and Rash  . Rotateq [Rotavirus Vaccine Live Oral] Other (See Comments)    "poorly tolerated"  . Sulfa Antibiotics Other (See Comments)    Pt does not remember specific reaction  . Vancomycin Other (See Comments)    Pt does not remember specific reaction  . Penicillins Rash    Childhood reaction Has patient had a PCN reaction causing immediate rash, facial/tongue/throat swelling, SOB or lightheadedness with hypotension: Yes Has patient had a PCN reaction causing severe rash involving mucus membranes or skin necrosis: No Has patient had a PCN reaction that required hospitalization: Unknown Has patient had a PCN reaction occurring within the last 10 years: No If all of the above answers are "NO", then may proceed with Cephalosporin use.  . Tobramycin Sulfate Rash    Family History  Problem Relation Age of Onset  . Liver cancer Other   . Heart disease Other   . Brain cancer Neg Hx      Prior to Admission medications   Medication Sig Start Date End Date Taking? Authorizing Provider  acetaminophen (TYLENOL) 500 MG tablet Take 1,000 mg by mouth every 6 (six) hours as needed for headache (pain).    [provider]  albuterol [facility] HFA)  108 (90 Base) MCG/ACT inhaler Inhale 1-2 puffs into the lungs every 4 (four) hours as needed for wheezing or shortness of breath.    [provider]  Calcium Carbonate-Vitamin D (CALCIUM-D PO) Take 1 tablet by mouth at bedtime.    [provider]  ciprofloxacin (CIPRO) 500 MG tablet Take 500 mg by mouth See admin instructions. Take 1 tablet (500 mg) by mouth twice daily for 10 days as needed for infection     [provider]  clopidogrel (PLAVIX) 75 MG tablet Take 75 mg by mouth at bedtime.  07/11/13   [provider]  diltiazem (CARDIZEM LA) 240 MG 24 hr tablet Take 1 tablet (240 mg total) by mouth daily. Patient taking differently: Take 240 mg by mouth at bedtime.  07/15/13   [name], MD  EPINEPHrine (EPIPEN) 0.3 mg/0.3 mL SOAJ injection Inject 0.3 mg into the muscle once as needed (severe allergic reaction).     [provider]  furosemide (LASIX) 20 MG tablet Take 20 mg by mouth daily as needed for fluid or edema.    [provider]  HYDROcodone-homatropine (HYCODAN) 5-1.5 MG/5ML syrup Take 5 mLs by mouth at bedtime as needed for cough.    [provider]  ipratropium-albuterol (DUONEB) 0.5-2.5 (3) MG/3ML SOLN Take 3 mLs by nebulization 3 (three) times daily.  12/01/16   [provider]  Multiple Vitamin (MULTIVITAMIN WITH MINERALS) TABS tablet Take 1 tablet by mouth daily.    [provider]  omeprazole (PRILOSEC) 20 MG capsule Take 20 mg by mouth 2 (two) times daily. 07/14/13   [provider]  OXYGEN Inhale 2 L into the lungs at bedtime.    [provider]  predniSONE (DELTASONE) 10 MG tablet Take 10 mg by mouth every other day. 07/11/13   [provider]  PRESCRIPTION MEDICATION Inject into the muscle See admin instructions. 2 allergy shots weekly by Dr. Fransico Michael, [place], [place]    [provider]  salmeterol (SEREVENT DISKUS) 50 MCG/DOSE diskus inhaler Inhale 1 puff into the lungs 2 (two) times daily as needed (shortness of breath).     [provider]  Vitamin D, Ergocalciferol, (DRISDOL) 50000 UNITS CAPS capsule Take 50,000 Units by mouth See admin instructions. Take 1 capsule (50,000 units) by mouth every other Saturday    [provider]   Physical Exam: Vitals:   01/09/17 1557  BP: 134/60  Pulse: 75  Resp: 17  Temp: 98.6 F (37 C)  TempSrc: Oral  SpO2: 98%    Weight: 80.6 kg (177 lb 9.6 oz)  Height: 5\' 6"  (1.676 m)    Wt Readings from Last 3 Encounters:  01/09/17 80.6 kg (177 lb 9.6 oz)  01/09/17 77.7 kg (171 lb 3.2 oz)  01/06/17 86.2 kg (190 lb 1.6 oz)    General:  Appears calm and comfortable; A&Ox3; weak appearing Eyes:  PERRL, EOMI, normal lids, iris ENT:  grossly normal hearing, lips & tongue Neck:  no LAD, masses or thyromegaly Cardiovascular:  RRR, no m/r/g. No LE edema.  Respiratory:  CTA bilaterally, no w/r/r. Normal respiratory effort. Abdomen:  soft, ntnd Skin:  no rash or induration seen on limited exam Musculoskeletal:  grossly normal tone BUE/BLE Psychiatric:  grossly normal mood and affect, speech fluent and appropriate Neurologic:  CN 2-12 grossly intact, moves all extremities in coordinated fashion.          Labs on Admission:  Basic Metabolic Panel:  Recent Labs Lab 01/08/17 [phone]  NA 136  K 4.7  CL 104  CO2 21*  GLUCOSE 122*  BUN 34*  CREATININE 0.97  CALCIUM 9.3   Liver Function Tests:  Recent Labs Lab 01/08/17 0943  AST 24  ALT 21  ALKPHOS 49  BILITOT 0.8  PROT 5.7*  ALBUMIN 3.2*   No results for input(s): LIPASE, AMYLASE in the last 168 hours. No results for input(s): AMMONIA in the last 168 hours. CBC:  Recent Labs Lab 01/08/17 0943  WBC 22.1*  NEUTROABS 20.5*  HGB 12.2  HCT 37.2  MCV 91.2  PLT 111*   Cardiac Enzymes: No results for input(s): CKTOTAL, CKMB, CKMBINDEX, TROPONINI in the last 168 hours.  BNP (last 3 results) No results for input(s): BNP in the last 8760 hours.  ProBNP (last 3 results) No results for input(s): PROBNP in the last 8760 hours.   Serum creatinine: 0.97 mg/dL 01/08/17 0943 Estimated creatinine clearance: 52.8 mL/min  CBG: No results for input(s): GLUCAP in the last 168 hours.  Radiological Exams on Admission: No results found.  EKG: no new  Assessment/Plan Principal Problem:   DVT (deep venous thrombosis) (HCC) Active Problems:   GERD  (gastroesophageal reflux disease)   Atrial fibrillation (HCC)   Emphysema/COPD (HCC)   Paresthesia of right arm   CKD (chronic kidney disease), stage II   Focal seizure (HCC)   DVT Not a candidate for anticoagulation Plan is for Greenfield filter placement, individual has ordered a been consult did well in rehabilitation and is aware patient plan is for procedure tomorrow Nothing by mouth after midnight, MIVF  Reflux Cont PPI  Atrial fibrillation Off Eliquis, continue diltiazem  Metastatic lung cancer Status post cranial tumor resection 9/28 Defer to neurosurgery about staple removal tomorrow, will need to be consulted Continue Keppra for seizure prophylaxis Will need outpatient follow-up with neuro-oncology and medical oncology and neurosurgery  COPD When necessary albuterol TID duoneb  CKD II Monitor Cr daily Cr at baseline,  <1.0  Seizures hx Prn ativan Seizure precautions  OSA O2 at night  CAD Plavix   Code Status: FC  DVT Prophylaxis: SCD Family Communication: husband at bedside Disposition Plan: Pending Improvement  Status: inpt med surg  Elwin Mocha, MD Family Medicine [facility] [email or web address] Password [email or web address]

## 2017-01-09 NOTE — Progress Notes (Addendum)
Patient trasfered from 4W to 5W21 via bed; alert and oriented x 4; no complaints of pain;  midline in LUA saline locked; skin intact (redness on the bottom, blanchable). Orient patient to room and unit; instructed how to use the call bell and  fall risk precautions. Will continue to monitor the patient.

## 2017-01-09 NOTE — Progress Notes (Signed)
Physical Therapy Note  Patient Details  Name: Tracy Bridges MRN: [id number] Date of Birth: May 23, 1940 Today's Date: 01/09/2017    Pt not able to participate in skilled PT due to abnormal doppler and moving to 5west   Maxamillion Banas 01/09/2017, 1:45 PM

## 2017-01-09 NOTE — Progress Notes (Signed)
RT Note: Placed patient on 2L O2 per order for OSA.  Husband also mentions placing pt on O2 per her home regimen.

## 2017-01-09 NOTE — Consult Note (Signed)
Chief Complaint: Patient was seen in consultation today for IVC filter placement  Referring Physician(s): [place]  Supervising Physician: Corrie Mckusick  Patient Status: [facility] - In-pt  History of Present Illness: Tracy Bridges is a 76 y.o. female remote smoker with past medical history significant for paroxysmal atrial fibrillation on Eliquis, COPD, right upper lobe lung cancer with resection at [facility] in 1999 (no radiation or chemotherapy) who was admitted to [facility] on 9/23 with right-sided weakness, diminished sensation in right leg for approximately 1 month along with headache and some right arm " jerking". Subsequent CT of head revealed left frontal lobe mass concerning for malignancy. Patient underwent tumor resection on 9/28 with pathology revealing findings most consistent with metastatic lung carcinoma. On 10/7 the patient underwent LE venous Doppler study which revealed left lower extremity DVT involving the gastrocnemius veins. Request now received for IVC filter placement due to short term(30 days) anticoagulation contraindication from recent neurosurgery.  Past Medical History:  Diagnosis Date  . Asthma   . Atrial fibrillation ([place])   . Emphysema/COPD ([place])   . Lung cancer [facility])     Past Surgical History:  Procedure Laterality Date  . ABDOMINAL HYSTERECTOMY    . APPLICATION OF CRANIAL NAVIGATION Left 12/30/2016   Procedure: APPLICATION OF CRANIAL NAVIGATION;  Surgeon: Ashok Pall, MD;  Location: [place];  Service: Neurosurgery;  Laterality: Left;  APPLICATION OF CRANIAL NAVIGATION  . CESAREAN SECTION    . CRANIOTOMY Left 12/30/2016   Procedure: CRANIOTOMY TUMOR EXCISION;  Surgeon: Ashok Pall, MD;  Location: [place];  Service: Neurosurgery;  Laterality: Left;  CRANIOTOMY TUMOR EXCISION  . HERNIA REPAIR    . LUNG REMOVAL, PARTIAL  04/1997    Allergies: Aspirin; Atenolol; Boniva [ibandronic acid]; Fluticasone; Fosamax [alendronate sodium]; Latex; Mucinex  [guaifenesin er]; Rocephin [ceftriaxone sodium in dextrose]; Rotateq [rotavirus vaccine live oral]; Sulfa antibiotics; Vancomycin; Penicillins; and Tobramycin sulfate  Medications: Prior to Admission medications   Medication Sig Start Date End Date Taking? Authorizing Provider  acetaminophen (TYLENOL) 500 MG tablet Take 1,000 mg by mouth every 6 (six) hours as needed for headache (pain).   Yes [provider]  albuterol (PROAIR HFA) 108 (90 Base) MCG/ACT inhaler Inhale 1-2 puffs into the lungs every 4 (four) hours as needed for wheezing or shortness of breath.   Yes [provider]  Calcium Carbonate-Vitamin D (CALCIUM-D PO) Take 1 tablet by mouth at bedtime.   Yes [provider]  ciprofloxacin (CIPRO) 500 MG tablet Take 500 mg by mouth See admin instructions. Take 1 tablet (500 mg) by mouth twice daily for 10 days as needed for infection   Yes [provider]  clopidogrel (PLAVIX) 75 MG tablet Take 75 mg by mouth at bedtime.  07/11/13  Yes [provider]  diltiazem (CARDIZEM LA) 240 MG 24 hr tablet Take 1 tablet (240 mg total) by mouth daily. Patient taking differently: Take 240 mg by mouth at bedtime.  07/15/13  Yes [name], MD  EPINEPHrine (EPIPEN) 0.3 mg/0.3 mL SOAJ injection Inject 0.3 mg into the muscle once as needed (severe allergic reaction).    Yes [provider]  furosemide (LASIX) 20 MG tablet Take 20 mg by mouth daily as needed for fluid or edema.   Yes [provider]  HYDROcodone-homatropine (HYCODAN) 5-1.5 MG/5ML syrup Take 5 mLs by mouth at bedtime as needed for cough.   Yes [provider]  ipratropium-albuterol (DUONEB) 0.5-2.5 (3) MG/3ML SOLN Take 3 mLs by nebulization 3 (  three) times daily.  12/01/16  Yes [provider]  Multiple Vitamin (MULTIVITAMIN WITH MINERALS) TABS tablet Take 1 tablet by mouth daily.   Yes [provider]  omeprazole (PRILOSEC) 20 MG capsule Take 20 mg by  mouth 2 (two) times daily. 07/14/13  Yes [provider]  OXYGEN Inhale 2 L into the lungs at bedtime.   Yes [provider]  predniSONE (DELTASONE) 10 MG tablet Take 10 mg by mouth every other day. 07/11/13  Yes [provider]  PRESCRIPTION MEDICATION Inject into the muscle See admin instructions. 2 allergy shots weekly by Dr. Fransico Michael, [place], [place]   Yes [provider]  salmeterol (SEREVENT DISKUS) 50 MCG/DOSE diskus inhaler Inhale 1 puff into the lungs 2 (two) times daily as needed (shortness of breath).    Yes [provider]  Vitamin D, Ergocalciferol, (DRISDOL) 50000 UNITS CAPS capsule Take 50,000 Units by mouth See admin instructions. Take 1 capsule (50,000 units) by mouth every other Saturday   Yes [provider]     Family History  Problem Relation Age of Onset  . Liver cancer Other   . Heart disease Other   . Brain cancer Neg Hx     Social History   Social History  . Marital status: Married    Spouse name: N/A  . Number of children: N/A  . Years of education: N/A   Social History Main Topics  . Smoking status: Former Smoker    Types: Cigarettes    Quit date: 71  . Smokeless tobacco: Never Used  . Alcohol use No  . Drug use: No  . Sexual activity: Not Asked   Other Topics Concern  . None   Social History Narrative  . None     Review of Systems currently denies fever, chest pain, worsening dyspnea, cough, or abnormal bleeding. She does have minor headache and some occasional nausea along with vomiting after eating at certain times  Vital Signs: BP (!) 175/68   Pulse (!) 102   Temp 98.1 F (36.7 C) (Oral)   Resp 16   Wt 171 lb 3.2 oz (77.7 kg)   SpO2 95%   BMI 27.63 kg/m   Physical Exam awake, alert. Chest with distant but clear breath sounds bilaterally. Heart with regular rate and rhythm. Abdomen soft, positive bowel sounds, nontender. Trace bilateral lower extremity edema. Strength in right  upper extremity 4/5, right lower extremity through 3/5.  Imaging: Ct Head Wo Contrast  Result Date: 01/01/2017 CLINICAL DATA:  Nausea and vertigo. Postop tumor resection 12/30/2016. EXAM: CT HEAD WITHOUT CONTRAST TECHNIQUE: Contiguous axial images were obtained from the base of the skull through the vertex without intravenous contrast. COMPARISON:  Brain MRI 12/30/2016 FINDINGS: Brain: Postoperative changes are again seen in the left frontoparietal region near the vertex related to recent tumor resection. Small volume blood products are again seen at the resection site, not grossly increased compared to the MRI. Mild pneumocephalus is noted. Surrounding vasogenic edema has not significantly changed. Patchy hypodensities elsewhere in the cerebral white matter bilaterally are nonspecific but compatible with moderate chronic small vessel ischemic disease. There is no evidence of acute large territory infarct, new intracranial hemorrhage, or midline shift. A very small extra-axial hematoma is noted subjacent to the craniotomy, similar to the MRI and without significant mass effect. There is mild cerebral atrophy. Vascular: Calcified atherosclerosis at the skullbase. No hyperdense vessel. Skull: Left frontoparietal craniotomy.  Skin staples in place. Sinuses/Orbits: Bilateral cataract extraction. Prior  functional sinus surgery. Clear mastoid air cells. Other: None. IMPRESSION: 1. Similar appearance of postoperative changes in the left frontoparietal region. 2. No evidence of acute intracranial abnormality. Electronically Signed   By: Logan Bores M.D.   On: 01/01/2017 10:50   Ct Head Wo Contrast  Result Date: 12/26/2016 CLINICAL DATA:  RIGHT-sided weakness for several weeks EXAM: CT HEAD WITHOUT CONTRAST TECHNIQUE: Contiguous axial images were obtained from the base of the skull through the vertex COMPARISON:  None. FINDINGS: Brain: High-density ovoid mass in the high LEFT cerebral hemisphere / frontal lobe  measures 3.7 x 2.8 by 3.1 cm (volume = 17 cm^3). There is vasogenic edema surrounding this ovoid mass. No extra-axial fluid collections. No intraventricular hemorrhage. No midline shift or mass effect. Basal cisterns are patent. Vascular: No hyperdense vessel or unexpected calcification. Skull: Normal. Negative for fracture or focal lesion. Sinuses/Orbits: Paranasal sinuses and mastoid air cells are clear. Orbits are clear. Other: None. IMPRESSION: 1. Large ovoid mass in the high LEFT frontal lobe with surrounding vasogenic edema is favored an intra-axial neoplasm. Parenchymal hematoma or meningioma are less favored. Recommend brain MRI with contrast for further evaluation. 2. No extra-axial fluid collections. 3. No worrisome mass effect. Findings conveyed toDANIELLE RAY on 12/26/2016  at15:40. Electronically Signed   By: Suzy Bouchard M.D.   On: 12/26/2016 15:41   [place]alth Ct Head W Contrast  Result Date: 12/29/2016 CLINICAL DATA:  Pre-surgical planning for solitary frontoparietal mass. History of lung cancer. EXAM: CT HEAD WITH CONTRAST TECHNIQUE: Contiguous axial images were obtained from the base of the skull through the vertex with intravenous contrast. CONTRAST:  15mL ISOVUE-300 IOPAMIDOL (ISOVUE-300) INJECTION 61% COMPARISON:  CT HEAD and MRI head December 26, 2016 FINDINGS: BRAIN: 2.8 x 3.5 x 3.5 cm (transverse by AP by CC) heterogeneously enhancing intraparenchymal LEFT frontoparietal mass involving gray-white matter again noted with surrounding low-density vasogenic edema and regional mass effect. Mass does not extend to superior sagittal sinus. No dural tail. No midline shift. No hydrocephalus. No acute large vascular territory infarcts. Patchy supratentorial white matter hypodensities exclusive of the aforementioned abnormality. Limited assessment for hemorrhage on this postcontrast examination. No abnormal extra-axial fluid collections. VASCULAR: Moderate calcific atherosclerosis of the carotid  siphons. SKULL: No skull fracture. No destructive bony lesions with particular attention to LEFT frontoparietal convexity. No significant scalp soft tissue swelling. SINUSES/ORBITS: The mastoid air-cells and included paranasal sinuses are well-aerated.The included ocular globes and orbital contents are non-suspicious. OTHER: None. IMPRESSION: 1. 2.8 x 3.5 x 3.5 cm LEFT frontoparietal mass concerning for metastatic disease given history of lung cancer, less likely primary glial neoplasm. Regional mass effect without midline shift. 2. Moderate chronic small vessel ischemic disease. Electronically Signed   By: Elon Alas M.D.   On: 12/29/2016 17:55   Ct Chest W Contrast  Result Date: 12/31/2016 CLINICAL DATA:  Status post resection of left brain mass. Evaluate for metastatic disease. EXAM: CT CHEST, ABDOMEN, AND PELVIS WITH CONTRAST TECHNIQUE: Multidetector CT imaging of the chest, abdomen and pelvis was performed following the standard protocol during bolus administration of intravenous contrast. CONTRAST:  145mL ISOVUE-300 IOPAMIDOL (ISOVUE-300) INJECTION 61% COMPARISON:  CT head 12/29/2016.  MRI brain 12/30/2016. FINDINGS: CT CHEST FINDINGS Cardiovascular: There is atherosclerosis of the aorta, great vessels and coronary arteries. No acute vascular findings are demonstrated. The heart size is normal. There is no pericardial effusion. Mediastinum/Nodes: There are no enlarged mediastinal, hilar or axillary lymph nodes. 15 x 11 mm left thyroid nodule noted on image 3.  There is a moderate size hiatal hernia. Lungs/Pleura: There is no pleural effusion. Status post right upper lobe resection. Underlying emphysema noted. There is atelectasis medially in the left lower lobe. No evidence of pulmonary nodule, endobronchial lesion or confluent airspace opacity. Musculoskeletal/Chest wall: No chest wall mass or suspicious osseous findings. Right-sided thoracotomy defects noted. CT ABDOMEN AND PELVIS FINDINGS  Hepatobiliary: The liver is normal in density without focal abnormality. Dependent high density within the gallbladder lumen, likely a combination of sludge and stones. No gallbladder wall thickening, surrounding inflammation or biliary ductal dilatation. Pancreas: There are multiple small calcifications throughout the pancreatic parenchyma, likely due to chronic pancreatitis. No pancreatic mass, ductal dilatation or surrounding inflammation. Spleen: Normal in size without focal abnormality. Adrenals/Urinary Tract: Both adrenal glands appear normal. Both kidneys appear normal. No evidence of renal mass, hydronephrosis or urinary tract calculus. There is air in the bladder lumen, likely iatrogenic. No bladder wall thickening seen. Stomach/Bowel: No evidence of bowel wall thickening, distention or surrounding inflammatory change. Mild diverticular changes throughout the distal colon. As above, moderate-size hiatal hernia. Vascular/Lymphatic: There are no enlarged abdominal or pelvic lymph nodes. Aortic and branch vessel atherosclerosis. Reproductive: Hysterectomy.  No evidence of adnexal mass. Other: No evidence of abdominal wall mass or hernia. No ascites. Musculoskeletal: No acute or significant osseous findings. There are mild degenerative changes throughout the spine. IMPRESSION: 1. No evidence of metastatic disease within the chest, abdomen or pelvis. No primary malignancy identified. 2. Previous right upper lobe resection. Underlying emphysema and left lower lobe atelectasis noted. 3. Left thyroid nodule, indeterminate in etiology, but of doubtful clinical significance. Consider ultrasound correlation. 4.  Aortic Atherosclerosis (ICD10-I70.0). 5. Moderate-size hiatal hernia. 6. Sequela of chronic pancreatitis. Electronically Signed   By: Richardean Sale M.D.   On: 12/31/2016 20:20   Mr Jeri Cos [place] Contrast  Result Date: 12/30/2016 CLINICAL DATA:  76 y/o  F; EXAM: MRI HEAD WITHOUT AND WITH CONTRAST TECHNIQUE:  Multiplanar, multiecho pulse sequences of the brain and surrounding structures were obtained without and with intravenous contrast. CONTRAST:  93mL MULTIHANCE GADOBENATE DIMEGLUMINE 529 MG/ML IV SOLN COMPARISON:  None. FINDINGS: Brain: Resection cavity within the left frontoparietal junction containing blood products which demonstrate T1 shortening and susceptibility blooming. Faint reduced diffusion of the cavity margins is likely related to the presence of blood products. No findings of brain parenchymal infarction. At the anterior cavity margin there is an 8 x 13 x 14 mm focus of enhancement (series 14, image 14 and series 12, image 43). No additional focus of enhancement is identified within the brain. T2 FLAIR signal abnormality surrounding the cavity is decreased from the preoperative MRI compatible with interval decrease in edema. Mass effect associated with the resection cavity and edema partially effaces the left lateral ventricle, there is no midline shift or herniation. Postsurgical changes related to the left frontoparietal craniotomy include subjacent dural enhancement and a thin postoperative extra-axial collection. There are skin staples in the overlying scalp and mild scalp edema. There is a small volume of pneumocephalus collected predominantly over the left frontal lobe. Stable background of mild chronic microvascular ischemic changes and parenchymal volume loss of the brain for age. Vascular: Normal flow voids. Skull and upper cervical spine: As above. Sinuses/Orbits: Negative. Other: None. IMPRESSION: 1. Expected postsurgical changes related to left frontoparietal junction region tumor resection and craniotomy. 2. Nodular focus of enhancement at the anterior cavity margin measuring up to 14 mm may represent residual neoplasm, attention at follow-up is recommended. 3. No  new distant metastasis identified. Electronically Signed   By: Kristine Garbe M.D.   On: 12/30/2016 22:59   Mr Jeri Cos And Wo Contrast  Result Date: 12/26/2016 CLINICAL DATA:  Seizure. Right-sided weakness. On Eliquis for atrial fibrillation. History of lung cancer 19 years ago. EXAM: MRI HEAD WITHOUT AND WITH CONTRAST TECHNIQUE: Multiplanar, multiecho pulse sequences of the brain and surrounding structures were obtained without and with intravenous contrast. CONTRAST:  20 mL MultiHance IV COMPARISON:  CT head 12/26/2016 FINDINGS: Brain: Image quality degraded by motion. Enhancing mass lesion in the high left frontal parietal lobe measuring 3.8 x 2.8 x 4.2 cm. The mass is hyperdense on CT with a mild amount of susceptibility on MRI. This could be calcification or hemorrhage. The mass is well-circumscribed and solid. Mild surrounding vasogenic edema. The mass extends to the dural surface. It is difficult determine if this is intra-axial or extra-axial. No other mass lesion. Atrophy and chronic white matter ischemia. 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 No acute infarct. Ventricle size normal. No shift of the midline structures. Vascular: Normal arterial flow void Skull and upper cervical spine: Negative Sinuses/Orbits: Bilateral cataract removal. Paranasal sinuses clear. Other: None IMPRESSION: Image quality degraded by motion. Solitary mass in the left frontal parietal convexity with surrounding edema. It is difficult determine if this is intra-axial or extra-axial however the mass does extend to the dural surface. Possible meningioma. Metastatic disease possible. Atrophy and chronic microvascular ischemic change. No acute infarct. Electronically Signed   By: Franchot Gallo M.D.   On: 12/26/2016 19:03   Ct Abdomen Pelvis W Contrast  Result Date: 12/31/2016 CLINICAL DATA:  Status post resection of left brain mass. Evaluate for metastatic disease. EXAM: CT CHEST, ABDOMEN, AND PELVIS WITH CONTRAST TECHNIQUE: Multidetector CT imaging of the chest, abdomen and pelvis was performed following the standard protocol during  bolus administration of intravenous contrast. CONTRAST:  121mL ISOVUE-300 IOPAMIDOL (ISOVUE-300) INJECTION 61% COMPARISON:  CT head 12/29/2016.  MRI brain 12/30/2016. FINDINGS: CT CHEST FINDINGS Cardiovascular: There is atherosclerosis of the aorta, great vessels and coronary arteries. No acute vascular findings are demonstrated. The heart size is normal. There is no pericardial effusion. Mediastinum/Nodes: There are no enlarged mediastinal, hilar or axillary lymph nodes. 15 x 11 mm left thyroid nodule noted on image 3. There is a moderate size hiatal hernia. Lungs/Pleura: There is no pleural effusion. Status post right upper lobe resection. Underlying emphysema noted. There is atelectasis medially in the left lower lobe. No evidence of pulmonary nodule, endobronchial lesion or confluent airspace opacity. Musculoskeletal/Chest wall: No chest wall mass or suspicious osseous findings. Right-sided thoracotomy defects noted. CT ABDOMEN AND PELVIS FINDINGS Hepatobiliary: The liver is normal in density without focal abnormality. Dependent high density within the gallbladder lumen, likely a combination of sludge and stones. No gallbladder wall thickening, surrounding inflammation or biliary ductal dilatation. Pancreas: There are multiple small calcifications throughout the pancreatic parenchyma, likely due to chronic pancreatitis. No pancreatic mass, ductal dilatation or surrounding inflammation. Spleen: Normal in size without focal abnormality. Adrenals/Urinary Tract: Both adrenal glands appear normal. Both kidneys appear normal. No evidence of renal mass, hydronephrosis or urinary tract calculus. There is air in the bladder lumen, likely iatrogenic. No bladder wall thickening seen. Stomach/Bowel: No evidence of bowel wall thickening, distention or surrounding inflammatory change. Mild diverticular changes throughout the distal colon. As above, moderate-size hiatal hernia. Vascular/Lymphatic: There are no enlarged  abdominal or pelvic lymph nodes. Aortic and branch vessel atherosclerosis. Reproductive: Hysterectomy.  No evidence  of adnexal mass. Other: No evidence of abdominal wall mass or hernia. No ascites. Musculoskeletal: No acute or significant osseous findings. There are mild degenerative changes throughout the spine. IMPRESSION: 1. No evidence of metastatic disease within the chest, abdomen or pelvis. No primary malignancy identified. 2. Previous right upper lobe resection. Underlying emphysema and left lower lobe atelectasis noted. 3. Left thyroid nodule, indeterminate in etiology, but of doubtful clinical significance. Consider ultrasound correlation. 4.  Aortic Atherosclerosis (ICD10-I70.0). 5. Moderate-size hiatal hernia. 6. Sequela of chronic pancreatitis. Electronically Signed   By: Richardean Sale M.D.   On: 12/31/2016 20:20    Labs:  CBC:  Recent Labs  12/29/16 0425 12/30/16 0408 01/02/17 0854 01/08/17 0943  WBC 10.8* 11.7* 12.0* 22.1*  HGB 10.9* 10.9* 10.9* 12.2  HCT 34.5* 34.4* 34.3* 37.2  PLT 139* 140* 136* 111*    COAGS:  Recent Labs  12/26/16 1413  INR 0.94  APTT 25    BMP:  Recent Labs  12/28/16 1121 12/29/16 0425 01/02/17 0854 01/08/17 0943  NA 141 140 137 136  K 3.7 4.1 4.6 4.7  CL 105 103 102 104  CO2 22 26 29  21*  GLUCOSE 191* 158* 109* 122*  BUN 22* 28* 39* 34*  CALCIUM 10.0 9.6 9.4 9.3  CREATININE 1.27* 1.17* 0.94 0.97  GFRNONAA 40* 44* 57* 55*  GFRAA 46* 51* >60 >60    LIVER FUNCTION TESTS:  Recent Labs  12/26/16 1413 01/08/17 0943  BILITOT 0.6 0.8  AST 24 24  ALT 14 21  ALKPHOS 63 49  PROT 7.0 5.7*  ALBUMIN 4.0 3.2*    TUMOR MARKERS: No results for input(s): AFPTM, CEA, CA199, CHROMGRNA in the last 8760 hours.  Assessment and Plan: 76 y.o. female remote smoker with past medical history significant for paroxysmal atrial fibrillation on Eliquis, COPD, right upper lobe lung cancer with resection at [facility] in 1999 (no radiation or  chemotherapy) who was admitted to [facility] on 9/23 with right-sided weakness, diminished sensation in right leg for approximately 1 month along with headache and some right arm " jerking". Subsequent CT of head revealed left frontal lobe mass concerning for malignancy. Patient underwent tumor resection on 9/28 with pathology revealing findings most consistent with metastatic lung carcinoma. On 10/7 the patient underwent LE venous Doppler study which revealed left lower extremity DVT involving the gastrocnemius veins. Request now received for IVC filter placement due to short term(30 days) anticoagulation contraindication from recent neurosurgery. Imaging studies have been reviewed by Dr. Laurence Ferrari. Risks and benefits discussed with the patient/husband including, but not limited to bleeding, infection, contrast induced renal failure, filter fracture or migration which can lead to emergency surgery or even death, strut penetration with damage or irritation to adjacent structures and caval thrombosis.All of the patient's questions were answered, patient is agreeable to proceed.Consent signed and in chart. Procedure tentatively scheduled for 10/8.     Thank you for this interesting consult.  I greatly enjoyed meeting Tracy Bridges and look forward to participating in their care.  A copy of this report was sent to the requesting provider on this date.  Electronically Signed: D. Rowe Robert, PA-C 01/09/2017, 2:31 PM   I spent a total of  30 minutes   in face to face in clinical consultation, greater than 50% of which was counseling/coordinating care for IVC filter placement

## 2017-01-09 NOTE — Plan of Care (Signed)
Problem: RH BOWEL ELIMINATION Goal: RH STG MANAGE BOWEL WITH ASSISTANCE STG Manage Bowel with Assistance.  Outcome: Not Progressing Last BM 9/27  Problem: RH SKIN INTEGRITY Goal: RH STG SKIN FREE OF INFECTION/BREAKDOWN Outcome: Progressing Reddened area on buttocks. Skin closed.

## 2017-01-09 NOTE — Progress Notes (Addendum)
Patient is going to be transferred to [id number] because of a procedure. I called nurse Greta and gave report, family at bedside aware about situation.

## 2017-01-09 NOTE — Progress Notes (Addendum)
Tracy Bridges is a 76 y.o. female 1941/03/05 [id number]  Subjective: Feels RUE is much stronger - Asks when staples in scalp are to be removed - Denies trouble breathing and improved since arrival of VEST to floor yesterday  Objective: Vital signs in last 24 hours: Temp:  [98.1 F (36.7 C)-98.6 F (37 C)] 98.1 F (36.7 C) (10/07 0550) Pulse Rate:  [58-102] 102 (10/07 0553) Resp:  [16-18] 16 (10/07 0550) BP: (124-175)/(42-68) 175/68 (10/07 0553) SpO2:  [94 %-100 %] 94 % (10/07 0941) Weight:  [77.7 kg (171 lb 3.2 oz)] 77.7 kg (171 lb 3.2 oz) (10/07 0600) Weight change:  Last BM Date: 12/30/16  Intake/Output from previous day: 10/06 0701 - 10/07 0700 In: 300 [P.O.:300] Out: 1450 [Urine:1450]  Physical Exam General: No apparent distress   Spouse Wayne at bedside in recliner Lungs: Normal effort. Lungs clear to auscultation. Cardiovascular: Regular rate and rhythm, no edema Neurological: improved R HP in UE now 4/5 strength with mild delay; 3/5 in RLE Wounds: scalp  Clean, dry, intact. No signs of infection.  Lab Results: BMET    Component Value Date/Time   NA 136 01/08/2017 0943   K 4.7 01/08/2017 0943   CL 104 01/08/2017 0943   CO2 21 (L) 01/08/2017 0943   GLUCOSE 122 (H) 01/08/2017 0943   BUN 34 (H) 01/08/2017 0943   CREATININE 0.97 01/08/2017 0943   CALCIUM 9.3 01/08/2017 0943   GFRNONAA 55 (L) 01/08/2017 0943   GFRAA >60 01/08/2017 0943   CBC    Component Value Date/Time   WBC 22.1 (H) 01/08/2017 0943   RBC 4.08 01/08/2017 0943   HGB 12.2 01/08/2017 0943   HCT 37.2 01/08/2017 0943   PLT 111 (L) 01/08/2017 0943   MCV 91.2 01/08/2017 0943   MCH 29.9 01/08/2017 0943   MCHC 32.8 01/08/2017 0943   RDW 14.3 01/08/2017 0943   LYMPHSABS 0.9 01/08/2017 0943   MONOABS 0.7 01/08/2017 0943   EOSABS 0.0 01/08/2017 0943   BASOSABS 0.0 01/08/2017 0943   CBG's (last 3):  No results for input(s): GLUCAP in the last 72 hours. LFT's Lab Results  Component Value Date   ALT 21 01/08/2017   AST 24 01/08/2017   ALKPHOS 49 01/08/2017   BILITOT 0.8 01/08/2017    Studies/Results: No results found.  Medications:  I have reviewed the patient's current medications. Scheduled Medications: . arformoterol  15 mcg Nebulization BID  . budesonide (PULMICORT) nebulizer solution  0.5 mg Nebulization BID  . calcium-vitamin D  1 tablet Oral QHS  . dexamethasone  4 mg Oral Q8H  . diltiazem  240 mg Oral Daily  . ipratropium-albuterol  3 mL Nebulization TID  . levETIRAcetam  500 mg Oral BID  . meclizine  25 mg Oral TID  . multivitamin with minerals  1 tablet Oral Daily  . pantoprazole  40 mg Oral BID AC  . senna-docusate  2 tablet Oral QHS   PRN Medications: acetaminophen, albuterol, alum & mag hydroxide-simeth, bisacodyl, diphenhydrAMINE, HYDROcodone-acetaminophen, LORazepam, naLOXone (NARCAN)  injection, polyethylene glycol, promethazine **OR** promethazine **OR** promethazine, sodium chloride flush, sodium phosphate, traZODone  Assessment/Plan: Principal Problem:   Right sided weakness Active Problems:   Atrial fibrillation (HCC)   Emphysema/COPD (HCC)   Metastatic cancer to brain ([place])   1. R HP due to brain met- RUE coordination and motor is improved; Continue CIR and supportive med tx (steroids) 2. 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 A Fib - cont Dilt; off Eliquis as PTA due to brain met and recent NSurg 4. Emphysema and bronchiectasis - steroid dep PTA(monitor same when converting from NSurg dexamethasone); cont VEST, nebs and O2 qhs + prn  Length of stay, days: 2  Valerie A. Asa Lente, MD 01/09/2017, 10:20 AM   New problem: LLE DVT - I was called by RN re: findings on LE US done this morning. On anticoag PTA for PAF but holding same due to recent Nsurg tumor  resection. Complicated clinical situation given high risk for hypercoag state with untreated malignancy but poor candidate for resuming anticoag, pref not for 30d post Neuro surg intervention per phone consult with heme-on Dr. Heath Lark. Alvy Bimler rec transfer back to acute unit for consultation by IR for consideration of IVC filter and also notes proceeding with IVC filter does not prevent future clots, does not treat existing clot and does not address CVA risk from PAF without anticoag. I will call [facility] to arranged DC from CIR and admission back to acute IP unit; Alvy Bimler is available for consultation as needed and will notify Shadad of new events (as he saw pt 10/3 to arrange OP follow up but pt has not yet been seen by others in med onc service) Gwendolyn Grant, MD 11:59 AM  I have spoken with pt, her spouse, and IR doc on call McC[facility] - all are in agreement to proceed w/ IVC filter placement for PE prophylaxis given high risk for PE complication and lack of other tx options. Pt/spouse understand filter would not prevent other clots or reduced risk of CVA. McCollough favors filter to be "permanent" given pt other multiple co morbidities and poor candidacy to resume anticoags. IR instructs admitting doc to place order for IVC filter by IR and expect procedure can be done Mon 11/8. I spoke with Dr. Aggie Moats, on call for [facility] who understands and agrees to accept the admission. RN notified and pt to be DC's from CIR once bed on IP unit ready. Gwendolyn Grant 12:56 PM

## 2017-01-09 NOTE — Progress Notes (Signed)
Dr in call was informed about abnormal result of venous Doppler.

## 2017-01-09 NOTE — IPOC Note (Signed)
Overall Plan of Care [facility]) Patient Details Name: Tracy Bridges MRN: [id number] DOB: 1940-09-09  Admitting Diagnosis: Right sided weakness, lung cancer mets to brain  Hospital Problems: Principal Problem:   Right sided weakness Active Problems:   Atrial fibrillation (HCC)   Emphysema/COPD ([place])   Metastatic cancer to brain [facility])     Functional Problem List: Nursing Behavior, Bladder, Bowel, Edema, Medication Management, Nutrition, Safety, Pain, Sensory, Skin Integrity  PT Balance, Perception, Endurance, Motor, Pain, Sensory, Safety  OT Balance, Safety, Sensory, Endurance, Motor, Vision  SLP    TR         Basic ADL's: OT Grooming, Bathing, Dressing, Toileting, Eating     Advanced  ADL's: OT Simple Meal Preparation     Transfers: PT Bed Mobility, Bed to Chair, Car, Furniture, Futures trader, Metallurgist: PT Emergency planning/management officer, Ambulation, Stairs     Additional Impairments: OT Fuctional Use of Upper Extremity  SLP        TR      Anticipated Outcomes Item Anticipated Outcome  Self Feeding Supervision/setup  Swallowing      Basic self-care  Supervision/setup  Toileting  Supervision/setup   Bathroom Transfers Supervision/setup  Bowel/Bladder  min assist  Transfers  supervision  Locomotion  supervision  Communication     Cognition     Pain  less<3  Safety/Judgment  min assist   Therapy Plan: PT Intensity: Minimum of 1-2 x/day ,45 to 90 minutes PT Frequency: 5 out of 7 days PT Duration Estimated Length of Stay: 12-14 days OT Intensity: Minimum of 1-2 x/day, 45 to 90 minutes OT Frequency: 5 out of 7 days OT Duration/Estimated Length of Stay: 12--14 days      Team Interventions: Nursing Interventions Patient/Family Education, Bladder Management, Bowel Management, Disease Management/Prevention, Pain Management, Medication Management, Skin Care/Wound Management, Dysphagia/Aspiration Precaution Training  PT interventions  Ambulation/gait training, Disease management/prevention, Pain management, Stair training, Visual/perceptual remediation/compensation, Wheelchair propulsion/positioning, Therapeutic Activities, Patient/family education, DME/adaptive equipment instruction, Training and development officer, Cognitive remediation/compensation, Functional electrical stimulation, Psychosocial support, Therapeutic Exercise, UE/LE Strength taining/ROM, Skin care/wound management, Functional mobility training, Community reintegration, Discharge planning, Neuromuscular re-education, Splinting/orthotics, UE/LE Coordination activities  OT Interventions Balance/vestibular training, Discharge planning, Pain management, Self Care/advanced ADL retraining, Therapeutic Activities, UE/LE Coordination activities, Disease mangement/prevention, Functional mobility training, Patient/family education, Therapeutic Exercise, Visual/perceptual remediation/compensation, Community reintegration, Engineer, drilling, Neuromuscular re-education, Psychosocial support, UE/LE Strength taining/ROM, Wheelchair propulsion/positioning  SLP Interventions    TR Interventions    SW/CM Interventions     Barriers to Discharge MD  Medical stability  Nursing      PT Inaccessible home environment, Home environment access/layout Pt reports husband able to provide Min A physically at home, has been providing that level of assistance for a few months. Son and daughter both live locally as well.   OT Home environment access/layout  (bathroom accessibility)  SLP      SW       Team Discharge Planning: Destination: PT-Home ,OT- Home , SLP-  Projected Follow-up: PT-Outpatient PT (OPPT if pt performs safe car transfer), OT-  Home health OT, Outpatient OT, SLP-  Projected Equipment Needs: PT-To be determined, OT- Tub/shower seat, To be determined, SLP-  Equipment Details: PT-already has rollator, OT-  Patient/family involved in discharge planning: PT-  Patient, Family member/caregiver,  OT-Patient, Family member/caregiver, SLP-   MD ELOS: 12-14 days Medical Rehab Prognosis:  Excellent Assessment: The patient has been admitted for CIR therapies with the diagnosis of right hemiparesis due  to lung cancer metastasis to the brain. The team will be addressing functional mobility, strength, stamina, balance, safety, adaptive techniques and equipment, self-care, bowel and bladder mgt, patient and caregiver education, NMR, ego support, community reentry. Goals have been set at supervision for mobility and self-care .    Meredith Staggers, MD, FAAPMR      See Team Conference Notes for weekly updates to the plan of care

## 2017-01-09 NOTE — Progress Notes (Signed)
**  Preliminary report by tech**  Bilateral lower extremity venous duplex complete. There is evidence of acute, intramuscular deep vein thrombosis involving the gastrocnemius veins of the left lower extremity. There is no evidence of superficial vein thrombosis involving the left lower extremity. There is no evidence of deep or superficial vein thrombosis involving the right lower extremity. There is no evidence of a Baker's cyst bilaterally. Results were given to the patient's nurse, Jiles Garter.  01/09/17 10:21 AM Tracy Bridges RVT

## 2017-01-10 ENCOUNTER — Inpatient Hospital Stay (HOSPITAL_COMMUNITY): Payer: Medicare HMO | Admitting: Physical Therapy

## 2017-01-10 ENCOUNTER — Inpatient Hospital Stay (HOSPITAL_REHABILITATION)
Admission: RE | Admit: 2017-01-10 | Discharge: 2017-01-28 | Disposition: A | Discharge status: Other Acute Inpatient Hospital | Payer: Medicare HMO | Source: Intra-hospital | Attending: Physical Medicine & Rehabilitation | Admitting: Physical Medicine & Rehabilitation

## 2017-01-10 ENCOUNTER — Observation Stay (HOSPITAL_COMMUNITY): Payer: Medicare HMO

## 2017-01-10 ENCOUNTER — Inpatient Hospital Stay (HOSPITAL_COMMUNITY): Payer: Medicare HMO

## 2017-01-10 ENCOUNTER — Encounter (HOSPITAL_COMMUNITY): Payer: Self-pay | Admitting: Interventional Radiology

## 2017-01-10 ENCOUNTER — Inpatient Hospital Stay (HOSPITAL_COMMUNITY): Payer: Medicare HMO | Admitting: Occupational Therapy

## 2017-01-10 DIAGNOSIS — Z85118 Personal history of other malignant neoplasm of bronchus and lung: Secondary | ICD-10-CM

## 2017-01-10 DIAGNOSIS — F40240 Claustrophobia: Secondary | ICD-10-CM

## 2017-01-10 DIAGNOSIS — R110 Nausea: Secondary | ICD-10-CM

## 2017-01-10 DIAGNOSIS — Z886 Allergy status to analgesic agent status: Secondary | ICD-10-CM

## 2017-01-10 DIAGNOSIS — I82499 Acute embolism and thrombosis of other specified deep vein of unspecified lower extremity: Secondary | ICD-10-CM | POA: Diagnosis not present

## 2017-01-10 DIAGNOSIS — G441 Vascular headache, not elsewhere classified: Secondary | ICD-10-CM | POA: Diagnosis present

## 2017-01-10 DIAGNOSIS — Z86718 Personal history of other venous thrombosis and embolism: Secondary | ICD-10-CM

## 2017-01-10 DIAGNOSIS — G4700 Insomnia, unspecified: Secondary | ICD-10-CM

## 2017-01-10 DIAGNOSIS — I482 Chronic atrial fibrillation: Secondary | ICD-10-CM

## 2017-01-10 DIAGNOSIS — Z881 Allergy status to other antibiotic agents status: Secondary | ICD-10-CM

## 2017-01-10 DIAGNOSIS — R1310 Dysphagia, unspecified: Secondary | ICD-10-CM | POA: Diagnosis present

## 2017-01-10 DIAGNOSIS — K219 Gastro-esophageal reflux disease without esophagitis: Secondary | ICD-10-CM

## 2017-01-10 DIAGNOSIS — G8191 Hemiplegia, unspecified affecting right dominant side: Secondary | ICD-10-CM | POA: Diagnosis present

## 2017-01-10 DIAGNOSIS — M79671 Pain in right foot: Secondary | ICD-10-CM

## 2017-01-10 DIAGNOSIS — D72829 Elevated white blood cell count, unspecified: Secondary | ICD-10-CM | POA: Diagnosis present

## 2017-01-10 DIAGNOSIS — K5900 Constipation, unspecified: Secondary | ICD-10-CM | POA: Diagnosis present

## 2017-01-10 DIAGNOSIS — R4701 Aphasia: Secondary | ICD-10-CM | POA: Diagnosis not present

## 2017-01-10 DIAGNOSIS — Z95828 Presence of other vascular implants and grafts: Secondary | ICD-10-CM

## 2017-01-10 DIAGNOSIS — N182 Chronic kidney disease, stage 2 (mild): Secondary | ICD-10-CM | POA: Diagnosis present

## 2017-01-10 DIAGNOSIS — R569 Unspecified convulsions: Secondary | ICD-10-CM | POA: Diagnosis not present

## 2017-01-10 DIAGNOSIS — J439 Emphysema, unspecified: Secondary | ICD-10-CM | POA: Diagnosis present

## 2017-01-10 DIAGNOSIS — I480 Paroxysmal atrial fibrillation: Secondary | ICD-10-CM

## 2017-01-10 DIAGNOSIS — T1490XA Injury, unspecified, initial encounter: Secondary | ICD-10-CM

## 2017-01-10 DIAGNOSIS — R001 Bradycardia, unspecified: Secondary | ICD-10-CM | POA: Diagnosis not present

## 2017-01-10 DIAGNOSIS — I959 Hypotension, unspecified: Secondary | ICD-10-CM

## 2017-01-10 DIAGNOSIS — G936 Cerebral edema: Secondary | ICD-10-CM

## 2017-01-10 DIAGNOSIS — Z880 Allergy status to penicillin: Secondary | ICD-10-CM

## 2017-01-10 DIAGNOSIS — Z888 Allergy status to other drugs, medicaments and biological substances status: Secondary | ICD-10-CM

## 2017-01-10 DIAGNOSIS — Z91040 Latex allergy status: Secondary | ICD-10-CM

## 2017-01-10 DIAGNOSIS — D696 Thrombocytopenia, unspecified: Secondary | ICD-10-CM | POA: Diagnosis present

## 2017-01-10 DIAGNOSIS — D62 Acute posthemorrhagic anemia: Secondary | ICD-10-CM | POA: Diagnosis present

## 2017-01-10 DIAGNOSIS — C7931 Secondary malignant neoplasm of brain: Secondary | ICD-10-CM | POA: Diagnosis present

## 2017-01-10 DIAGNOSIS — E876 Hypokalemia: Secondary | ICD-10-CM

## 2017-01-10 DIAGNOSIS — J438 Other emphysema: Secondary | ICD-10-CM

## 2017-01-10 DIAGNOSIS — Z801 Family history of malignant neoplasm of trachea, bronchus and lung: Secondary | ICD-10-CM

## 2017-01-10 DIAGNOSIS — I4891 Unspecified atrial fibrillation: Secondary | ICD-10-CM | POA: Diagnosis present

## 2017-01-10 DIAGNOSIS — Z85841 Personal history of malignant neoplasm of brain: Secondary | ICD-10-CM

## 2017-01-10 DIAGNOSIS — R112 Nausea with vomiting, unspecified: Secondary | ICD-10-CM

## 2017-01-10 DIAGNOSIS — Z87891 Personal history of nicotine dependence: Secondary | ICD-10-CM

## 2017-01-10 DIAGNOSIS — M21371 Foot drop, right foot: Secondary | ICD-10-CM

## 2017-01-10 DIAGNOSIS — R7989 Other specified abnormal findings of blood chemistry: Secondary | ICD-10-CM

## 2017-01-10 DIAGNOSIS — J449 Chronic obstructive pulmonary disease, unspecified: Secondary | ICD-10-CM

## 2017-01-10 DIAGNOSIS — R0600 Dyspnea, unspecified: Secondary | ICD-10-CM

## 2017-01-10 DIAGNOSIS — I824Z2 Acute embolism and thrombosis of unspecified deep veins of left distal lower extremity: Secondary | ICD-10-CM | POA: Diagnosis not present

## 2017-01-10 DIAGNOSIS — R4189 Other symptoms and signs involving cognitive functions and awareness: Secondary | ICD-10-CM

## 2017-01-10 DIAGNOSIS — R739 Hyperglycemia, unspecified: Secondary | ICD-10-CM | POA: Diagnosis present

## 2017-01-10 DIAGNOSIS — R531 Weakness: Secondary | ICD-10-CM

## 2017-01-10 HISTORY — PX: IR IVC FILTER REPOSITION / S&I /IMG GUID/MOD SED: IMG5307

## 2017-01-10 HISTORY — PX: IR IVC FILTER PLMT / S&I /IMG GUID/MOD SED: IMG701

## 2017-01-10 LAB — BASIC METABOLIC PANEL
ANION GAP: 7 (ref 5–15)
BUN: 33 mg/dL — ABNORMAL HIGH (ref 6–20)
CHLORIDE: 106 mmol/L (ref 101–111)
CO2: 26 mmol/L (ref 22–32)
Calcium: 8.8 mg/dL — ABNORMAL LOW (ref 8.9–10.3)
Creatinine, Ser: 0.94 mg/dL (ref 0.44–1.00)
GFR calc Af Amer: >60 mL/min (ref >60)
GFR, EST NON AFRICAN AMERICAN: 57 mL/min — AB (ref >60)
Glucose, Bld: 96 mg/dL (ref 65–99)
Potassium: 4.5 mmol/L (ref 3.5–5.1)
SODIUM: 139 mmol/L (ref 135–145)

## 2017-01-10 LAB — CBC
HEMATOCRIT: 32 % — AB (ref 36.0–46.0)
HEMOGLOBIN: 10.4 g/dL — AB (ref 12.0–15.0)
MCH: 29.6 pg (ref 26.0–34.0)
MCHC: 32.5 g/dL (ref 30.0–36.0)
MCV: 91.2 fL (ref 78.0–100.0)
Platelets: 114 10*3/uL — ABNORMAL LOW (ref 150–400)
RBC: 3.51 MIL/uL — ABNORMAL LOW (ref 3.87–5.11)
RDW: 15.2 % (ref 11.5–15.5)
WBC: 20.3 10*3/uL — AB (ref 4.0–10.5)

## 2017-01-10 LAB — SURGICAL PCR SCREEN
MRSA, PCR: POSITIVE — AB
Staphylococcus aureus: POSITIVE — AB

## 2017-01-10 LAB — PROTIME-INR
INR: 1.07
Prothrombin Time: 13.8 seconds (ref 11.4–15.2)

## 2017-01-10 MED ORDER — PROCHLORPERAZINE 25 MG RE SUPP: 12.5000 mg | Freq: Four times a day (QID) | RECTAL | Status: DC | PRN

## 2017-01-10 MED ORDER — TRAZODONE HCL 50 MG PO TABS
25.0000 mg | ORAL_TABLET | Freq: Every evening | ORAL | Status: DC | PRN
  Filled 2017-01-10: qty 1

## 2017-01-10 MED ORDER — MUPIROCIN 2 % EX OINT: 1.0000 "application " | TOPICAL_OINTMENT | Freq: Two times a day (BID) | CUTANEOUS | 0 refills | Status: DC

## 2017-01-10 MED ORDER — PROCHLORPERAZINE EDISYLATE 5 MG/ML IJ SOLN
5.0000 mg | Freq: Four times a day (QID) | INTRAMUSCULAR | Status: DC | PRN
Start: 2017-01-10 — End: 2017-01-28

## 2017-01-10 MED ORDER — FUROSEMIDE 20 MG PO TABS
20.0000 mg | ORAL_TABLET | Freq: Every day | ORAL | Status: DC | PRN
  Administered 2017-01-24: 20 mg via ORAL
  Filled 2017-01-10: qty 1

## 2017-01-10 MED ORDER — PROCHLORPERAZINE MALEATE 5 MG PO TABS
5.0000 mg | ORAL_TABLET | Freq: Four times a day (QID) | ORAL | Status: DC | PRN
  Administered 2017-01-13: 10 mg via ORAL
  Filled 2017-01-10 (×2): qty 2

## 2017-01-10 MED ORDER — IOPAMIDOL (ISOVUE-300) INJECTION 61%
INTRAVENOUS | Status: AC
  Filled 2017-01-10: qty 100

## 2017-01-10 MED ORDER — IOPAMIDOL (ISOVUE-300) INJECTION 61%
INTRAVENOUS | Status: AC
  Filled 2017-01-10: qty 50

## 2017-01-10 MED ORDER — CHLORHEXIDINE GLUCONATE CLOTH 2 % EX PADS: 6.0000 | MEDICATED_PAD | Freq: Every day | CUTANEOUS | Status: DC

## 2017-01-10 MED ORDER — DIPHENHYDRAMINE HCL 12.5 MG/5ML PO ELIX: 12.5000 mg | ORAL_SOLUTION | Freq: Four times a day (QID) | ORAL | Status: DC | PRN

## 2017-01-10 MED ORDER — IPRATROPIUM-ALBUTEROL 0.5-2.5 (3) MG/3ML IN SOLN
3.0000 mL | Freq: Three times a day (TID) | RESPIRATORY_TRACT | Status: DC
  Administered 2017-01-10 – 2017-01-13 (×8): 3 mL via RESPIRATORY_TRACT
  Filled 2017-01-10 (×7): qty 3

## 2017-01-10 MED ORDER — CHLORHEXIDINE GLUCONATE CLOTH 2 % EX PADS
6.0000 | MEDICATED_PAD | Freq: Every day | CUTANEOUS | Status: AC
  Administered 2017-01-11 – 2017-01-15 (×5): 6 via TOPICAL

## 2017-01-10 MED ORDER — DILTIAZEM HCL ER COATED BEADS 240 MG PO CP24
240.0000 mg | ORAL_CAPSULE | Freq: Every day | ORAL | Status: DC
  Administered 2017-01-10 – 2017-01-27 (×18): 240 mg via ORAL
  Filled 2017-01-10 (×19): qty 1

## 2017-01-10 MED ORDER — LEVETIRACETAM 500 MG PO TABS
500.0000 mg | ORAL_TABLET | Freq: Two times a day (BID) | ORAL | Status: DC
  Administered 2017-01-10 – 2017-01-28 (×36): 500 mg via ORAL
  Filled 2017-01-10 (×37): qty 1

## 2017-01-10 MED ORDER — MUPIROCIN 2 % EX OINT
1.0000 "application " | TOPICAL_OINTMENT | Freq: Two times a day (BID) | CUTANEOUS | Status: AC
  Administered 2017-01-10 – 2017-01-14 (×9): 1 via NASAL
  Filled 2017-01-10: qty 22

## 2017-01-10 MED ORDER — POLYETHYLENE GLYCOL 3350 17 G PO PACK
17.0000 g | PACK | Freq: Every day | ORAL | Status: DC | PRN
  Administered 2017-01-11 – 2017-01-12 (×2): 17 g via ORAL
  Filled 2017-01-10 (×3): qty 1

## 2017-01-10 MED ORDER — FENTANYL CITRATE (PF) 100 MCG/2ML IJ SOLN
INTRAMUSCULAR | Status: AC | PRN
  Administered 2017-01-10: 50 ug via INTRAVENOUS

## 2017-01-10 MED ORDER — ALBUTEROL SULFATE (2.5 MG/3ML) 0.083% IN NEBU: 2.5000 mg | INHALATION_SOLUTION | RESPIRATORY_TRACT | Status: DC | PRN

## 2017-01-10 MED ORDER — MIDAZOLAM HCL 2 MG/2ML IJ SOLN
INTRAMUSCULAR | Status: AC | PRN
  Administered 2017-01-10: 1 mg via INTRAVENOUS

## 2017-01-10 MED ORDER — ALUM & MAG HYDROXIDE-SIMETH 200-200-20 MG/5ML PO SUSP: 30.0000 mL | ORAL | Status: DC | PRN

## 2017-01-10 MED ORDER — LORAZEPAM 2 MG/ML IJ SOLN: 1.0000 mg | Freq: Four times a day (QID) | INTRAMUSCULAR | Status: DC | PRN

## 2017-01-10 MED ORDER — PANTOPRAZOLE SODIUM 40 MG PO TBEC
40.0000 mg | DELAYED_RELEASE_TABLET | Freq: Every day | ORAL | Status: DC
  Administered 2017-01-11 – 2017-01-13 (×3): 40 mg via ORAL
  Filled 2017-01-10 (×4): qty 1

## 2017-01-10 MED ORDER — MIDAZOLAM HCL 2 MG/2ML IJ SOLN
INTRAMUSCULAR | Status: AC
  Filled 2017-01-10: qty 2

## 2017-01-10 MED ORDER — MUPIROCIN 2 % EX OINT
1.0000 "application " | TOPICAL_OINTMENT | Freq: Two times a day (BID) | CUTANEOUS | Status: DC
  Administered 2017-01-10: 1 via NASAL
  Filled 2017-01-10: qty 22

## 2017-01-10 MED ORDER — PREDNISONE 5 MG PO TABS
10.0000 mg | ORAL_TABLET | ORAL | Status: DC
  Administered 2017-01-12 – 2017-01-22 (×6): 10 mg via ORAL
  Filled 2017-01-10 (×6): qty 2

## 2017-01-10 MED ORDER — FLEET ENEMA 7-19 GM/118ML RE ENEM: 1.0000 | ENEMA | Freq: Once | RECTAL | Status: DC | PRN

## 2017-01-10 MED ORDER — FENTANYL CITRATE (PF) 100 MCG/2ML IJ SOLN
INTRAMUSCULAR | Status: AC
  Filled 2017-01-10: qty 2

## 2017-01-10 MED ORDER — IOPAMIDOL (ISOVUE-300) INJECTION 61%: 45.0000 mL | Freq: Once | INTRAVENOUS | Status: DC | PRN

## 2017-01-10 MED ORDER — LIDOCAINE HCL (PF) 1 % IJ SOLN
INTRAMUSCULAR | Status: AC | PRN
  Administered 2017-01-10: 5 mL

## 2017-01-10 MED ORDER — LIDOCAINE HCL 1 % IJ SOLN
INTRAMUSCULAR | Status: AC
  Filled 2017-01-10: qty 20

## 2017-01-10 MED ORDER — IPRATROPIUM-ALBUTEROL 0.5-2.5 (3) MG/3ML IN SOLN: 3.0000 mL | Freq: Three times a day (TID) | RESPIRATORY_TRACT | Status: DC

## 2017-01-10 MED ORDER — ACETAMINOPHEN 325 MG PO TABS
325.0000 mg | ORAL_TABLET | ORAL | Status: DC | PRN
  Filled 2017-01-10 (×2): qty 2

## 2017-01-10 MED ORDER — LEVETIRACETAM 500 MG PO TABS: 500.0000 mg | ORAL_TABLET | Freq: Two times a day (BID) | ORAL | Status: AC

## 2017-01-10 MED ORDER — BISACODYL 10 MG RE SUPP
10.0000 mg | Freq: Every day | RECTAL | Status: DC | PRN
  Administered 2017-01-11 – 2017-01-24 (×2): 10 mg via RECTAL
  Filled 2017-01-10 (×2): qty 1

## 2017-01-10 NOTE — Discharge Summary (Signed)
Physician Discharge Summary  Tracy Bridges ZGY:[id number] DOB: [birth year] DOA: 01/09/2017  PCP: Sherrilee Gilles, DO  Admit date: 01/09/2017 Discharge date: 01/10/2017   Recommendations for Outpatient Follow-Up:  Per IR: Ok to shower 10/9 Do not submerge for 7 days Routine wound care  MRSA treatment x 5 days  outpatient follow-up with neuro-oncology and medical oncology and neurosurgery  Discharge Diagnosis:   Principal Problem:   DVT (deep venous thrombosis) ([place]) Active Problems:   GERD (gastroesophageal reflux disease)   Atrial fibrillation (H[facility])   Emphysema/COPD (HCC)   Paresthesia of right arm   CKD (chronic kidney disease), stage II   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  Diet recommendation: soft  Wound care: None.   History of Present Illness:   Tracy Bridges is a 76 y.o. female  with past medical history significant for brain tumor with resection and atrial fibrillation not on anticoagulation presents for procedure. Patient had been discharged to inpatient rehabilitation after resection of a brain tumor. Patient was doing well. Developed lower leg pain and swelling. Vascular ultrasound showed DVT. Patient agreeable to Greenfield filter placement. PM&R provider spoke to interventional radiologist who advised transfer back to acute care setting and procedure to take place next day.   Hospital Course by Problem:   S/p filter for DVT by IR -Ok to shower tomorrow - Do not submerge for 7 days - Routine wound care  Reflux Cont PPI  Atrial fibrillation Off Eliquis, continue diltiazem  Metastatic lung cancer Status post cranial tumor resection 9/28 Defer to neurosurgery about staple removal  Continue Keppra for seizure prophylaxis Will need outpatient follow-up with neuro-oncology and medical oncology and neurosurgery  COPD When necessary albuterol TID duoneb  CKD II Cr at baseline,  <1.0  Seizures  hx Prn ativan Seizure precautions  OSA O2 at night  CAD Plavix   Medical Consultants:   IR  Discharge Exam:   Vitals:   01/10/17 0953 01/10/17 1313  BP: (!) 117/51 132/68  Pulse: (!) 54 65  Resp: 14 16  Temp:  98.6 F (37 C)  SpO2: 94% 100%   Vitals:   01/10/17 0939 01/10/17 0947 01/10/17 0953 01/10/17 1313  BP: (!) 129/47 (!) 128/50 (!) 117/51 132/68  Pulse: 64 (!) 54 (!) 54 65  Resp: (!) _0 Temp:    98.6 F (37 C)  TempSrc:    Oral  SpO2: 95% 98% 94% 100%  Weight:      Height:          The results of significant diagnostics from this hospitalization (including imaging, microbiology, ancillary and laboratory) are listed below for reference.     Procedures and Diagnostic Studies:   Ir Ivc Filter Plmt / S&i /img Guid/mod Sed  Result Date: 01/10/2017 INDICATION: 76 year old female with history of DVT. 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 ANESTHESIA/SEDATION: 1.0 mg IV Versed; 50 mcg IV Fentanyl Moderate Sedation Time:  25 minutes The patient was continuously monitored during the procedure by the interventional radiology nurse under my direct supervision. FLUOROSCOPY TIME:  Fluoroscopy Time: 3 minutes 54 seconds (21.4 mGy). COMPLICATIONS: None PROCEDURE: The procedure, risks, benefits, and alternatives were explained to the patient. Specific risks discussed include bleeding, infection, contrast reaction, renal failure, IVC filter fracture, migration, ileo caval thrombus (3% incidence), need for further procedure, need for further  surgery, pulmonary embolism, cardiopulmonary collapse, death. Questions regarding the procedure were encouraged and answered. The patient understands and consents to the procedure. Ultrasound survey was performed with images stored and sent to PACs. The neck was prepped with Betadine in a sterile  fashion, and a sterile drape was applied covering the operative field. A sterile gown and sterile gloves were used for the procedure. Local anesthesia was provided with 1% Lidocaine. A micropuncture needle was used access the right internal jugular vein under ultrasound. With excellent venous blood flow returned, and an .018 micro wire was passed through the needle. Small incision was made with an 11 blade scalpel. The needle was removed, and a micropuncture sheath was placed over the wire. The inner dilator and wire were removed, and an 035 Bentson wire was advanced under fluoroscopy into the IVC. Serial dilation of the soft tissue tract was performed with an 8 [place] dilator and subsequently a 10 [place] dilator. The delivery sheath for a retrievable Bard Denali filter was passed over the Bentson wire into the IVC. The wire was removed and small contrast was used to confirm IVC location. IVC cavagram performed. Dilator was removed, and the IVC filter was then delivered, with proximal migration such that the wall contact encroached upon the inflow from the right renal vein. A Bard retrieval kit was then passed over the guidewire for repositioning. The cap of the filter was easily captured with the gooseneck snare, and the filter was repositioned to a slightly lower position at the L3 level. Final position below the lowest renal vein. Repeat cavagram performed, and the catheter/wire/sheath was removed. Manual pressure was used for hemostasis. Patient tolerated the procedure well and remained hemodynamically stable throughout. No complications were encountered and no significant blood loss was encounter. IMPRESSION: Status post IVC filter placement via right IJ approach. Signed, Dulcy Fanny. Earleen Newport DO [facility] Radiology PLAN: This IVC filter is potentially retrievable. The patient can be assessed for filter retrieval by Interventional Radiology in approximately 8-12  weeks, or if the patient is a candidate for anti coagulation. Further recommendations regarding filter retrieval, continued surveillance or declaration of device permanence, will be made at that time. Electronically Signed   By: Corrie Mckusick D.O.   On: 01/10/2017 10:18   Ir Ivc Filter Reposition / S&i /img Guid/mod Sed  Result Date: 01/10/2017 INDICATION: 76 year old female with history of DVT. 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 ANESTHESIA/SEDATION: 1.0 mg IV Versed; 50 mcg IV Fentanyl Moderate Sedation Time:  25 minutes The patient was continuously monitored during the procedure by the interventional radiology nurse under my direct supervision. FLUOROSCOPY TIME:  Fluoroscopy Time: 3 minutes 54 seconds (21.4 mGy). COMPLICATIONS: None PROCEDURE: The procedure, risks, benefits, and alternatives were explained to the patient. Specific risks discussed include bleeding, infection, contrast reaction, renal failure, IVC filter fracture, migration, ileo caval thrombus (3% incidence), need for further procedure, need for further surgery, pulmonary embolism, cardiopulmonary collapse, death. Questions regarding the procedure were encouraged and answered. The patient understands and consents to the procedure. Ultrasound survey was performed with images stored and sent to PACs. The neck was prepped with Betadine in a sterile fashion, and a sterile drape was applied covering the operative field. A sterile gown and sterile gloves were used for the procedure. Local anesthesia was provided with 1% Lidocaine. A micropuncture needle was used access the right internal jugular  vein under ultrasound. With excellent venous blood flow returned, and an .018 micro wire was passed through the needle. Small incision was made with an 11 blade scalpel. The needle was removed, and a micropuncture  sheath was placed over the wire. The inner dilator and wire were removed, and an 035 Bentson wire was advanced under fluoroscopy into the IVC. Serial dilation of the soft tissue tract was performed with an 8 [place] dilator and subsequently a 10 [place] dilator. The delivery sheath for a retrievable Bard Denali filter was passed over the Bentson wire into the IVC. The wire was removed and small contrast was used to confirm IVC location. IVC cavagram performed. Dilator was removed, and the IVC filter was then delivered, with proximal migration such that the wall contact encroached upon the inflow from the right renal vein. A Bard retrieval kit was then passed over the guidewire for repositioning. The cap of the filter was easily captured with the gooseneck snare, and the filter was repositioned to a slightly lower position at the L3 level. Final position below the lowest renal vein. Repeat cavagram performed, and the catheter/wire/sheath was removed. Manual pressure was used for hemostasis. Patient tolerated the procedure well and remained hemodynamically stable throughout. No complications were encountered and no significant blood loss was encounter. IMPRESSION: Status post IVC filter placement via right IJ approach. Signed, Dulcy Fanny. Earleen Newport DO [facility] Radiology PLAN: This IVC filter is potentially retrievable. The patient can be assessed for filter retrieval by Interventional Radiology in approximately 8-12 weeks, or if the patient is a candidate for anti coagulation. Further recommendations regarding filter retrieval, continued surveillance or declaration of device permanence, will be made at that time. Electronically Signed   By: Corrie Mckusick D.O.   On: 01/10/2017 10:18     Labs:   Basic Metabolic Panel:  Recent Labs Lab 01/08/17 0943 01/09/17 2009 01/10/17 0518  NA 136 136 139  K 4.7 4.4 4.5  CL 104 104 106  CO2 21* 21* 26  GLUCOSE 122* 116* 96   BUN 34* 36* 33*  CREATININE 0.97 0.96 0.94  CALCIUM 9.3 9.0 8.8*   GFR Estimated Creatinine Clearance: 54.9 mL/min (by C-G formula based on SCr of 0.94 mg/dL). Liver Function Tests:  Recent Labs Lab 01/08/17 0943  AST 24  ALT 21  ALKPHOS 49  BILITOT 0.8  PROT 5.7*  ALBUMIN 3.2*   No results for input(s): LIPASE, AMYLASE in the last 168 hours. No results for input(s): AMMONIA in the last 168 hours. Coagulation profile  Recent Labs Lab 01/10/17 0518  INR 1.07    CBC:  Recent Labs Lab 01/08/17 0943 01/10/17 0518  WBC 22.1* 20.3*  NEUTROABS 20.5*  --   HGB 12.2 10.4*  HCT 37.2 32.0*  MCV 91.2 91.2  PLT 111* 114*   Cardiac Enzymes: No results for input(s): CKTOTAL, CKMB, CKMBINDEX, TROPONINI in the last 168 hours. BNP: Invalid input(s): POCBNP CBG: No results for input(s): GLUCAP in the last 168 hours. D-Dimer No results for input(s): DDIMER in the last 72 hours. Hgb A1c No results for input(s): HGBA1C in the last 72 hours. Lipid Profile No results for input(s): CHOL, HDL, LDLCALC, TRIG, CHOLHDL, LDLDIRECT in the last 72 hours. Thyroid function studies No results for input(s): TSH, T4TOTAL, T3FREE, THYROIDAB in the last 72 hours.  Invalid input(s): FREET3 Anemia work up No results for input(s): VITAMINB12, FOLATE, FERRITIN, TIBC, IRON, RETICCTPCT in the last 72 hours. Microbiology Recent Results (from the  past 240 hour(s))  Culture, Urine     Status: Abnormal   Collection Time: 01/01/17  9:55 AM  Result Value Ref Range Status   Specimen Description URINE, CLEAN CATCH  Final   Special Requests NONE  Final   Culture MULTIPLE SPECIES PRESENT, SUGGEST RECOLLECTION (A)  Final   Report Status 01/03/2017 FINAL  Final  Urine culture     Status: Abnormal   Collection Time: 01/08/17  7:17 AM  Result Value Ref Range Status   Specimen Description URINE, RANDOM  Final   Special Requests NONE  Final   Culture MULTIPLE SPECIES PRESENT, SUGGEST RECOLLECTION (A)   Final   Report Status 01/09/2017 FINAL  Final  Surgical pcr screen     Status: Abnormal   Collection Time: 01/10/17  7:25 AM  Result Value Ref Range Status   MRSA, PCR POSITIVE (A) NEGATIVE Final   Staphylococcus aureus POSITIVE (A) NEGATIVE Final    Comment: RESULT CALLED TO, READ BACK BY AND VERIFIED WITH: RN R WELCH [phone] MLM (NOTE) The Xpert SA Assay (FDA approved for NASAL specimens in patients 37 years of age and older), is one component of a comprehensive surveillance program. It is not intended to diagnose infection nor to guide or monitor treatment.      Discharge Instructions:   Discharge Instructions    Increase activity slowly    Complete by:  As directed      Allergies as of 01/10/2017      Reactions   Aspirin Shortness Of Breath   Atenolol Swelling   Throat swelled and closed up   Boniva [ibandronic Acid] Other (See Comments)   Pt does not remember specific reaction   Fluticasone Anaphylaxis, Hives   Fosamax [alendronate Sodium] Other (See Comments)   Pt does not remember specific reaction   Latex Swelling, Rash   Throat swelled and closed up   Mucinex [guaifenesin Er] Anaphylaxis   Rocephin [ceftriaxone Sodium In Dextrose] Anaphylaxis, Rash   Rotateq [rotavirus Vaccine Live Oral] Other (See Comments)   "poorly tolerated"   Sulfa Antibiotics Other (See Comments)   Pt does not remember specific reaction   Vancomycin Other (See Comments)   Pt does not remember specific reaction   Penicillins Rash   Childhood reaction Has patient had a PCN reaction causing immediate rash, facial/tongue/throat swelling, SOB or lightheadedness with hypotension: Yes Has patient had a PCN reaction causing severe rash involving mucus membranes or skin necrosis: No Has patient had a PCN reaction that required hospitalization: Unknown Has patient had a PCN reaction occurring within the last 10 years: No If all of the above answers are "NO", then may proceed with  Cephalosporin use.   Tobramycin Sulfate Rash      Medication List    STOP taking these medications   ciprofloxacin 500 MG tablet Commonly known as:  CIPRO     TAKE these medications   acetaminophen 500 MG tablet Commonly known as:  TYLENOL Take 1,000 mg by mouth every 6 (six) hours as needed for headache (pain).   CALCIUM-D PO Take 1 tablet by mouth at bedtime.   Chlorhexidine Gluconate Cloth 2 % Pads Apply 6 each topically daily at 6 (six) AM.   clopidogrel 75 MG tablet Commonly known as:  PLAVIX Take 75 mg by mouth at bedtime.   diltiazem 240 MG 24 hr tablet Commonly known as:  CARDIZEM LA Take 1 tablet (240 mg total) by mouth daily. What changed:  when to take this  EPIPEN 0.3 mg/0.3 mL Soaj injection Generic drug:  EPINEPHrine Inject 0.3 mg into the muscle once as needed (severe allergic reaction).   furosemide 20 MG tablet Commonly known as:  LASIX Take 20 mg by mouth daily as needed for fluid or edema.   HYDROcodone-homatropine 5-1.5 MG/5ML syrup Commonly known as:  HYCODAN Take 5 mLs by mouth at bedtime as needed for cough.   ipratropium-albuterol 0.5-2.5 (3) MG/3ML Soln Commonly known as:  DUONEB Take 3 mLs by nebulization 3 (three) times daily.   levETIRAcetam 500 MG tablet Commonly known as:  KEPPRA Take 1 tablet (500 mg total) by mouth 2 (two) times daily.   multivitamin with minerals Tabs tablet Take 1 tablet by mouth daily.   mupirocin ointment 2 % Commonly known as:  BACTROBAN Place 1 application into the nose 2 (two) times daily.   omeprazole 20 MG capsule Commonly known as:  PRILOSEC Take 20 mg by mouth 2 (two) times daily.   OXYGEN Inhale 2 L into the lungs at bedtime.   predniSONE 10 MG tablet Commonly known as:  DELTASONE Take 10 mg by mouth every other day.   PRESCRIPTION MEDICATION Inject into the muscle See admin instructions. 2 allergy shots weekly by Dr. Fransico Michael, [place], [place]   [facility] HFA 108 [phone] Base) MCG/ACT  inhaler Generic drug:  albuterol Inhale 1-2 puffs into the lungs every 4 (four) hours as needed for wheezing or shortness of breath.   SEREVENT DISKUS 50 MCG/DOSE diskus inhaler Generic drug:  salmeterol Inhale 1 puff into the lungs 2 (two) times daily as needed (shortness of breath).   Vitamin D (Ergocalciferol) 50000 units Caps capsule Commonly known as:  DRISDOL Take 50,000 Units by mouth See admin instructions. Take 1 capsule (50,000 units) by mouth every other Saturday         Time coordinating discharge: 35 min  Signed:  [facility] 01/10/2017, 1:19 PM

## 2017-01-10 NOTE — Sedation Documentation (Signed)
Patient is resting comfortably. 

## 2017-01-10 NOTE — Procedures (Signed)
Interventional Radiology Procedure Note  Procedure: Placement of a right IJ approach IVC filter.  Repositioning to location below renal vein inflow.  Complications: None Recommendations:  - Ok to shower tomorrow - Do not submerge for 7 days - Routine wound care - If patient is candidate for removal, please refer to [place] clinic for evaluation and discussion for retrieval.    Signed,  Dulcy Fanny. Earleen Newport, DO

## 2017-01-10 NOTE — Progress Notes (Addendum)
Rehab admissions:  Patient was admitted to inpatient rehabilitation on 01/07/17 after resection of a brain tumor . Patient was doing well. She developed lower leg pain and swelling. Vascular ultrasound showed DVT. Patient agreeable to Greenfield filter placement. PM&R provider spoke to interventional radiologist who advised transfer back to acute care setting and procedure to take place next day.  Patient was transferred off CIR and admitted to acute hospital at 1707 on 01/09/17. She underwent filter placement earlier today, 01/10/17.  She is now stable from procedure.  She continues to meet criteria for acute inpatient rehab stay.  We will re-admit to inpatient rehab today.  This is an interrupted stay for inpatient rehab admission.  I have discussed the above with patient and her husband and they are in agreement to re-admit to CIR.  Bed available today.  Call me for questions.  #[phone]

## 2017-01-10 NOTE — Care Management Obs Status (Signed)
[place] NOTIFICATION   Patient Details  Name: Tracy Bridges MRN: [id number] Date of Birth: 29-Mar-1941   Medicare Observation Status Notification Given:  Yes    Sharin Mons, RN 01/10/2017, 3:31 PM

## 2017-01-10 NOTE — Progress Notes (Signed)
Patient information reviewed and entered into eRehab system by Oleta Gunnoe, RN, CRRN, PPS Coordinator.  Information including medical coding and functional independence measure will be reviewed and updated through discharge.     Per nursing patient was given "Data Collection Information Summary for Patients in Inpatient Rehabilitation Facilities with attached "Privacy Act Statement-Health Care Records" upon admission.  

## 2017-01-10 NOTE — Progress Notes (Signed)
Patient ID: Tracy Bridges, female   DOB: [birth year], 76 y.o.   MRN: [id number] Patient and family arrived from [phone] with RN and patient belongings. Patient and family reoriented to rehab process and schedule. Patient had an uninterrupted stay and has now returned to rehab.

## 2017-01-10 NOTE — Progress Notes (Signed)
[facility]     PROGRESS NOTE  Updated HPI: Tracy S Jonesis a 76 y.o.femalewith history of A fib, emphysema, bronchiectasis?--uses percussion vest and nebs tid, lung cancer who was admitted on 12/26/16 with abnormalRUE motor activity. Patient with 6 week history of progressive right sided weakness with foot drop, headaches and nausea but unable to tolerate MRI for work up. Head CT done revealing She was as well as large ovoid mass in high left frontal lobe with vasogenic edema. 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 She was started on Keppra as well as steroids. Eliquis placed on hold and patient transitioned to IV heparin. CT abdomen/pelvis done showing no evidence of metastatic disease and incidental findings of left thyroid nodule and moderate size HH. She underwent craniotomy for tumor resection on 12/31/16 by Dr. Christella Noa. Pathology revealed metastatic lung cancerand Dr. Alen Blew consulted for input. Heme/Onc input pending. Patient with resultant right hemiplegia, lethargy, as well as deficits in processing and following commands. CIR recommended due to deficits in mobility and ability to carry out ADL tasks. She was admitted to CIR on 01/07/17 after resection of the brain tumor. She was doing well. She developed lower leg pain and swelling. Vascular ultrasound showed DVT. Patient underwent [place] filter placement. Per report, VIR advised transfer back to acute care setting for IVC filter. Patient was transferred off CIR and admitted to acute hospital at 1707 on 01/09/17. She underwent filter placement 01/10/17.  She is now stable from procedure. She continues to meet criteria for acute inpatient rehab stay.  We will re-admit to inpatient rehab today.  This is an interrupted stay for inpatient rehab admission.    ROS:  Constitutional: Negative for  chillsand fever.  HENT: Negative for hearing lossand tinnitus.  Eyes: Negative for blurred visionand double vision.  Respiratory: Negative for cough, shortness of breathand wheezing.  Cardiovascular: Negative for chest painand palpitations.  Gastrointestinal: Positive for constipation, nauseaand vomiting. Negative for abdominal pain.  Genitourinary: Negative for dysuriaand urgency.  Musculoskeletal: Negative for back painand myalgias.  Skin: Negative for itchingand rash.  Neurological: Positive for dizziness, sensory change, focal weakness, weaknessand headaches.  Psychiatric/Behavioral: The patient has insomnia(due to pain/nausea).  All other systems reviewed and negative  Objective: Vital Signs: Blood pressure 132/68, pulse 65, temperature 98.6 F (37 C), temperature source Oral, resp. rate 16, height _0  (1.676 m), weight 81.8 kg (180 lb 6.4 oz), SpO2 100 %. Ir Ivc Filter Plmt / S&i /img Guid/mod Sed  Result Date: 01/10/2017 INDICATION: 76 year old female with history of DVT. 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 ANESTHESIA/SEDATION: 1.0 mg IV Versed; 50 mcg IV Fentanyl Moderate Sedation Time:  25 minutes The patient was continuously monitored during the procedure by the interventional radiology nurse under my direct supervision. FLUOROSCOPY TIME:  Fluoroscopy Time: 3 minutes 54 seconds (21.4 mGy). COMPLICATIONS: None PROCEDURE: The procedure, risks, benefits, and alternatives were explained to the patient. Specific risks discussed include bleeding, infection, contrast reaction, renal failure, IVC filter fracture, migration, ileo caval thrombus (3% incidence), need for further procedure, need for further surgery, pulmonary embolism, cardiopulmonary collapse, death. Questions regarding the procedure were encouraged and answered. The patient  understands and consents to the procedure. Ultrasound survey was performed with images stored and sent to PACs. The neck  was prepped with Betadine in a sterile fashion, and a sterile drape was applied covering the operative field. A sterile gown and sterile gloves were used for the procedure. Local anesthesia was provided with 1% Lidocaine. A micropuncture needle was used access the right internal jugular vein under ultrasound. With excellent venous blood flow returned, and an .018 micro wire was passed through the needle. Small incision was made with an 11 blade scalpel. The needle was removed, and a micropuncture sheath was placed over the wire. The inner dilator and wire were removed, and an 035 Bentson wire was advanced under fluoroscopy into the IVC. Serial dilation of the soft tissue tract was performed with an 8 [place] dilator and subsequently a 10 [place] dilator. The delivery sheath for a retrievable Bard Denali filter was passed over the Bentson wire into the IVC. The wire was removed and small contrast was used to confirm IVC location. IVC cavagram performed. Dilator was removed, and the IVC filter was then delivered, with proximal migration such that the wall contact encroached upon the inflow from the right renal vein. A Bard retrieval kit was then passed over the guidewire for repositioning. The cap of the filter was easily captured with the gooseneck snare, and the filter was repositioned to a slightly lower position at the L3 level. Final position below the lowest renal vein. Repeat cavagram performed, and the catheter/wire/sheath was removed. Manual pressure was used for hemostasis. Patient tolerated the procedure well and remained hemodynamically stable throughout. No complications were encountered and no significant blood loss was encounter. IMPRESSION: Status post IVC filter placement via right IJ approach. Signed, Dulcy Fanny. Earleen Newport DO [facility]  Radiology PLAN: This IVC filter is potentially retrievable. The patient can be assessed for filter retrieval by Interventional Radiology in approximately 8-12 weeks, or if the patient is a candidate for anti coagulation. Further recommendations regarding filter retrieval, continued surveillance or declaration of device permanence, will be made at that time. Electronically Signed   By: Corrie Mckusick D.O.   On: 01/10/2017 10:18   Ir Ivc Filter Reposition / S&i /img Guid/mod Sed  Result Date: 01/10/2017 INDICATION: 76 year old female with history of DVT. 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 ANESTHESIA/SEDATION: 1.0 mg IV Versed; 50 mcg IV Fentanyl Moderate Sedation Time:  25 minutes The patient was continuously monitored during the procedure by the interventional radiology nurse under my direct supervision. FLUOROSCOPY TIME:  Fluoroscopy Time: 3 minutes 54 seconds (21.4 mGy). COMPLICATIONS: None PROCEDURE: The procedure, risks, benefits, and alternatives were explained to the patient. Specific risks discussed include bleeding, infection, contrast reaction, renal failure, IVC filter fracture, migration, ileo caval thrombus (3% incidence), need for further procedure, need for further surgery, pulmonary embolism, cardiopulmonary collapse, death. Questions regarding the procedure were encouraged and answered. The patient understands and consents to the procedure. Ultrasound survey was performed with images stored and sent to PACs. The neck was prepped with Betadine in a sterile fashion, and a sterile drape was applied covering the operative field. A sterile gown and sterile gloves were used for the procedure. Local anesthesia was provided with 1% Lidocaine. A micropuncture needle was used access the right internal jugular vein under ultrasound. With excellent venous blood flow  returned, and an .018 micro wire was passed through the needle. Small incision was made with an 11 blade scalpel. The needle was removed, and a micropuncture  sheath was placed over the wire. The inner dilator and wire were removed, and an 035 Bentson wire was advanced under fluoroscopy into the IVC. Serial dilation of the soft tissue tract was performed with an 8 [place] dilator and subsequently a 10 [place] dilator. The delivery sheath for a retrievable Bard Denali filter was passed over the Bentson wire into the IVC. The wire was removed and small contrast was used to confirm IVC location. IVC cavagram performed. Dilator was removed, and the IVC filter was then delivered, with proximal migration such that the wall contact encroached upon the inflow from the right renal vein. A Bard retrieval kit was then passed over the guidewire for repositioning. The cap of the filter was easily captured with the gooseneck snare, and the filter was repositioned to a slightly lower position at the L3 level. Final position below the lowest renal vein. Repeat cavagram performed, and the catheter/wire/sheath was removed. Manual pressure was used for hemostasis. Patient tolerated the procedure well and remained hemodynamically stable throughout. No complications were encountered and no significant blood loss was encounter. IMPRESSION: Status post IVC filter placement via right IJ approach. Signed, Dulcy Fanny. Earleen Newport DO [facility] Radiology PLAN: This IVC filter is potentially retrievable. The patient can be assessed for filter retrieval by Interventional Radiology in approximately 8-12 weeks, or if the patient is a candidate for anti coagulation. Further recommendations regarding filter retrieval, continued surveillance or declaration of device permanence, will be made at that time. Electronically Signed   By: Corrie Mckusick D.O.   On: 01/10/2017 10:18    Recent Labs  01/08/17 0943  01/10/17 0518  WBC 22.1* 20.3*  HGB 12.2 10.4*  HCT 37.2 32.0*  PLT 111* 114*    Recent Labs  01/09/17 2009 01/10/17 0518  NA 136 139  K 4.4 4.5  CL 104 106  GLUCOSE 116* 96  BUN 36* 33*  CREATININE 0.96 0.94  CALCIUM 9.0 8.8*   CBG (last 3)  No results for input(s): GLUCAP in the last 72 hours.  Wt Readings from Last 3 Encounters:  01/10/17 81.8 kg (180 lb 6.4 oz)  01/09/17 77.7 kg (171 lb 3.2 oz)  01/06/17 86.2 kg (190 lb 1.6 oz)    Physical Exam:  BP 132/68 (BP Location: Right Arm)   Pulse 65   Temp 98.6 F (37 C) (Oral)   Resp 16   Ht _0  (1.676 m)   Wt 81.8 kg (180 lb 6.4 oz)   SpO2 100%   BMI 29.12 kg/m  Physical Exam Nursing noteand vitalsreviewed. Constitutional: She is oriented to person, place, and time. She appears well-developedand well-nourished. No distress.  HENT:  Head: Normocephalicand atraumatic.  Eyes: EOMI. No discharge.  Neck: Normal range of motion. Neck supple.  Cardiovascular: Normal rateand regular rhythm. No JVD. No murmurheard. Respiratory: Effort normaland breath sounds normal. No stridor. No respiratory distress. She has no wheezes.  GI: Soft. Bowel sounds are normal. She exhibits no distension. There is no tenderness.  Musculoskeletal: She exhibits edema. She exhibits no tenderness.  Neurological: She is alertand oriented to person, place, and time.  Speech clear.  Able to follow simple motor command without difficulty.  RUE 3+/5 deltoid, biceps, triceps, wrist, HI.  RLE: 3+/5 HF, KE, 0/5 ADF/PF.  Skin: Skin is warmand dry. She is not diaphoretic.  Psychiatric: She has a normal mood and affect. Her behavior is normal. Thought contentnormal.    Assessment/Plan: 1. Functional deficits secondary to metastatic lung  cancer to the brain which require 3+ hours per day of interdisciplinary therapy in a comprehensive inpatient rehab setting. Physiatrist is providing close team supervision and 24 hour management of active  medical problems listed below. Physiatrist and rehab team continue to assess barriers to discharge/monitor patient progress toward functional and medical goals.  Function:  Bathing Bathing position      Bathing parts      Bathing assist        Upper Body Dressing/Undressing Upper body dressing                    Upper body assist        Lower Body Dressing/Undressing Lower body dressing                                  Lower body assist        Toileting Toileting          Toileting assist     Transfers Chair/bed transfer             Locomotion Ambulation           Wheelchair          Cognition Comprehension    Expression    Social Interaction    Problem Solving    Memory        Medical Problem List and Plan: 1. Functional deficits and right hemiparesissecondary to metastatic lung cancer to the brain. -Resume CIR 2. DVT anticoagulation: Mechanical: Sequential compression devices, below knee Bilateral lower extremities, IVC filter 3. Headaches/Pain Management: Headaches persistent and being managed with prn hydocodone.  4. Mood: LCSW to follow for evaluation and support.  5. Neuropsych: This patient iscapable of making decisions on herown behalf. 6. Skin/Wound Care: routine pressure relief measures. Cont diet- GI issues may be due to steroids and poor intake of liquid diet.  7. Fluids/Electrolytes/Nutrition: Monitor I/O. Check lytes in am.  8. Nausea: Likely central. Continue full liquid diet. Zofran not effective--continue promethizine prn.  9. H/o A Fib: Managed with diltiazem. Monitor HR bid.  10. Emphysema/Bronchiectasis: continue nebulizers. Will resumechest PT bid as at home. Continue oxygen at nights and prn during the day.  11. Leucocytosis: Monitor for signs of infection.   LOS (Days) 1 A FACE TO FACE EVALUATION WAS PERFORMED  Maddix Heinz [name] 01/10/2017 2:09 PM

## 2017-01-10 NOTE — Evaluation (Signed)
Physical Therapy Evaluation Patient Details Name: Tracy Bridges MRN: [id number] DOB: 10-Feb-1941 Today's Date: 01/10/2017   History of Present Illness  Pt is a 76 y/o female presenting from CIR secondary to LLE DVT. Pt underwent IVC filter placement on 10/8. Pt previously presented to hospital on 9/23 for R sided weakness where a L frontal mass was found. Pt underwent tumor resection on 9/28, and was d/c'd to CIR. PMH includes a fib, COPD, CKD, seiqures, and R upper lobe cancer s/p resection.   Clinical Impression  Pt is s/p procedure above with deficits below. PTA, pt was at [facility] for short period and reports she was able to take a few steps with assistance. Required assist with ADLs as well. Upon eval, pt with weakness in RLE, decreased balance, and faituge. Pt requiring mod A for bed mobility and to stand at EOB. Recommending return to CIR at d/c to increase independence and safety with functional mobility. Will continue to follow acutely to maximize functional mobility independence and safety.     Follow Up Recommendations CIR    Equipment Recommendations  None recommended by PT    Recommendations for Other Services       Precautions / Restrictions Precautions Precautions: Fall Restrictions Weight Bearing Restrictions: No      Mobility  Bed Mobility Overal bed mobility: Needs Assistance Bed Mobility: Supine to Sit;Sit to Supine     Supine to sit: Mod assist Sit to supine: Mod assist   General bed mobility comments: Mod A for trunk elevation and for assist with moving RLE during bed mobility. Assist to scoot R hip forward using bed pad as well.  Mod A for return to supine as pt reliant on PT to assist with RLE lift assist back to bed.   Transfers Overall transfer level: Needs assistance Equipment used: 1 person hand held assist Transfers: Sit to/from Stand Sit to Stand: Mod assist         General transfer comment: Heavy mod A to perform sit<>stand X 2 this session.  Required manual blocking assist to prevent R knee buckling. Verbal cues for safe hand placement. Pt easily fatigued and requesting to lay down following second stand.   Ambulation/Gait             General Gait Details: NT secondary to fatigue   Stairs            Wheelchair Mobility    Modified Rankin (Stroke Patients Only)       Balance Overall balance assessment: Needs assistance Sitting-balance support: Feet supported;Single extremity supported Sitting balance-Leahy Scale: Fair     Standing balance support: Bilateral upper extremity supported Standing balance-Leahy Scale: Poor Standing balance comment: Reliant on BUE support and external support for standing                              Pertinent Vitals/Pain Pain Assessment: 0-10 Pain Score: 5  Pain Location: At incision site  Pain Descriptors / Indicators: Discomfort Pain Intervention(s): Limited activity within patient's tolerance;Monitored during session;Repositioned    Home Living Family/patient expects to be discharged to:: Inpatient rehab                      Prior Function Level of Independence: Needs assistance   Gait / Transfers Assistance Needed: Needed help with transfers. Only able to stand with help, and only able to take a few steps with help.   ADL's /  Homemaking Assistance Needed: Needed help with bathing and dressing from staff.         Hand Dominance   Dominant Hand: Right    Extremity/Trunk Assessment   Upper Extremity Assessment Upper Extremity Assessment: Defer to OT evaluation    Lower Extremity Assessment Lower Extremity Assessment: RLE deficits/detail RLE Deficits / Details: DF, PF, knee extension 1/5; functional weakness noted and required manual blocking of R knee.  RLE Sensation: decreased light touch RLE Coordination: decreased gross motor    Cervical / Trunk Assessment Cervical / Trunk Assessment: Kyphotic  Communication   Communication: No  difficulties  Cognition Arousal/Alertness: Awake/alert Behavior During Therapy: WFL for tasks assessed/performed Overall Cognitive Status: Within Functional Limits for tasks assessed Area of Impairment: Problem solving;Following commands                       Following Commands: Follows one step commands consistently;Follows one step commands with increased time     Problem Solving: Slow processing;Requires verbal cues General Comments: Required tactile cues for safe transfer technique. Showed slowed processing as well.       General Comments General comments (skin integrity, edema, etc.): Pt's husband present during session. Educated about return to CIR to increase independence and safety with mobility and pt and family agreeable.     Exercises     Assessment/Plan    PT Assessment Patient needs continued PT services  PT Problem List Decreased strength;Decreased range of motion;Decreased activity tolerance;Decreased balance;Decreased mobility;Decreased cognition;Decreased knowledge of use of DME;Decreased safety awareness;Impaired sensation;Decreased coordination;Pain       PT Treatment Interventions DME instruction;Gait training;Functional mobility training;Balance training;Neuromuscular re-education;Patient/family education;Stair training;Therapeutic activities;Wheelchair mobility training    PT Goals (Current goals can be found in the Care Plan section)  Acute Rehab PT Goals Patient Stated Goal: to go home when I am stronger  PT Goal Formulation: With patient Time For Goal Achievement: 01/24/17 Potential to Achieve Goals: Good    Frequency Min 3X/week   Barriers to discharge        Co-evaluation               AM-PAC PT "6 Clicks" Daily Activity  Outcome Measure Difficulty turning over in bed (including adjusting bedclothes, sheets and blankets)?: A Lot Difficulty moving from lying on back to sitting on the side of the bed? : Unable Difficulty sitting  down on and standing up from a chair with arms (e.g., wheelchair, bedside commode, etc,.)?: Unable Help needed moving to and from a bed to chair (including a wheelchair)?: A Lot Help needed walking in hospital room?: Total Help needed climbing 3-5 steps with a railing? : Total 6 Click Score: 8    End of Session Equipment Utilized During Treatment: Gait belt Activity Tolerance: Patient limited by fatigue;Patient limited by lethargy Patient left: in bed;with call bell/phone within reach Nurse Communication: Mobility status PT Visit Diagnosis: Muscle weakness (generalized) (M62.81);Difficulty in walking, not elsewhere classified (R26.2);Hemiplegia and hemiparesis Hemiplegia - Right/Left: Right Hemiplegia - dominant/non-dominant: Dominant Hemiplegia - caused by:  (frontal lobe mass )    Time: 4656-8127 PT Time Calculation (min) (ACUTE ONLY): 27 min   Charges:   PT Evaluation $PT Eval Moderate Complexity: 1 Mod PT Treatments $Therapeutic Activity: 8-22 mins   PT G Codes:   PT G-Codes **NOT FOR INPATIENT CLASS** Functional Assessment Tool Used: AM-PAC 6 Clicks Basic Mobility;Clinical judgement Functional Limitation: Mobility: Walking and moving around Mobility: Walking and Moving Around Current Status (N1700): At least 80 percent  but less than 100 percent impaired, limited or restricted Mobility: Walking and Moving Around Goal Status [phone]): At least 60 percent but less than 80 percent impaired, limited or restricted    Leighton Ruff, PT, DPT  Acute Rehabilitation Services  Pager: [place] 01/10/2017, 12:33 PM

## 2017-01-10 NOTE — Progress Notes (Signed)
Report called to inpatient rehab nurse of [facility] [place]. Patient transported in bed with IV access as requested.

## 2017-01-11 ENCOUNTER — Inpatient Hospital Stay (HOSPITAL_COMMUNITY): Payer: Medicare HMO

## 2017-01-11 ENCOUNTER — Inpatient Hospital Stay (HOSPITAL_COMMUNITY): Payer: Medicare HMO | Admitting: Occupational Therapy

## 2017-01-11 ENCOUNTER — Inpatient Hospital Stay (HOSPITAL_COMMUNITY): Payer: Medicare HMO | Admitting: Physical Therapy

## 2017-01-11 DIAGNOSIS — C7931 Secondary malignant neoplasm of brain: Secondary | ICD-10-CM

## 2017-01-11 DIAGNOSIS — G8191 Hemiplegia, unspecified affecting right dominant side: Principal | ICD-10-CM

## 2017-01-11 LAB — COMPREHENSIVE METABOLIC PANEL
ALK PHOS: 47 U/L (ref 38–126)
ALT: 26 U/L (ref 14–54)
ANION GAP: 8 (ref 5–15)
AST: 20 U/L (ref 15–41)
Albumin: 3.1 g/dL — ABNORMAL LOW (ref 3.5–5.0)
BILIRUBIN TOTAL: 1 mg/dL (ref 0.3–1.2)
BUN: 32 mg/dL — ABNORMAL HIGH (ref 6–20)
CALCIUM: 8.8 mg/dL — AB (ref 8.9–10.3)
CO2: 27 mmol/L (ref 22–32)
Chloride: 104 mmol/L (ref 101–111)
Creatinine, Ser: 0.97 mg/dL (ref 0.44–1.00)
GFR, EST NON AFRICAN AMERICAN: 55 mL/min — AB (ref >60)
GLUCOSE: 74 mg/dL (ref 65–99)
POTASSIUM: 3.7 mmol/L (ref 3.5–5.1)
Sodium: 139 mmol/L (ref 135–145)
TOTAL PROTEIN: 5.1 g/dL — AB (ref 6.5–8.1)

## 2017-01-11 LAB — CBC WITH DIFFERENTIAL/PLATELET
Basophils Absolute: 0 10*3/uL (ref 0.0–0.1)
Basophils Relative: 0 %
Eosinophils Absolute: 0 10*3/uL (ref 0.0–0.7)
Eosinophils Relative: 0 %
HEMATOCRIT: 35.5 % — AB (ref 36.0–46.0)
HEMOGLOBIN: 11.3 g/dL — AB (ref 12.0–15.0)
LYMPHS ABS: 0.8 10*3/uL (ref 0.7–4.0)
Lymphocytes Relative: 6 %
MCH: 29.2 pg (ref 26.0–34.0)
MCHC: 31.8 g/dL (ref 30.0–36.0)
MCV: 91.7 fL (ref 78.0–100.0)
MONOS PCT: 5 %
Monocytes Absolute: 0.7 10*3/uL (ref 0.1–1.0)
NEUTROS ABS: 13.1 10*3/uL — AB (ref 1.7–7.7)
NEUTROS PCT: 89 %
Platelets: 99 10*3/uL — ABNORMAL LOW (ref 150–400)
RBC: 3.87 MIL/uL (ref 3.87–5.11)
RDW: 15 % (ref 11.5–15.5)
WBC: 14.7 10*3/uL — ABNORMAL HIGH (ref 4.0–10.5)

## 2017-01-11 MED ORDER — SALMETEROL XINAFOATE 50 MCG/DOSE IN AEPB: 1.0000 | INHALATION_SPRAY | Freq: Two times a day (BID) | RESPIRATORY_TRACT | 12 refills | Status: AC

## 2017-01-11 MED ORDER — ACETAMINOPHEN 500 MG PO TABS: 1000.0000 mg | ORAL_TABLET | Freq: Three times a day (TID) | ORAL | 0 refills | Status: AC | PRN

## 2017-01-11 MED ORDER — ARFORMOTEROL TARTRATE 15 MCG/2ML IN NEBU
15.0000 ug | INHALATION_SOLUTION | Freq: Two times a day (BID) | RESPIRATORY_TRACT | Status: DC
  Administered 2017-01-11 – 2017-01-27 (×32): 15 ug via RESPIRATORY_TRACT
  Filled 2017-01-11 (×37): qty 2

## 2017-01-11 MED ORDER — NYSTATIN 100000 UNIT/GM EX POWD
Freq: Three times a day (TID) | CUTANEOUS | Status: DC
  Administered 2017-01-11 – 2017-01-28 (×38): via TOPICAL
  Filled 2017-01-11 (×2): qty 15

## 2017-01-11 NOTE — Evaluation (Signed)
REEVAL  Patient Details  Name: Tracy Bridges MRN: [id number] Date of Birth: Sep 20, 1940  OT Diagnosis: disturbance of vision, hemiplegia affecting dominant side, muscle weakness (generalized) and coordination disorder Rehab Potential: Rehab Potential (ACUTE ONLY): Excellent ELOS: 14-18 days   Today's Date:  01/11/2017 OT Individual Time: [phone] OT Individual Time Calculation (min): 60 min   Problem List:      Patient Active Problem List   Diagnosis Date Noted  . Metastatic cancer to brain ([place]) 01/07/2017  . Brain tumor ([place]) 12/30/2016  . Focal seizure ([place])   . Atrial fibrillation ([place]) 12/26/2016  . Emphysema/COPD ([place]) 12/26/2016  . Brain mass 12/26/2016  . Paresthesia of right arm 12/26/2016  . 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 Right sided weakness   . History of lung cancer   . GERD (gastroesophageal reflux disease) 12/03/2014    Past Medical History:      Past Medical History:  Diagnosis Date  . Asthma   . Atrial fibrillation ([place])   . Emphysema/COPD ([place])   . Lung cancer [facility])    Past Surgical History:       Past Surgical History:  Procedure Laterality Date  . ABDOMINAL HYSTERECTOMY    . APPLICATION OF CRANIAL NAVIGATION Left 12/30/2016   Procedure: APPLICATION OF CRANIAL NAVIGATION;  Surgeon: Ashok Pall, MD;  Location: [place];  Service: Neurosurgery;  Laterality: Left;  APPLICATION OF CRANIAL NAVIGATION  . CESAREAN SECTION    . CRANIOTOMY Left 12/30/2016   Procedure: CRANIOTOMY TUMOR EXCISION;  Surgeon: Ashok Pall, MD;  Location: [place];  Service: Neurosurgery;  Laterality: Left;  CRANIOTOMY TUMOR EXCISION  . HERNIA REPAIR    . LUNG REMOVAL, PARTIAL  04/1997    Assessment & Plan Clinical Impression: Tracy S Jonesis a 76 y.o.femalewith history of A fib, emphysema, bronchiectasis?--uses percussion vest and nebs tid, lung cancer who was admitted on 12/26/16 with abnormalRUE motor activity. Patient with 6  week history of progressive right sided weakness with foot drop, headaches and nausea but unable to tolerate MRI for work up. Head CT done revealing She was as well as large ovoid mass in high left frontal lobe with vasogenic edema. 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 She was started on Keppra as well as steroids. Eliquis placed on hold and patient transitioned to IV heparin. CT abdomen/pelvis done showing no evidence of metastatic disease and incidental findings of left thyroid nodule and moderate size HH. She underwent craniotomy for tumor resection on 12/31/16 by Dr. Christella Noa. Pathology revealed metastatic lung cancerand Dr. Alen Blew consulted for input. He reheme/onc input pending. Patient with resultant right hemiplegia, lethargy, as well as deficits in processing and following commands. CIR recommended due to deficits in mobility and ability to carry out ADL tasks.   Patient currently requires max A with basic self-care skills secondary to muscle weakness, decreased cardiorespiratoy endurance, impaired timing and sequencing, unbalanced muscle activation and and decreased coordination.  Prior to hospitalization, patient could complete BADLs with modified independent .  Patient will benefit from skilled intervention to increase independence with basic self-care skills prior to discharge home with spouse.  Anticipate patient will require 24 hour supervision and follow up home health and follow up outpatient.  OT - End of Session Endurance Deficit: Yes OT Assessment Rehab Potential (ACUTE ONLY): Excellent OT Barriers to Discharge: Home environment access/layout OT Barriers to Discharge Comments:  (  bathroom accessibility) OT Patient demonstrates impairments in the following area(s): Balance;Safety;Sensory;Endurance;Motor;Vision OT Basic ADL's Functional Problem(s):  Grooming;Bathing;Dressing;Toileting;Eating  OT Transfers Functional Problem(s): Toilet;Tub/Shower OT Additional Impairment(s): Fuctional Use of Upper Extremity OT Plan OT Intensity: Minimum of 1-2 x/day, 45 to 90 minutes OT Frequency: 5 out of 7 days OT Duration/Estimated Length of Stay: 14-18  days OT Treatment/Interventions: Balance/vestibular training;Discharge planning;Pain management;Self Care/advanced ADL retraining;Therapeutic Activities;UE/LE Coordination activities;Disease mangement/prevention;Functional mobility training;Patient/family education;Therapeutic Exercise;Visual/perceptual remediation/compensation;Community reintegration;DME/adaptive equipment instruction;Neuromuscular re-education;Psychosocial support;UE/LE Strength taining/ROM;Wheelchair propulsion/positioning OT Self Feeding Anticipated Outcome(s): Supervision/setup OT Basic Self-Care Anticipated Outcome(s): Supervision/setup OT Toileting Anticipated Outcome(s): Supervision/setup OT Bathroom Transfers Anticipated Outcome(s): Supervision/setup OT Recommendation Recommendations for Other Services: Vestibular eval;Neuropsych consult;Therapeutic Recreation consult Therapeutic Recreation Interventions: Pet therapy;Stress management;Kitchen group Patient destination: Home Follow Up Recommendations: Home health OT;Outpatient OT Equipment Recommended: Tub/shower seat;To be determined   Skilled Therapeutic Intervention Re-eval initiated with Pt. Pt similar to previous CIR stay requiring max A for basic transfers and sit to stands during toileting, toilet transfers and for dressing. Pt required max A sit to stands and transferred into shower with [name]. PT required multiple rest breaks after each mobility task; demonstrating labored breathing. One of the sit to stands out of shower; off the tub bench required max A +2 due to fatigue.  Also noted pt with difficulty with coordination during transfers- not coordinating UE and LEs to  stand. Left in recliner to rest at end of session.    OT Evaluation Precautions/Restrictions  Precautions Precautions: Other (comment) Precaution Comments: claustrophobia Restrictions Weight Bearing Restrictions: No General Chart Reviewed: Yes Family/Caregiver Present:no  Pain No c/o pain during tx  Home Living/Prior Functioning Home Living Available Help at Discharge: Family, Available 24 hours/day Type of Home: House Home Layout: One level Bathroom Shower/Tub: Tub/shower unit, Walk-in shower (with TTB) Bathroom Toilet: Standard Bathroom Accessibility: No Additional Comments: She is opting to use walk in shower at d/c (or have spouse install large walk in tub). Per pt, being in the tub exacerbates her claustrophobia   Lives With: Spouse IADL History Homemaking Responsibilities: Yes Meal Prep Responsibility: Primary Laundry Responsibility: Primary Cleaning Responsibility: Primary Bill Paying/Finance Responsibility: Primary Shopping Responsibility: Primary Homemaking Comments: Very active. Managing the home + her home church  Occupation: Retired Type of Occupation: Worked for [employer] and [place]: [place], [employer], knitting/crocheting, painting, yard work/growing flowers  IADL Comments: Per husband, she "works 12 hour days" Prior Function Level of Independence: Independent with basic ADLs, Independent with homemaking with ambulation (before last June)  Able to Take Stairs?: Yes Driving: No Leisure: Hobbies-yes (Comment) Comments: Pt was indep as of June, slow progression to require assistance w/ ADLs, IADLs, and gait (Min guard to HHA/supervision). Enjoys gardening and tending to flower bed.  ADL ADL ADL Comments: Please see functional navigator for ADL status Vision Baseline Vision/History: Wears glasses Wears Glasses: Reading only Vision Assessment?: Yes Additional Comments: Able to read names of nursing staff on wall mounted  board, accurately describe details/colors of portrait in room. However, she reports the room seems like it's moving. Perception  Perception: Within Functional Limits Praxis Praxis: Intact Cognition Overall Cognitive Status: Within Functional Limits for tasks assessed Arousal/Alertness: Awake/alert Orientation Level: Person;Place;Situation Person: Oriented Place: Oriented Situation: Oriented Year: 2018 Month: October Day of Week: Correct Memory: Appears intact Immediate Memory Recall: Sock;Blue;Bed Memory Recall: Sock;Blue;Bed Memory Recall Sock: Without Cue Memory Recall Blue: Without Cue Memory Recall Bed: Without Cue Attention: Sustained Sustained Attention: Appears intact Awareness: Appears intact Problem Solving: Appears intact  Safety/Judgment: Appears intact Sensation Sensation Light Touch: Impaired by gross assessment (Impaired R UE/LE. Per pt "feels like they're half dead") Stereognosis: Not tested Hot/Cold: Appears Intact Proprioception: Appears Intact Coordination Gross Motor Movements are Fluid and Coordinated: No Fine Motor Movements are Fluid and Coordinated: No Coordination and Movement Description: Affected by hemiparesis + sensation changes Motor  Motor Motor: Hemiplegia Motor - Skilled Clinical Observations: R hemiplegia, generalized weakness + endurance deficits Mobility  Transfers Transfers: Sit to Stand;Stand to Sit Sit to Stand: max A t;From toilet Stand to Sit: Max assist;To toilet  Trunk/Postural Assessment  Cervical Assessment Cervical Assessment: Within Functional Limits Thoracic Assessment Thoracic Assessment: Exceptions to [facility] (kyphotic) Lumbar Assessment Lumbar Assessment: Within Functional Limits Postural Control Postural Control: Deficits on evaluation  Balance Balance Balance Assessed: Yes Dynamic Sitting Balance Dynamic Sitting - Level of Assistance: 4: Min assist (for LB bathing; Mod A dynamic standing during shower and toilet  transfers) Standing max A statically for dressing  Extremity/Trunk Assessment RUE Assessment RUE Assessment: Exceptions to [facility] (2+/5 MMT, FMC/sensation deficits ) LUE Assessment LUE Assessment: Within Functional Limits   See Function Navigator for Current Functional Status.   Refer to Care Plan for Long Term Goals  Recommendations for other services: Neuropsych, Therapeutic Recreation  Pet therapy, Kitchen group and Outing/community reintegration and Other: vestibular consult   Discharge Criteria: Patient will be discharged from OT if patient refuses treatment 3 consecutive times without medical reason, if treatment goals not met, if there is a change in medical status, if patient makes no progress towards goals or if patient is discharged from hospital.  The above assessment, treatment plan, treatment alternatives and goals were discussed and mutually agreed upon: by patient and by family

## 2017-01-11 NOTE — Progress Notes (Signed)
Retta Diones, RN Rehab Admission Coordinator Signed Physical Medicine and Rehabilitation  PMR Pre-admission Date of Service: 01/06/2017 3:39 PM  Related encounter: ED to Hosp-Admission (Discharged) from 12/26/2016 in [place]       '[]' Hide copied text PMR Admission Coordinator Pre-Admission Assessment  Patient: Tracy Bridges is an 76 y.o., female MRN: [id number] DOB: 1940-08-17 Height: '5\' 6"'  (167.6 cm) Weight: 86.2 kg (190 lb 1.6 oz)                                                                                                                                      Insurance Information HMO:     PPO: Yes     PCP:       IPA:       80/20:       OTHER:   PRIMARY:  [employer] Medicare      Policy#: [id number]      Subscriber: Shawn Stall CM Name: Lelan Pons      Phone#: [phone]     Fax#: [phone] Pre-Cert#: [id number] until 01/12/17 with update due on 01/10/17             Employer:  Retired Benefits:  Phone #: [phone]     Name: Online Eff. 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 Patient with 6 week history of progressive right sided weakness with foot drop, headaches and nausea but unable to tolerate MRI for work up. Head CT done revealing She was as well as large ovoid mass in high left frontal lobe with vasogenic edema. 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 She was started on Keppra as well as steroids. Eliquis placed on hold and patient transitioned to IV heparin. CT abdomen/pelvis done showing no evidence  of metastatic disease and incidental findings of left thyroid nodule and moderate size HH. She underwent craniotomy for tumor resection on 12/31/16 by Dr. Christella Noa. Pathology revealed metastatic lung cancer and heme/onc input pending. Patient with resultant right hemiplegia, lethargy, as well as deficits in processing and following commands. CIR recommended due to deficits in mobility and ability to carry out ADL tasks.   Husband supportive and has been providing physical assistance since June when she started having weakness with foot drop. He has been helping with home management.     Total: 2=NIH  Past Medical History      Past Medical History:  Diagnosis Date  . Asthma   . Atrial fibrillation ([place])   . Emphysema/COPD ([place])   . Lung cancer ([place])     Family History  family history includes Heart disease in her other; Liver cancer in her other.  Prior Rehab/Hospitalizations: No previous rehab  Has the patient had major surgery during 100 days prior to admission? No  Current Medications   Current Facility-Administered Medications:  .  0.9 %  sodium chloride infusion, , Intravenous, Continuous, Belinda Block, MD, Stopped at 12/30/16 2000 .  acetaminophen (TYLENOL) tablet 650 mg, 650 mg, Oral, Q4H PRN, 650 mg at 01/06/17 1703 **OR** acetaminophen (TYLENOL) suppository 650 mg, 650 mg,  Rectal, Q4H PRN, Ashok Pall, MD .  albuterol (PROVENTIL) (2.5 MG/3ML) 0.083% nebulizer solution 3 mL, 3 mL, Inhalation, Q4H PRN, Opyd, Ilene Qua, MD .  arformoterol (BROVANA) nebulizer solution 15 mcg, 15 mcg, Nebulization, BID, Opyd, Ilene Qua, MD, 15 mcg at 01/07/17 0928 .  bisacodyl (DULCOLAX) EC tablet 5 mg, 5 mg, Oral, Daily PRN, Opyd, Timothy S, MD .  budesonide (PULMICORT) nebulizer solution 0.5 mg, 0.5 mg, Nebulization, BID, Eugenie Filler, MD, 0.5 mg at 01/07/17 8786 .  calcium-vitamin D (OSCAL WITH D) 500-200 MG-UNIT per tablet 1 tablet, 1 tablet, Oral, QHS, Opyd, Ilene Qua, MD, 1 tablet at 01/06/17 2149 .  [COMPLETED] dexamethasone (DECADRON) tablet 6 mg, 6 mg, Oral, Q6H, 6 mg at 12/31/16 1137 **FOLLOWED BY** [COMPLETED] dexamethasone (DECADRON) tablet 4 mg, 4 mg, Oral, Q6H, 4 mg at 01/01/17 1346 **FOLLOWED BY** dexamethasone (DECADRON) tablet 4 mg, 4 mg, Oral, Q8H, Ashok Pall, MD, 4 mg at 01/07/17 7672 .  diltiazem (CARDIZEM CD) 24 hr capsule 240 mg, 240 mg, Oral, Daily, Ashok Pall, MD, 240 mg at 01/07/17 0851 .  HYDROcodone-acetaminophen (NORCO/VICODIN) 5-325 MG per tablet 1-2 tablet, 1-2 tablet, Oral, Q4H PRN, Opyd, Ilene Qua, MD, 2 tablet at 01/07/17 0407 .  HYDROcodone-homatropine (HYCODAN) 5-1.5 MG/5ML syrup 5 mL, 5 mL, Oral, QHS PRN, Opyd, Timothy S, MD .  ipratropium-albuterol (DUONEB) 0.5-2.5 (3) MG/3ML nebulizer solution 3 mL, 3 mL, Nebulization, TID, Opyd, Ilene Qua, MD, 3 mL at 01/07/17 0927 .  lactated ringers infusion, , Intravenous, Continuous, Kristeen Miss, MD, Last Rate: 75 mL/hr at 01/03/17 0823 .  levETIRAcetam (KEPPRA) tablet 500 mg, 500 mg, Oral, BID, Opyd, Ilene Qua, MD, 500 mg at 01/07/17 0852 .  LORazepam (ATIVAN) injection 1-2 mg, 1-2 mg, Intravenous, Q6H PRN, Opyd, Timothy S, MD .  magnesium citrate solution 1 Bottle, 1 Bottle, Oral, Once PRN, Ashok Pall, MD .  meclizine (ANTIVERT) tablet 25 mg, 25 mg, Oral, TID, Costella, Vincent J, PA-C, 25 mg at  01/07/17 0851 .  morphine 4 MG/ML injection 1-2 mg, 1-2 mg, Intravenous, Q2H PRN, Ashok Pall, MD .  multivitamin with minerals tablet 1 tablet, 1 tablet, Oral, Daily, Opyd, Ilene Qua, MD, 1 tablet  at 01/07/17 0852 .  naloxone [facility]) injection 0.08 mg, 0.08 mg, Intravenous, PRN, Ashok Pall, MD .  ondansetron (ZOFRAN) tablet 4 mg, 4 mg, Oral, Q6H PRN, 4 mg at 01/06/17 1032 **OR** ondansetron (ZOFRAN) injection 4 mg, 4 mg, Intravenous, Q6H PRN, Opyd, Ilene Qua, MD, 4 mg at 01/07/17 0407 .  pantoprazole (PROTONIX) EC tablet 40 mg, 40 mg, Oral, BID AC, Opyd, Ilene Qua, MD, 40 mg at 01/07/17 0852 .  promethazine (PHENERGAN) injection 12.5 mg, 12.5 mg, Intravenous, Q6H PRN, Kristeen Miss, MD, 12.5 mg at 01/07/17 0853 .  promethazine (PHENERGAN) suppository 25 mg, 25 mg, Rectal, Q6H PRN, Ashok Pall, MD .  senna (SENOKOT) tablet 8.6 mg, 1 tablet, Oral, BID, Ashok Pall, MD, 8.6 mg at 01/07/17 0851 .  senna-docusate (Senokot-S) tablet 1 tablet, 1 tablet, Oral, QHS PRN, Opyd, Timothy S, MD .  sodium chloride flush (NS) 0.9 % injection 10-40 mL, 10-40 mL, Intracatheter, PRN, Hosie Poisson, MD, 10 mL at 01/06/17 0824  Patients Current Diet: Diet full liquid Room service appropriate? Yes; Fluid consistency: Thin  Precautions / Restrictions Precautions Precautions: Fall Restrictions Weight Bearing Restrictions: No   Has the patient had 2 or more falls or a fall with injury in the past year?Yes.  Patient reports at least 3 falls associated with trying to shower.  Prior Activity Level Limited Community (1-2x/wk): Went out 3-4 times a week.  Home Assistive Devices / Equipment Home Assistive Devices/Equipment: Wheelchair, Oxygen, Radio producer (specify quad or straight), Other (Comment) Sports coach for pysiotherapy) Home Equipment: Wheelchair - manual, Environmental consultant - 2 wheels, Cane - single point, Tub bench (items belonged to mother & sister (deceased))  Prior Device Use: Indicate devices/aids used by the  patient prior to current illness, exacerbation or injury? Cane and furniture walked.  Prior Functional Level Prior Function Level of Independence: Independent with assistive device(s) Comments: Per pt's family, she was independent with ADLs and IADLs several months ago, and has progressively gotten worse. Pt stated in June she was mowing lawns  Self Care: Did the patient need help bathing, dressing, using the toilet or eating?  Needed some help  Indoor Mobility: Did the patient need assistance with walking from room to room (with or without device)? Independent  Stairs: Did the patient need assistance with internal or external stairs (with or without device)? Needed some help  Functional Cognition: Did the patient need help planning regular tasks such as shopping or remembering to take medications? Independent  Current Functional Level Cognition  Overall Cognitive Status: Impaired/Different from baseline Current Attention Level: Sustained Orientation Level: Oriented X4 Following Commands: Follows one step commands consistently, Follows one step commands with increased time Safety/Judgement: Decreased awareness of safety General Comments: Pt requiring tactile and verbal cues for grooming at sink and safe transfer technqiues.    Extremity Assessment (includes Sensation/Coordination)  Upper Extremity Assessment: RUE deficits/detail RUE Deficits / Details: Poor grasp strength and opposition (increased time to complete opposition). Able to bring hand to mouth with Min A while sitting and support at elbow and wrist.  Pt bringing toothrbush to mouth with Mod A to support elbow and wrist; requring support to bring toothbrush to back of mouth.  RUE Sensation: decreased light touch RUE Coordination: decreased fine motor, decreased gross motor  Lower Extremity Assessment: Defer to PT evaluation RLE Deficits / Details: DF, PF and knee ext 1/5; noted slight contraction unable to elicit  true muscle test but able to advance RLE during amb with decreased DF RLE Sensation: decreased light touch  LLE Deficits / Details: general 4-/5; sensation intact to LT    ADLs  Overall ADL's : Needs assistance/impaired Eating/Feeding: Minimal assistance, Sitting Eating/Feeding Details (indicate cue type and reason): Pt able to self feed with L hand with min A to open lids and containers Grooming: Oral care, Moderate assistance, Standing Grooming Details (indicate cue type and reason): Mod A +2 to maintain balance at sink. Mod A to use R hand while performing oral care. Mod A to support RUE at elbow and wrist; assist to bring hand back and forth when brushing teeth instead of moving head.  Upper Body Bathing: Moderate assistance, Sitting Lower Body Bathing: Maximal assistance, Sit to/from stand Upper Body Dressing : Moderate assistance, Sitting Lower Body Dressing: Maximal assistance, Sit to/from stand Functional mobility during ADLs: Minimal assistance, +2 for safety/equipment, Rolling walker, Cueing for sequencing, Cueing for safety General ADL Comments: Pt demonstrating decreased functional performance. RUE with decreased grasp strength, coorination, ROM, and fucntional use. Pt with decreased standing balance    Mobility  Overal bed mobility: Needs Assistance Bed Mobility: Sit to Supine Sit to supine: Mod assist, +2 for physical assistance General bed mobility comments: increased time and effort, assist with trunk guidance and bilateral LEs to return to supine    Transfers  Overall transfer level: Needs assistance Equipment used: Rolling walker (2 wheeled) Transfers: Sit to/from Stand, Stand Pivot Transfers Sit to Stand: Min assist Stand pivot transfers: Mod assist, +2 physical assistance, +2 safety/equipment General transfer comment: increased time and effort, cueing for hand placement, min A to rise from chair x1 and from [facility] x1, mod A x2 for safety with pivotal movement to [facility]     Ambulation / Gait / Stairs / Wheelchair Mobility  Ambulation/Gait Ambulation/Gait assistance: Mod assist, +2 physical assistance Ambulation Distance (Feet): 10 Feet Assistive device: Rolling walker (2 wheeled) Gait Pattern/deviations: Step-to pattern, Decreased stance time - right, Decreased step length - right, Decreased step length - left, Decreased dorsiflexion - right, Decreased dorsiflexion - left, Decreased weight shift to right, Trunk flexed General Gait Details: attempted ambulation this session, however, pt very lethargic and difficulty advancing L LE to move forwards Gait velocity: decreased Gait velocity interpretation: <1.8 ft/sec, indicative of risk for recurrent falls    Posture / Balance Balance Overall balance assessment: Needs assistance Sitting-balance support: Feet supported Sitting balance-Leahy Scale: Fair Standing balance support: During functional activity, Bilateral upper extremity supported Standing balance-Leahy Scale: Poor Standing balance comment: reliable on bilateral UEs on RW    Special needs/care consideration BiPAP/CPAP No CPM No Continuous Drip IV KVO Dialysis No      Life Vest No Oxygen Uses 02 2L [place] at night at home Special Bed No Trach Size No Wound Vac (area) No     Skin Reports moles on skin which have been checked                            Bowel mgmt: Last BM 12/30/16 Bladder mgmt: Voiding up on [facility] with assist Diabetic mgmt No    Previous Home Environment Living Arrangements: Spouse/significant other Available Help at Discharge: Family, Available 24 hours/day Type of Home: House Home Layout: One level Home Access: Stairs to enter Entrance Stairs-Rails: None Entrance Stairs-Number of Steps: 1 Bathroom Shower/Tub: Chiropodist: Handicapped height Bathroom Accessibility: Yes Home Care Services: No  Discharge Living Setting Plans for Discharge Living Setting: Patient's home, House, Lives with (comment)  (Lives with husband.) Type of  Home at Discharge: House Discharge Home Layout: One level Discharge Home Access: Stairs to enter Entrance Stairs-Number of Steps: 1 step to portch, then 1 step and then 1 step into kitchen entrance. Does the patient have any problems obtaining your medications?: No  Social/Family/Support Systems Patient Roles: Spouse, Parent (Has a spouse and children.) Contact Information: Elenora Fender - spouse Anticipated Caregiver: Spouse Anticipated Caregiver's Contact Information: Patrick Jupiter - spouse - [phone] Ability/Limitations of Caregiver: Husband is retired and can assist. Building control surveyor Availability: 24/7 Discharge Plan Discussed with Primary Caregiver: Yes Is Caregiver In Agreement with Plan?: Yes Does Caregiver/Family have Issues with Lodging/Transportation while Pt is in Rehab?: No  Goals/Additional Needs Patient/Family Goal for Rehab: PT/OT/SLP supervision goals Expected length of stay: 14-16 days Cultural Considerations: [place]; husband is a Theme park manager, they just built a new church. Dietary Needs: Full liquids, thin liquids Equipment Needs: TBD Pt/Family Agrees to Admission and willing to participate: Yes Program Orientation Provided & Reviewed with Pt/Caregiver Including Roles  & Responsibilities: Yes  Decrease burden of Care through IP rehab admission: N/A  Possible need for SNF placement upon discharge: Not planned  Patient Condition: This patient's medical and functional status has changed since the consult dated: 01/04/17 in which the Rehabilitation Physician determined and documented that the patient's condition is appropriate for intensive rehabilitative care in an inpatient rehabilitation facility. See "History of Present Illness" (above) for medical update. Functional changes are: Currently requiring min to mod assist for transfers. Patient's medical and functional status update has been discussed with the Rehabilitation physician and patient remains  appropriate for inpatient rehabilitation. Will admit to inpatient rehab today.  Preadmission Screen Completed By:  Retta Diones, 01/07/2017 2:03 PM ______________________________________________________________________   Discussed status with Dr. Naaman Plummer on 01/07/17 at 1404 and received telephone approval for admission today.  Admission Coordinator:  Retta Diones, time 1404/Date 01/07/17       Cosigned by: Meredith Staggers, MD at 01/07/2017 2:29 PM  Revision History

## 2017-01-11 NOTE — Evaluation (Signed)
Speech Language Pathology Assessment and Plan  Patient Details  Name: Tracy Bridges MRN: [id number] Date of Birth: 03/18/1941  SLP Diagnosis: Cognitive Impairments;Dysphagia  Rehab Potential: Good ELOS: 01/15/2017    Today's Date: 01/11/2017 SLP Individual Time: 0815-0903 SLP Individual Time Calculation (min): 48 min   Problem List:  Patient Active Problem List   Diagnosis Date Noted  . Vascular headache   . Non-intractable vomiting with nausea   . Leukocytosis   . DVT (deep venous thrombosis) (HCC) 01/09/2017  . 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 Brain tumor (HCC) 12/30/2016  . Focal seizure (HCC)   . Atrial fibrillation (HCC) 12/26/2016  . Emphysema/COPD (HCC) 12/26/2016  . Brain mass 12/26/2016  . Paresthesia of right arm 12/26/2016  . 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 Right sided weakness   . History of lung cancer   . GERD (gastroesophageal reflux disease) 12/03/2014   Past Medical History:  Past Medical History:  Diagnosis Date  . Asthma   . Atrial fibrillation (HCC)   . Emphysema/COPD (HCC)   . Lung cancer (HCC)    Past Surgical History:  Past Surgical History:  Procedure Laterality Date  . ABDOMINAL HYSTERECTOMY    . APPLICATION OF CRANIAL NAVIGATION Left 12/30/2016   Procedure: APPLICATION OF CRANIAL NAVIGATION;  Surgeon: Cabbell, Kyle, MD;  Location: [facility] OR;  Service: Neurosurgery;  Laterality: Left;  APPLICATION OF CRANIAL NAVIGATION  . CESAREAN SECTION    . CRANIOTOMY Left 12/30/2016   Procedure: CRANIOTOMY TUMOR EXCISION;  Surgeon: Cabbell, Kyle, MD;  Location: [facility] OR;  Service: Neurosurgery;  Laterality: Left;  CRANIOTOMY TUMOR EXCISION  . HERNIA REPAIR    . IR IVC FILTER PLMT / S&I /IMG GUID/MOD SED  01/10/2017  . IR IVC FILTER REPOSITION / S&I /IMG GUID/MOD SED  01/10/2017  . LUNG REMOVAL, PARTIAL  04/1997    Assessment / Plan / Recommendation Clinical Impression Tracy Bridges a 76 y.o.femalewith history of A fib,  emphysema, bronchiectasis?--uses percussion vest and nebs tid, lung cancer who was admitted on 12/26/16 with abnormalRUE motor activity. Patient with 6 week history of progressive right sided weakness with foot drop, headaches and nausea but unable to tolerate MRI for work up. Head CT done revealing She was as well as large ovoid mass in high left frontal lobe with vasogenic edema. 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 She was started on Keppra as well as steroids. Eliquis placed on hold and patient transitioned to IV heparin. CT abdomen/pelvis done showing no evidence of metastatic disease and incidental findings of left thyroid nodule and moderate size HH. She underwent craniotomy for tumor resection on 12/31/16 by Dr. Cabbell. Pathology revealed metastatic lung cancerand Dr. Shadad consulted for input. He reheme/onc input pending. Patient with resultant right hemiplegia, lethargy, as well as deficits in processing and following commands. CIR recommended due to deficits in mobility and ability to carry out ADL tasks.   Pt was admitted to CIR on 01/10/2017 and evaluated for speech, swallowing and cognition skills on 01/11/2017. Pt presented with speech WFL during evaluation tasks. Pt presents WFL during BSE of Dys 2, Dys 3 and thin liquids via straw and cup sips, however states she does not feel comfortable advancing solids to Dys 3 due to premorbid esophageal issues and demonstrated no overt s/s aspiration or esophageal impairments. SLP suspects pt's diet piror to admission was   Dys 3 per pt description of diet. Pt demonstrated WFL oral motor skills. Pt presents with mild cognitive impairment in higher level cognition skills pretaining to semi-complex problem solving and aniticpatory awareness with current deficits. Pt was assessed informally for cognition utilizing subsections of the AFLA and  MOCA version 7.1, demonstrating WFL orientation, recall, ability to follow multi-step directions, and basic problem solving, although pt would benefit from further cognitive assessment.  Pt would benefit from skilled ST services to assess diet advancement and improve functional independence in semi-complex problem solving skills and anticipatory awareness in order to reduce burden of care at home with husband and possibly 24 supervision.    Skilled Therapeutic Interventions          Skilled ST services focused on swallow skills. SLP provided trials of dys 3 textured foods cueing pt to consume slightly large bites, however pt refused due to esophageal issues. SLP provided skilled observation of current diet dys 2 and thin with no overt s/s aspiration and consuming small bites. Pt requested to stay on dys 2 for now, because she was nervous an upgrade was too much for her esophageal issues. Diet recommendations communicated to nurse to remain on dys 2 with therapeutic trials of dys 3 for diet advancement. Pt was left in room with call bell within reach. Recommend to continue ST services.     SLP Assessment  Patient will need skilled Speech Lanaguage Pathology Services during CIR admission    Recommendations  SLP Diet Recommendations: Dysphagia 2 (Fine chop);Thin Liquid Administration via: Cup;Straw Medication Administration: Whole meds with liquid Supervision: Patient able to self feed;Intermittent supervision to cue for compensatory strategies Compensations: Minimize environmental distractions;Slow rate;Small sips/bites Postural Changes and/or Swallow Maneuvers: Seated upright 90 degrees Oral Care Recommendations: Oral care BID Patient destination: Home Follow up Recommendations: None Equipment Recommended: None recommended by SLP    SLP Frequency 1 to 3 out of 7 days   SLP Duration  SLP Intensity  SLP Treatment/Interventions 01/15/2017  Minumum of 1-2 x/day, 30 to 90 minutes  Cognitive  remediation/compensation;Dysphagia/aspiration precaution training;Medication managment;Functional tasks;Cueing hierarchy    Pain Pain Assessment Pain Assessment: No/denies pain  Prior Functioning Cognitive/Linguistic Baseline: Information not available Type of Home: House  Lives With: Spouse Available Help at Discharge: Family;Available 24 hours/day Education:  (10th grade) 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  Problem Solving Problem solving assist level: Solves basic 90% of the time/requires cueing < 10% of the time  Memory Memory assist level: Recognizes or recalls 90% of the time/requires cueing < 10% of the time   Short Term Goals: Week 1: SLP Short Term Goal 1 (Week 1): Pt will tolerate trials of Dys 3 without overt s/s aspiration and supervision A verbal cues to utilize swallow compensatory strategies in order to assess diet advancement. SLP Short Term Goal 2 (Week 1): Pt will demonstrate aniticpatory awareness and identify 3 cognitive tasks she can participate in safely with Min A question cues.  SLP Short Term Goal 3 (Week 1): Pt demonstrate functional problem solving for semi-complex taks with Min A verbal cues.   Refer  to Care Plan for Long Term Goals  Recommendations for other services: None   Discharge Criteria: Patient will be discharged from SLP if patient refuses treatment 3 consecutive times without medical reason, if treatment goals not met, if there is a change in medical status, if patient makes no progress towards goals or if  patient is discharged from hospital.  The above assessment, treatment plan, treatment alternatives and goals were discussed and mutually agreed upon: by patient  Dreama Kuna  [facility] 01/11/2017, 4:26 PM

## 2017-01-11 NOTE — Discharge Instructions (Signed)
Inpatient Rehab Discharge Instructions  Tracy Bridges Discharge date and time:    Activities/Precautions/ Functional Status: Activity: no lifting, driving, or strenuous exercise for till cleared by MD Diet: regular diet Wound Care: keep wound clean and dry Functional status:  ___ No restrictions     ___ Walk up steps independently ___ 24/7 supervision/assistance   ___ Walk up steps with assistance ___ Intermittent supervision/assistance  ___ Bathe/dress independently ___ Walk with walker     ___ Bathe/dress with assistance ___ Walk Independently    ___ Shower independently ___ Walk with assistance    ___ Shower with assistance ___ No alcohol     ___ Return to work/school ________  Special Instructions:    My questions have been answered and I understand these instructions. I will adhere to these goals and the provided educational materials after my discharge from the hospital.  Patient/Caregiver Signature _______________________________ Date __________  Clinician Signature _______________________________________ Date __________  Please bring this form and your medication list with you to all your follow-up doctor's appointments.

## 2017-01-11 NOTE — Progress Notes (Addendum)
Patient information reviewed and entered into eRehab system by Daiva Nakayama, RN, CRRN, [place] Coordinator.  Information including medical coding and functional independence measure will be reviewed and updated through discharge.     Per nursing patient was given "Data Collection Information Summary for Patients in Inpatient Rehabilitation Facilities with attached "Privacy Act [place] Records" upon admission. This is an interrupted stay, patient admitted 01/07/2017 and went to acute services for a procedure 01/09/2017 returning 01/10/2017.  Please reference both [phone] and [phone] as one record when reviewing this episode of care.

## 2017-01-11 NOTE — Progress Notes (Signed)
Charlett Blake, MD Physician Signed Physical Medicine and Rehabilitation  Consult Note Date of Service: 01/04/2017 4:58 PM  Related encounter: ED to Hosp-Admission (Discharged) from 12/26/2016 in [facility] All Collapse All   [] Hide copied text [] Hover for attribution information      Physical Medicine and Rehabilitation Consult  Reason for Consult:  Deficits in mobility and ADLs.  Referring Physician:  Dr. Christella Noa.    HPI: Tracy Bridges is a 76 y.o. female with history of A fib, emphysema, bronchiectasis?--uses percussion vest and nebs tid,  lung cancer who was admitted on 12/26/16 with abnormal RUE motor activity. Patient with 6 week history of progressive right sided weakness with foot drop, headaches and nausea but unable to tolerate MRI for work up. Head CT done revealing She was as well as large ovoid mass in high left frontal lobe with vasogenic edema. 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  She was started on Keppra as well as steroids. Eliquis placed on hold and patient transitioned to IV heparin. CT abdomen/pelvis done showing no evidence of metastatic disease and incidental findings of  left thyroid nodule and moderate size HH. She underwent craniotomy for tumor resection on 12/31/16 by Dr. Christella Noa. Pathology revealed metastatic lung cancer and heme/onc input pending.  Patient with resultant right hemiplegia, lethargy, as well as deficits in processing and following commands. CIR recommended due to deficits in mobility and ability to carry out ADL tasks.     Husband supportive and has been providing physical assistance since June when she started having weakness with foot drop. He has been helping with home management.    Review of Systems  Constitutional: Positive for malaise/fatigue.  HENT:  Negative for hearing loss and tinnitus.   Eyes: Positive for double vision and photophobia.  Respiratory: Positive for shortness of breath (with activity). Negative for cough.   Cardiovascular: Positive for palpitations. Negative for chest pain and leg swelling.  Gastrointestinal: Negative for constipation, heartburn and nausea.  Genitourinary: Negative for dysuria and urgency.  Musculoskeletal: Positive for myalgias.  Skin: Negative for itching and rash.  Neurological: Positive for sensory change, focal weakness, weakness and headaches (constant).  Psychiatric/Behavioral: Negative for memory loss. The patient is nervous/anxious.           Past Medical History:  Diagnosis Date  . Asthma   . Atrial fibrillation ([place])   . Emphysema/COPD ([place])   . Lung cancer [facility])          Past Surgical History:  Procedure Laterality Date  . ABDOMINAL HYSTERECTOMY    . APPLICATION OF CRANIAL NAVIGATION Left 12/30/2016   Procedure: APPLICATION OF CRANIAL NAVIGATION;  Surgeon: Ashok Pall, MD;  Location: [facility];  Service: Neurosurgery;  Laterality: Left;  APPLICATION OF CRANIAL NAVIGATION  . CESAREAN SECTION    . CRANIOTOMY Left 12/30/2016   Procedure: CRANIOTOMY TUMOR EXCISION;  Surgeon: Ashok Pall, MD;  Location: [facility];  Service: Neurosurgery;  Laterality: Left;  CRANIOTOMY TUMOR EXCISION  . HERNIA REPAIR    . LUNG REMOVAL, PARTIAL  04/1997         Family History  Problem Relation Age of Onset  . Liver cancer Other   . Heart disease Other   . Brain cancer Neg Hx     Social History:  Married. Has needed assistance since June with ADLs  and has been furniture walking due to RLE weakness and instability. Has supportive family.  She  reports that she quit smoking about 38 years ago. Her smoking use included Cigarettes. She has never used smokeless tobacco. She reports that she does not drink alcohol or use drugs.        Allergies  Allergen Reactions  . Aspirin  Shortness Of Breath  . Atenolol Swelling    Throat swelled and closed up  . Boniva [Ibandronic Acid] Other (See Comments)    Pt does not remember specific reaction  . Fluticasone Anaphylaxis and Hives  . Fosamax [Alendronate Sodium] Other (See Comments)    Pt does not remember specific reaction  . Latex Swelling and Rash    Throat swelled and closed up  . Mucinex [Guaifenesin Er] Anaphylaxis  . Rocephin [Ceftriaxone Sodium In Dextrose] Anaphylaxis and Rash  . Rotateq [Rotavirus Vaccine Live Oral] Other (See Comments)    "poorly tolerated"  . Sulfa Antibiotics Other (See Comments)    Pt does not remember specific reaction  . Vancomycin Other (See Comments)    Pt does not remember specific reaction  . Penicillins Rash    Childhood reaction Has patient had a PCN reaction causing immediate rash, facial/tongue/throat swelling, SOB or lightheadedness with hypotension: Yes Has patient had a PCN reaction causing severe rash involving mucus membranes or skin necrosis: No Has patient had a PCN reaction that required hospitalization: Unknown Has patient had a PCN reaction occurring within the last 10 years: No If all of the above answers are "NO", then may proceed with Cephalosporin use.  . Tobramycin Sulfate Rash          Medications Prior to Admission  Medication Sig Dispense Refill  . acetaminophen (TYLENOL) 500 MG tablet Take 1,000 mg by mouth every 6 (six) hours as needed for headache (pain).    Marland Kitchen albuterol (PROAIR HFA) 108 (90 Base) MCG/ACT inhaler Inhale 1-2 puffs into the lungs every 4 (four) hours as needed for wheezing or shortness of breath.    . Calcium Carbonate-Vitamin D (CALCIUM-D PO) Take 1 tablet by mouth at bedtime.    . ciprofloxacin (CIPRO) 500 MG tablet Take 500 mg by mouth See admin instructions. Take 1 tablet (500 mg) by mouth twice daily for 10 days as needed for infection    . clopidogrel (PLAVIX) 75 MG tablet Take 75 mg by mouth at bedtime.      Marland Kitchen diltiazem (CARDIZEM LA) 240 MG 24 hr tablet Take 1 tablet (240 mg total) by mouth daily. (Patient taking differently: Take 240 mg by mouth at bedtime. ) 30 tablet 0  . EPINEPHrine (EPIPEN) 0.3 mg/0.3 mL SOAJ injection Inject 0.3 mg into the muscle once as needed (severe allergic reaction).     . furosemide (LASIX) 20 MG tablet Take 20 mg by mouth daily as needed for fluid or edema.    Marland Kitchen HYDROcodone-homatropine (HYCODAN) 5-1.5 MG/5ML syrup Take 5 mLs by mouth at bedtime as needed for cough.    Marland Kitchen ipratropium-albuterol (DUONEB) 0.5-2.5 (3) MG/3ML SOLN Take 3 mLs by nebulization 3 (three) times daily.     . Multiple Vitamin (MULTIVITAMIN WITH MINERALS) TABS tablet Take 1 tablet by mouth daily.    Marland Kitchen omeprazole (PRILOSEC) 20 MG capsule Take 20 mg by mouth 2 (two) times daily.    . OXYGEN Inhale 2 L into the lungs at bedtime.    . predniSONE (DELTASONE) 10 MG tablet Take 10 mg by mouth every other day.    Marland Kitchen  PRESCRIPTION MEDICATION Inject into the muscle See admin instructions. 2 allergy shots weekly by Dr. Fransico Michael, [place], [place]    . salmeterol (SEREVENT DISKUS) 50 MCG/DOSE diskus inhaler Inhale 1 puff into the lungs 2 (two) times daily as needed (shortness of breath).     . Vitamin D, Ergocalciferol, (DRISDOL) 50000 UNITS CAPS capsule Take 50,000 Units by mouth See admin instructions. Take 1 capsule (50,000 units) by mouth every other Saturday    . [DISCONTINUED] Triamcinolone Acetonide (AZMACORT IN) Inhale 2 puffs into the lungs 4 (four) times daily as needed (shortness of breath).      Home: Home Living Family/patient expects to be discharged to:: Private residence Living Arrangements: Spouse/significant other Available Help at Discharge: Family, Available 24 hours/day Type of Home: House Home Access: Stairs to enter [employer] of Steps: 1 Entrance Stairs-Rails: None Home Layout: One level Bathroom Shower/Tub: Chiropodist:  Handicapped height Bathroom Accessibility: Yes Home Equipment: Wheelchair - manual, Environmental consultant - 2 wheels, [place] - single point, Tub bench (items belonged to mother & sister (deceased))  Functional History: Prior Function Level of Independence: Independent with assistive device(s) Comments: Per pt's family, she was independent with ADLs and IADLs several months ago, and has progressively gotten worse. Pt stated in June she was mowing lawns Functional Status:  Mobility: Bed Mobility General bed mobility comments: pt OOB in recliner upon arrival Transfers Overall transfer level: Needs assistance Equipment used: Rolling walker (2 wheeled) Transfers: Sit to/from Stand Sit to Stand: Min assist, +2 safety/equipment General transfer comment: increased time and effort to complete; VCs for UE placement and to scoot forward with good LE positioning for stand. Repetitive VCs to keep eyes open. Better descent to chair today.  Ambulation/Gait Ambulation/Gait assistance: Mod assist, +2 physical assistance Ambulation Distance (Feet): 10 Feet Assistive device: Rolling walker (2 wheeled) Gait Pattern/deviations: Step-to pattern, Decreased stance time - right, Decreased step length - right, Decreased step length - left, Decreased dorsiflexion - right, Decreased dorsiflexion - left, Decreased weight shift to right, Trunk flexed General Gait Details: slow; increased time and effort to take very few steps again today. required repetitive VCs to keep eyes open and to achieve appropriate step length. +2 for pushing chair behind during amb Gait velocity: decreased Gait velocity interpretation: <1.8 ft/sec, indicative of risk for recurrent falls  ADL: ADL Overall ADL's : Needs assistance/impaired Eating/Feeding: Minimal assistance, Sitting Eating/Feeding Details (indicate cue type and reason): Pt able to self feed with L hand with min A to open lids and containers Grooming: Oral care, Moderate assistance,  Standing Grooming Details (indicate cue type and reason): Mod A +2 to maintain balance at sink. Mod A to use R hand while performing oral care. Mod A to support RUE at elbow and wrist; assist to bring hand back and forth when brushing teeth instead of moving head.  Upper Body Bathing: Moderate assistance, Sitting Lower Body Bathing: Maximal assistance, Sit to/from stand Upper Body Dressing : Moderate assistance, Sitting Lower Body Dressing: Maximal assistance, Sit to/from stand Functional mobility during ADLs: Minimal assistance, +2 for safety/equipment, Rolling walker, Cueing for sequencing, Cueing for safety General ADL Comments: Pt demonstrating decreased functional performance. RUE with decreased grasp strength, coorination, ROM, and fucntional use. Pt with decreased standing balance  Cognition: Cognition Overall Cognitive Status: Impaired/Different from baseline Orientation Level: Oriented X4 Cognition Arousal/Alertness: Lethargic (Difficulty keeping her eyes open) Behavior During Therapy: Flat affect Overall Cognitive Status: Impaired/Different from baseline Area of Impairment: Problem solving, Following commands, Attention, Awareness, Safety/judgement  Current Attention Level: Sustained Following Commands: Follows one step commands consistently, Follows one step commands with increased time Safety/Judgement: Decreased awareness of safety Awareness: Intellectual Problem Solving: Slow processing, Requires verbal cues, Requires tactile cues General Comments: Pt requiring tactile and verbal cues for grooming at sink and safe transfer technqiues.  Blood pressure (!) 139/42, pulse (!) 57, temperature 98.2 F (36.8 C), temperature source Oral, resp. rate 16, height 5\' 6"  (1.676 m), weight 88.4 kg (194 lb 14.2 oz), SpO2 98 %. Physical Exam  Nursing note and vitals reviewed. Constitutional: She is oriented to person, place, and time. She appears well-developed and well-nourished.  HENT:   Head: Normocephalic and atraumatic.  Eyes: Pupils are equal, round, and reactive to light. Conjunctivae are normal.  Neck: Normal range of motion. Neck supple.  Cardiovascular: Normal rate.  An irregular rhythm present.  Respiratory: Effort normal. No stridor. No respiratory distress. She has decreased breath sounds. She has no wheezes. She exhibits no tenderness.  GI: Soft. Bowel sounds are normal. She exhibits no distension. There is no tenderness.  Musculoskeletal: She exhibits no edema or tenderness.  Neurological: She is alert and oriented to person, place, and time.  Kept eyes closed due to HA and light sensitivity. Speech clear. Able to answer orientation questions without difficulty and follow simple motor commands. Nystagmus in lateral fields with dizziness. Right hemiparesis with decreased sensation to light touch.   Skin: Skin is warm and dry.  Motor strength is 5/5 in the left deltoid, bicep, tricep, grip, hip flexor, knee extensor, ankle dorsiflexor. Trace right elbow flexion, 0 at the deltoid, 0 at the anger flexors and extensors, 2 minus hip, knee extensor synergy to minus at the ankle plantar flexor, dorsiflexor  Lab Results Last 24 Hours  No results found for this or any previous visit (from the past 24 hour(s)).   Imaging Results (Last 48 hours)  No results found.    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 Does the need for close, 24 hr/day medical supervision in concert with the patient's rehab needs make it unreasonable for this patient to be served in a less intensive setting? Yes 2. Co-Morbidities requiring supervision/potential complications: Atrial fibrillation, emphysema 3. Due to bladder management, bowel management, safety, skin/wound care, disease management, medication administration, pain management and patient education, does the patient require 24 hr/day rehab nursing? Yes 4. Does the patient  require coordinated care of a physician, rehab nurse, PT (1-2 hrs/day, 5 days/week), OT (1-2 hrs/day, 5 days/week) and SLP (.5-1 hrs/day, 5 days/week) to address physical and functional deficits in the context of the above medical diagnosis(es)? Yes Addressing deficits in the following areas: balance, endurance, locomotion, strength, transferring, bowel/bladder control, bathing, dressing, feeding, grooming, toileting and psychosocial support 5. Can the patient actively participate in an intensive therapy program of at least 3 hrs of therapy per day at least 5 days per week? Yes 6. The potential for patient to make measurable gains while on inpatient rehab is good 7. Anticipated functional outcomes upon discharge from inpatient rehab are supervision  with PT, supervision with OT, supervision with SLP. 8. Estimated rehab length of stay to reach the above functional goals is: 14-16d 9. Anticipated D/C setting: Home 10. Anticipated post D/C treatments: [place] therapy 11. Overall Rehab/Functional Prognosis: fair  RECOMMENDATIONS: This patient's condition is appropriate for continued rehabilitative care in the following setting: CIR Patient has agreed to participate in recommended program. Yes Note that insurance prior authorization may be  required for reimbursement for recommended care.  Comment: Await oncology consultation to establish treatment plan as this may impact her rehabilitation   Charlett Blake M.D. [employer] FAAPM&R (Sports Med, Neuromuscular Med) Diplomate Am Board of Electrodiagnostic Med  Flora Lipps 01/04/2017    Revision History                        Routing History

## 2017-01-11 NOTE — Progress Notes (Signed)
Retta Diones, RN Rehab Admission Coordinator Addendum Physical Medicine and Rehabilitation  Progress Notes Date of Service: 01/10/2017 1:50 PM  Related encounter: Admission (Discharged) from 01/09/2017 in [facility]       [] Hide copied text Rehab admissions:  Patient was admitted to inpatient rehabilitation on 01/07/17 after resection of a brain tumor . Patient was doing well. She developed lower leg pain and swelling. Vascular ultrasound showed DVT. Patient agreeable to [place] filter placement. PM&R provider spoke to interventional radiologist who advised transfer back to acute care setting and procedure to take place next day.  Patient was transferred off CIR and admitted to acute hospital at 1707 on 01/09/17. She underwent filter placement earlier today, 01/10/17.  She is now stable from procedure.  She continues to meet criteria for acute inpatient rehab stay.  We will re-admit to inpatient rehab today.  This is an interrupted stay for inpatient rehab admission.  I have discussed the above with patient and her husband and they are in agreement to re-admit to CIR.  Bed available today.  Call me for questions.  #[phone]    Revision History

## 2017-01-11 NOTE — Progress Notes (Signed)
[place] PHYSICAL MEDICINE & REHABILITATION     PROGRESS NOTE    Subjective/Complaints: Had a good night. Has tolerated therapy well today. Denies pain.  ROS: pt denies nausea, vomiting, diarrhea, cough, shortness of breath or chest pain   Objective: Vital Signs: Blood pressure (!) 123/51, pulse 75, temperature 98.5 F (36.9 C), temperature source Oral, resp. rate 18, height '5\' 5"'  (1.651 m), weight 79 kg (174 lb 3.2 oz), SpO2 97 %. Ir Ivc Filter Plmt / S&i /img Guid/mod Sed  Result Date: 01/10/2017 INDICATION: 76 year old female with history of DVT. 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 ANESTHESIA/SEDATION: 1.0 mg IV Versed; 50 mcg IV Fentanyl Moderate Sedation Time:  25 minutes The patient was continuously monitored during the procedure by the interventional radiology nurse under my direct supervision. FLUOROSCOPY TIME:  Fluoroscopy Time: 3 minutes 54 seconds (21.4 mGy). COMPLICATIONS: None PROCEDURE: The procedure, risks, benefits, and alternatives were explained to the patient. Specific risks discussed include bleeding, infection, contrast reaction, renal failure, IVC filter fracture, migration, ileo caval thrombus (3% incidence), need for further procedure, need for further surgery, pulmonary embolism, cardiopulmonary collapse, death. Questions regarding the procedure were encouraged and answered. The patient understands and consents to the procedure. Ultrasound survey was performed with images stored and sent to PACs. The neck was prepped with Betadine in a sterile fashion, and a sterile drape was applied covering the operative field. A sterile gown and sterile gloves were used for the procedure. Local anesthesia was provided with 1% Lidocaine. A micropuncture needle was used access the right internal jugular vein under ultrasound. With excellent venous  blood flow returned, and an .018 micro wire was passed through the needle. Small incision was made with an 11 blade scalpel. The needle was removed, and a micropuncture sheath was placed over the wire. The inner dilator and wire were removed, and an 035 Bentson wire was advanced under fluoroscopy into the IVC. Serial dilation of the soft tissue tract was performed with an 8 [place] dilator and subsequently a 10 [place] dilator. The delivery sheath for a retrievable Bard Denali filter was passed over the Bentson wire into the IVC. The wire was removed and small contrast was used to confirm IVC location. IVC cavagram performed. Dilator was removed, and the IVC filter was then delivered, with proximal migration such that the wall contact encroached upon the inflow from the right renal vein. A Bard retrieval kit was then passed over the guidewire for repositioning. The cap of the filter was easily captured with the gooseneck snare, and the filter was repositioned to a slightly lower position at the L3 level. Final position below the lowest renal vein. Repeat cavagram performed, and the catheter/wire/sheath was removed. Manual pressure was used for hemostasis. Patient tolerated the procedure well and remained hemodynamically stable throughout. No complications were encountered and no significant blood loss was encounter. IMPRESSION: Status post IVC filter placement via right IJ approach. Signed, Dulcy Fanny. Earleen Newport DO [facility] Radiology PLAN: This IVC filter is potentially retrievable. The patient can be assessed for filter retrieval by Interventional Radiology in approximately 8-12 weeks, or if the patient is a candidate for anti coagulation. Further recommendations regarding filter retrieval, continued surveillance or declaration of device permanence, will be made at that time. Electronically Signed   By: Corrie Mckusick D.O.   On: 01/10/2017 10:18   Ir Ivc Filter  Reposition / S&i /img Guid/mod Sed  Result Date: 01/10/2017 INDICATION: 76 year old female with history of DVT. 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 ANESTHESIA/SEDATION: 1.0 mg IV Versed; 50 mcg IV Fentanyl Moderate Sedation Time:  25 minutes The patient was continuously monitored during the procedure by the interventional radiology nurse under my direct supervision. FLUOROSCOPY TIME:  Fluoroscopy Time: 3 minutes 54 seconds (21.4 mGy). COMPLICATIONS: None PROCEDURE: The procedure, risks, benefits, and alternatives were explained to the patient. Specific risks discussed include bleeding, infection, contrast reaction, renal failure, IVC filter fracture, migration, ileo caval thrombus (3% incidence), need for further procedure, need for further surgery, pulmonary embolism, cardiopulmonary collapse, death. Questions regarding the procedure were encouraged and answered. The patient understands and consents to the procedure. Ultrasound survey was performed with images stored and sent to PACs. The neck was prepped with Betadine in a sterile fashion, and a sterile drape was applied covering the operative field. A sterile gown and sterile gloves were used for the procedure. Local anesthesia was provided with 1% Lidocaine. A micropuncture needle was used access the right internal jugular vein under ultrasound. With excellent venous blood flow returned, and an .018 micro wire was passed through the needle. Small incision was made with an 11 blade scalpel. The needle was removed, and a micropuncture sheath was placed over the wire. The inner dilator and wire were removed, and an 035 Bentson wire was advanced under fluoroscopy into the IVC. Serial dilation of the soft tissue tract was performed with an 8 [place] dilator and subsequently a 10 [place] dilator. The delivery sheath for a  retrievable Bard Denali filter was passed over the Bentson wire into the IVC. The wire was removed and small contrast was used to confirm IVC location. IVC cavagram performed. Dilator was removed, and the IVC filter was then delivered, with proximal migration such that the wall contact encroached upon the inflow from the right renal vein. A Bard retrieval kit was then passed over the guidewire for repositioning. The cap of the filter was easily captured with the gooseneck snare, and the filter was repositioned to a slightly lower position at the L3 level. Final position below the lowest renal vein. Repeat cavagram performed, and the catheter/wire/sheath was removed. Manual pressure was used for hemostasis. Patient tolerated the procedure well and remained hemodynamically stable throughout. No complications were encountered and no significant blood loss was encounter. IMPRESSION: Status post IVC filter placement via right IJ approach. Signed, Dulcy Fanny. Earleen Newport DO [facility] Radiology PLAN: This IVC filter is potentially retrievable. The patient can be assessed for filter retrieval by Interventional Radiology in approximately 8-12 weeks, or if the patient is a candidate for anti coagulation. Further recommendations regarding filter retrieval, continued surveillance or declaration of device permanence, will be made at that time. Electronically Signed   By: Corrie Mckusick D.O.   On: 01/10/2017 10:18    Recent Labs  01/10/17 0518 01/11/17 0545  WBC 20.3* 14.7*  HGB 10.4* 11.3*  HCT 32.0* 35.5*  PLT 114* PENDING    Recent Labs  01/10/17 0518 01/11/17 0545  NA 139 139  K 4.5 3.7  CL 106 104  GLUCOSE 96 74  BUN 33* 32*  CREATININE 0.94 0.97  CALCIUM 8.8* 8.8*   CBG (last 3)  No results for input(s): GLUCAP in the last 72 hours.  Wt Readings from Last 3 Encounters:  01/11/17 79 kg (174 lb 3.2 oz)  01/10/17 81.8 kg (180 lb 6.4 oz)  01/09/17 77.7 kg  (171 lb 3.2 oz)    Physical Exam:  Constitutional: She is oriented to person, place, and time. She appears well-developedand well-nourished. No distress.  HENT:  Head: Normocephalicand atraumatic.  Eyes: EOMI. No discharge.  Neck: Normal range of motion. Neck supple.  Cardiovascular:RRR without murmur. No JVD  . Respiratory: CTA Bilaterally without wheezes or rales. Normal effort   GI: BS +, non-tender, non-distended   Musculoskeletal: She exhibits edema. She exhibits no tenderness.  Neurological: She is alertand oriented to person, place, and time.  Speech clear.  Normal expression. Intact cognition/awareness. Sensory exam normal.   RUE 4 to 4+/5 deltoid, biceps, triceps, wrist, HI.  RLE: 3+/5 HF, KE, 2/5 ADF/PF.  Skin: Skin is warmand dry. She is not diaphoretic.  Psychiatric: She has a normal mood and affect. Her behavior is normal. Thought contentnormal.   Assessment/Plan: 1. 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 Physiatrist is providing close team supervision and 24 hour management of active medical problems listed below. Physiatrist and rehab team continue to assess barriers to discharge/monitor patient progress toward functional and medical goals.  Function:  Bathing Bathing position      Bathing parts      Bathing assist        Upper Body Dressing/Undressing Upper body dressing                    Upper body assist        Lower Body Dressing/Undressing Lower body dressing                                  Lower body assist        Toileting Toileting     Toileting steps completed by helper: Adjust clothing prior to toileting, Performs perineal hygiene, Adjust clothing after toileting    Toileting assist Assist level: Two helpers   Transfers Chair/bed Occupational hygienist          Cognition Comprehension    Expression    Social Interaction    Problem Solving    Memory     Medical Problem List and Plan: 1. Functional deficits and right hemiparesissecondary to metastatic lung cancer to the brain. -Resume therapies today  -team conference today 2. DVT left gastroc: Mechanical: Sequential compression devices, below knee Bilateral lower extremities, IVC filter 3. Headaches/Pain Management: Headaches persistent and being managed with prn hydocodone.  4. Mood: LCSW to follow for evaluation and support.  5. Neuropsych: This patient iscapable of making decisions on herown behalf. 6. Skin/Wound Care: routine pressure relief measures. Cont diet- GI issues may be due to steroids and poor intake of liquid diet.  7. Fluids/Electrolytes/Nutrition: Monitor I/O. BUN 32  -continue to push fluids  -I personally reviewed the patient's labs today.    -D2 thins 8. Nausea: Likely central. Continue full liquid diet. Zofran not effective--continue promethizine prn.  9. H/o A Fib: Managed with diltiazem. Monitor HR bid.  10. Emphysema/Bronchiectasis: continue nebulizers.  chest PT bid as at home. Continue oxygen at nights and prn during the day.  11. Leucocytosis: Monitor for signs of infection.   -likely steroid related   LOS (Days) 1 A Lares T, MD 01/11/2017 9:46 AM

## 2017-01-11 NOTE — Plan of Care (Signed)
Problem: RH SKIN INTEGRITY Goal: RH STG SKIN FREE OF INFECTION/BREAKDOWN Skin to remain clean, dry, and free of infection or breakdown while on rehab with min assist.  Outcome: Not Progressing Area of redness under left breast, PA notified. Nystatin powder ordered

## 2017-01-11 NOTE — Progress Notes (Signed)
Physical Therapy Assessment and Plan  Patient Details  Name: Tracy Bridges MRN: [id number] Date of Birth: 1941-01-19  PT Diagnosis: Coordination disorder, Difficulty walking, Hemiplegia dominant, Impaired sensation and Muscle weakness Rehab Potential: Good ELOS: 12-14 days   Today's Date: 01/11/2017 PT Individual [id number] PT Individual Time Calculation (min): 75 min Problem List:      Patient Active Problem List   Diagnosis Date Noted  . Metastatic cancer to brain ([place]) 01/07/2017  . Brain tumor ([place]) 12/30/2016  . 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 Atrial fibrillation ([place]) 12/26/2016  . Emphysema/COPD ([place]) 12/26/2016  . Brain mass 12/26/2016  . Paresthesia of right arm 12/26/2016  . 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 Right sided weakness   . History of lung cancer   . GERD (gastroesophageal reflux disease) 12/03/2014    Past Medical History:      Past Medical History:  Diagnosis Date  . Asthma   . Atrial fibrillation ([place])   . Emphysema/COPD ([place])   . Lung cancer [facility])    Past Surgical History:       Past Surgical History:  Procedure Laterality Date  . ABDOMINAL HYSTERECTOMY    . APPLICATION OF CRANIAL NAVIGATION Left 12/30/2016   Procedure: APPLICATION OF CRANIAL NAVIGATION;  Surgeon: Ashok Pall, MD;  Location: [place];  Service: Neurosurgery;  Laterality: Left;  APPLICATION OF CRANIAL NAVIGATION  . CESAREAN SECTION    . CRANIOTOMY Left 12/30/2016   Procedure: CRANIOTOMY TUMOR EXCISION;  Surgeon: Ashok Pall, MD;  Location: [place];  Service: Neurosurgery;  Laterality: Left;  CRANIOTOMY TUMOR EXCISION  . HERNIA REPAIR    . LUNG REMOVAL, PARTIAL  04/1997    Assessment & Plan Clinical Impression: Patient is a 76 y.o.femalewith history of A fib, emphysema, bronchiectasis?--uses percussion vest and nebs tid, lung cancer who was admitted on 12/26/16 with abnormalRUE motor activity. Patient with 6 week history of progressive  right sided weakness with foot drop, headaches and nausea but unable to tolerate MRI for work up. Head CT done revealing She was as well as large ovoid mass in high left frontal lobe with vasogenic edema. 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 She was started on Keppra as well as steroids. Eliquis placed on hold and patient transitioned to IV heparin. CT abdomen/pelvis done showing no evidence of metastatic disease and incidental findings of left thyroid nodule and moderate size HH. She underwent craniotomy for tumor resection on 12/31/16 by Dr. Christella Noa. Pathology revealed metastatic lung cancerand Dr. Alen Blew consulted for input. He reheme/onc input pending. Patient with resultant right hemiplegia, lethargy, as well as deficits in processing and following commands. CIR recommended due to deficits in mobility and ability to carry out ADL tasks. Patient transferred to CIR on 01/07/2017 .   Patient currently requires max with mobility secondary to muscle weakness, decreased cardiorespiratoy endurance, decreased coordination and decreased standing balance, decreased postural control, hemiplegia and decreased balance strategies.  Prior to hospitalization, patient was modified independent  with mobility and lived with Spouse in a House home.  Home access is 3 steps spaced outStairs to enter.  Patient will benefit from skilled PT intervention to maximize safe functional mobility, minimize fall risk and decrease caregiver burden for planned discharge home with 24 hour assist.  Anticipate patient will benefit from follow up OP at discharge.  PT - End of Session Activity Tolerance: Tolerates 10 -  20 min activity with multiple rests Endurance Deficit: Yes Endurance Deficit Description: decreased PT Assessment Rehab Potential (ACUTE/IP ONLY): Good PT Barriers to Discharge: Inaccessible home  environment;Home environment access/layout PT Barriers to Discharge Comments: Pt reports husband able to provide Min A physically at home, has been providing that level of assistance for a few months. Son and daughter both live locally as well.  PT Patient demonstrates impairments in the following area(s): Balance;Perception;Endurance;Motor;Pain;Sensory;Safety PT Transfers Functional Problem(s): Bed Mobility;Bed to Chair;Car;Furniture;Floor PT Locomotion Functional Problem(s): Wheelchair Mobility;Ambulation;Stairs PT Plan PT Intensity: Minimum of 1-2 x/day ,45 to 90 minutes PT Frequency: 5 out of 7 days PT Duration Estimated Length of Stay: 12-14 days PT Treatment/Interventions: Ambulation/gait training;Disease management/prevention;Pain management;Stair training;Visual/perceptual remediation/compensation;Wheelchair propulsion/positioning;Therapeutic Activities;Patient/family education;DME/adaptive equipment instruction;Balance/vestibular training;Cognitive remediation/compensation;Functional electrical stimulation;Psychosocial support;Therapeutic Exercise;UE/LE Strength taining/ROM;Skin care/wound management;Functional mobility training;Community reintegration;Discharge planning;Neuromuscular re-education;Splinting/orthotics;UE/LE Coordination activities PT Transfers Anticipated Outcome(s): supervision PT Locomotion Anticipated Outcome(s): supervision PT Recommendation Follow Up Recommendations: Outpatient PT (OPPT if pt performs safe car transfer) Patient destination: Home Equipment Recommended: To be determined Equipment Details: already has rollator  Skilled Therapeutic Intervention  Re-Evaluation completed this session after return from DVT procedure (see details above and below) with education on PT POC and goals and individual treatment initiated with focus on functional mobility and R LE strengthening. Pt performed stand pivot transfers with mod assist using RW. Pt performed sit<>stands  x 4 from recliner and w/c with mod assist. Pt performed sit<>stands from elevated mat x 5 with min assist for R LE NMR, focus on equal weightbearing. Pt ambulated x 15 ft with RW and mod assist, facilitation for R knee extension/knee block during stance phase. Pt left seated in recliner at end of session with needs in reach.    PT Evaluation Precautions/Restrictions Precautions Precautions: None Restrictions Weight Bearing Restrictions: No General   Vital SignsTherapy Vitals Temp: 99.1 F (37.3 C) Temp Source: Oral Pulse Rate: 66 Resp: 18 BP:119/64 Patient Position (if appropriate): Lying Oxygen Therapy SpO2: 95 % O2 Device: Not Delivered Home Living/Prior Functioning Home Living Available Help at Discharge: Family;Available 24 hours/day Type of Home: House Home Access: Stairs to enter [employer] of Steps: 3 steps spaced out Entrance Stairs-Rails: None Home Layout: One level Bathroom Shower/Tub: Chiropodist: Handicapped height Bathroom Accessibility: Yes Additional Comments: pt had tub bench previously that did not work w/ tub at home  Lives With: Spouse Prior Function Level of Independence: Needs assistance with ADLs;Needs assistance with gait;Needs assistance with homemaking  Able to Take Stairs?: Yes Driving: No Leisure: Hobbies-yes (Comment) Comments: Pt was indep as of June, slow progression to require assistance w/ ADLs, IADLs, and gait (Min guard to HHA/supervision). Enjoys gardening and tending to flower bed.  Vision/Perception  Perception Perception: Within Functional Limits Praxis Praxis: Intact  Cognition Arousal/Alertness: Awake/alert Orientation Level: Oriented X4 Attention: Sustained Sustained Attention: Appears intact Memory: Appears intact Awareness: Appears intact Problem Solving: Appears intact Safety/Judgment: Appears intact Sensation Sensation Light Touch: Impaired by gross assessment (impaired sensation  RLE) Proprioception: Appears Intact Coordination Gross Motor Movements are Fluid and Coordinated: No Fine Motor Movements are Fluid and Coordinated: No Motor  Motor Motor: Hemiplegia Motor - Skilled Clinical Observations: R hemiplegia   Trunk/Postural Assessment  Cervical Assessment Cervical Assessment: Within Functional Limits Thoracic Assessment Thoracic Assessment: Exceptions to [facility] (increased kyphosis) Lumbar Assessment Lumbar Assessment: Within Functional Limits Postural Control Postural Control: Within Functional Limits  Balance Balance Balance Assessed: Yes Static Sitting Balance Static Sitting - Balance Support: Bilateral upper extremity supported;Feet supported  Static Sitting - Level of Assistance: 5: Stand by assistance Dynamic Sitting Balance Dynamic Sitting - Level of Assistance: 5: Stand by assistance (dressing) Static Standing Balance Static Standing - Balance Support: Bilateral upper extremity supported;During functional activity Static Standing - Level of Assistance: 4: Min assist Extremity Assessment  RLE Assessment RLE Assessment: Exceptions to [facility] (2 to 3-/5 globally) LLE Assessment LLE Assessment: Within Functional Limits   See Function Navigator for Current Functional Status.   Refer to Care Plan for Long Term Goals  Recommendations for other services: None   Discharge Criteria: Patient will be discharged from PT if patient refuses treatment 3 consecutive times without medical reason, if treatment goals not met, if there is a change in medical status, if patient makes no progress towards goals or if patient is discharged from hospital.  The above assessment, treatment plan, treatment alternatives and goals were discussed and mutually agreed upon: by patient and by family    Netta Corrigan, PT, DPT 01/11/2017, 1:00 PM

## 2017-01-12 ENCOUNTER — Inpatient Hospital Stay (HOSPITAL_COMMUNITY): Payer: Medicare HMO | Admitting: Physical Therapy

## 2017-01-12 ENCOUNTER — Inpatient Hospital Stay (HOSPITAL_COMMUNITY): Payer: Medicare HMO | Admitting: Occupational Therapy

## 2017-01-12 ENCOUNTER — Inpatient Hospital Stay (HOSPITAL_COMMUNITY): Payer: Medicare HMO | Admitting: Speech Pathology

## 2017-01-12 ENCOUNTER — Telehealth: Payer: Self-pay

## 2017-01-12 ENCOUNTER — Inpatient Hospital Stay (HOSPITAL_COMMUNITY): Payer: Medicare HMO | Admitting: *Deleted

## 2017-01-12 ENCOUNTER — Inpatient Hospital Stay (HOSPITAL_COMMUNITY): Payer: Medicare HMO

## 2017-01-12 DIAGNOSIS — I824Z2 Acute embolism and thrombosis of unspecified deep veins of left distal lower extremity: Secondary | ICD-10-CM

## 2017-01-12 DIAGNOSIS — R7989 Other specified abnormal findings of blood chemistry: Secondary | ICD-10-CM

## 2017-01-12 MED ORDER — SENNOSIDES-DOCUSATE SODIUM 8.6-50 MG PO TABS
2.0000 | ORAL_TABLET | Freq: Every day | ORAL | Status: DC
  Administered 2017-01-12: 2 via ORAL
  Filled 2017-01-12: qty 2

## 2017-01-12 NOTE — Evaluation (Signed)
Recreational Therapy Assessment and Plan  Patient Details  Name: Tracy Bridges MRN: [id number] Date of Birth: 1940-06-22 Today's Date: 01/12/2017  Rehab Potential: Good ELOS: 2 weeks   Assessment  Problem List:     Patient Active Problem List   Diagnosis Date Noted  . Metastatic cancer to brain ([place]) 01/07/2017  . Brain tumor ([place]) 12/30/2016  . Focal seizure ([place])   . Atrial fibrillation ([place]) 12/26/2016  . Emphysema/COPD ([place]) 12/26/2016  . Brain mass 12/26/2016  . Paresthesia of right arm 12/26/2016  . 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 Right sided weakness   . History of lung cancer   . GERD (gastroesophageal reflux disease) 12/03/2014    Past Medical History:     Past Medical History:  Diagnosis Date  . Asthma   . Atrial fibrillation ([place])   . Emphysema/COPD ([place])   . Lung cancer [facility])    Past Surgical History:      Past Surgical History:  Procedure Laterality Date  . ABDOMINAL HYSTERECTOMY    . APPLICATION OF CRANIAL NAVIGATION Left 12/30/2016   Procedure: APPLICATION OF CRANIAL NAVIGATION; Surgeon: Ashok Pall, MD; Location: [place]; Service: Neurosurgery; Laterality: Left; APPLICATION OF CRANIAL NAVIGATION  . CESAREAN SECTION    . CRANIOTOMY Left 12/30/2016   Procedure: CRANIOTOMY TUMOR EXCISION; Surgeon: Ashok Pall, MD; Location: [place]; Service: Neurosurgery; Laterality: Left; CRANIOTOMY TUMOR EXCISION  . HERNIA REPAIR    . LUNG REMOVAL, PARTIAL  04/1997    Assessment & Plan Clinical Impression: Tracy S Jonesis a 76 y.o.femalewith history of A fib, emphysema, bronchiectasis?--uses percussion vest and nebs tid, lung cancer who was admitted on 12/26/16 with abnormalRUE motor activity. Patient with 6 week history of progressive right sided weakness with foot drop, headaches and nausea but unable to tolerate MRI for work up. Head CT done revealing She was as well as large ovoid mass in high  left frontal lobe with vasogenic edema. 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 She was started on Keppra as well as steroids. Eliquis placed on hold and patient transitioned to IV heparin. CT abdomen/pelvis done showing no evidence of metastatic disease and incidental findings of left thyroid nodule and moderate size HH. She underwent craniotomy for tumor resection on 12/31/16 by Dr. Christella Noa. Pathology revealed metastatic lung cancerand Dr. Alen Blew consulted for input. He reheme/onc input pending. Patient with resultant right hemiplegia, lethargy, as well as deficits in processing and following commands. CIR recommended due to deficits in mobility and ability to carry out ADL tasks.   Pt presents with decreased activity tolerance, decreased functional mobility, decreased balance Limiting pt's independence with leisure/community pursuits.   Leisure History/Participation Premorbid leisure interest/current participation: Tracy Bridges;Community - Travel (Comment);Petra Kuba - Flower gardening;Nature - [place] care;Nature - Vegetable gardening Expression Interests: Music (Comment) Other Leisure Interests: Engineer, water) Leisure Participation Style: With Family/Friends Awareness of Community Resources: Excellent Psychosocial / Spiritual Spiritual Interests: [place] Patient agreeable to Pet Therapy: No Does patient have pets?: Yes (2 cats; allergic to dogs) Social interaction - Mood/Behavior: Cooperative Academic librarian Appropriate for Education?: Yes Recreational Therapy Orientation Orientation -Reviewed with patient: Available activity resources Strengths/Weaknesses Patient Strengths/Abilities: Willingness to participate;Active premorbidly Patient weaknesses: Physical limitations TR Patient demonstrates impairments in  the following area(s): Endurance;Motor;Pain;Safety  Plan Rec Therapy Plan Is patient appropriate for Therapeutic Recreation?: Yes Rehab Potential: Good Treatment times per  week: Min 1 TR session/group >25 minutes during LOS Estimated Length of Stay: 2 weeks TR Treatment/Interventions: Adaptive equipment instruction;1:1 session;Balance/vestibular training;Functional mobility training;Community reintegration;Group participation (Comment);Patient/family education;Therapeutic activities;Recreation/leisure participation;Therapeutic exercise;UE/LE Coordination activities;Wheelchair propulsion/positioning  Recommendations for other services: None   Discharge Criteria: Patient will be discharged from TR if patient refuses treatment 3 consecutive times without medical reason.  If treatment goals not met, if there is a change in medical status, if patient makes no progress towards goals or if patient is discharged from hospital.  The above assessment, treatment plan, treatment alternatives and goals were discussed and mutually agreed upon: by patient  Rosenhayn 01/12/2017, 2:47 PM

## 2017-01-12 NOTE — Progress Notes (Signed)
Patient given miralax for bowels in am. Passed two small bms, second one fatty and mucous present. PA notified. Bowel sounds hypoactive. No stool palpated when suppository given yesterday. Will report to oncoming RN

## 2017-01-12 NOTE — Progress Notes (Signed)
Social Work  Social Work Assessment and Plan  Patient Details  Name: Tracy Bridges MRN: [id number] Date of Birth: 05/19/1940  Today's Date: 01/12/2017  Problem List:  Patient Active Problem List   Diagnosis Date Noted  . Prerenal azotemia 01/12/2017  . Vascular headache   . Non-intractable vomiting with nausea   . Leukocytosis   . DVT (deep venous thrombosis) ([place]) 01/09/2017  . Metastatic cancer to brain ([place]) 01/07/2017  . Brain tumor ([place]) 12/30/2016  . Focal seizure ([place])   . Atrial fibrillation ([place]) 12/26/2016  . Emphysema/COPD ([place]) 12/26/2016  . Brain mass 12/26/2016  . Paresthesia of right arm 12/26/2016  . 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 Right sided weakness   . History of lung cancer   . GERD (gastroesophageal reflux disease) 12/03/2014   Past Medical History:  Past Medical History:  Diagnosis Date  . Asthma   . Atrial fibrillation ([place])   . Emphysema/COPD ([place])   . Lung cancer [facility])    Past Surgical History:  Past Surgical History:  Procedure Laterality Date  . ABDOMINAL HYSTERECTOMY    . APPLICATION OF CRANIAL NAVIGATION Left 12/30/2016   Procedure: APPLICATION OF CRANIAL NAVIGATION;  Surgeon: Ashok Pall, MD;  Location: [place];  Service: Neurosurgery;  Laterality: Left;  APPLICATION OF CRANIAL NAVIGATION  . CESAREAN SECTION    . CRANIOTOMY Left 12/30/2016   Procedure: CRANIOTOMY TUMOR EXCISION;  Surgeon: Ashok Pall, MD;  Location: [place];  Service: Neurosurgery;  Laterality: Left;  CRANIOTOMY TUMOR EXCISION  . HERNIA REPAIR    . IR IVC FILTER PLMT / S&I /IMG GUID/MOD SED  01/10/2017  . IR IVC FILTER REPOSITION / S&I /IMG GUID/MOD SED  01/10/2017  . LUNG REMOVAL, PARTIAL  04/1997   Social History:  reports that she quit smoking about 38 years ago. Her smoking use included Cigarettes. She has never used smokeless tobacco. She reports that she does not drink alcohol or use drugs.  Family / Support Systems Marital Status: Married How  Long?: 64 yrs Patient Roles: Spouse, Parent, Other (Comment) (granparent) Spouse/Significant Other: Charleigh Correnti @ (C) [phone] Children: daughter, Farrel Gordon [facility]) @ (C) [phone];  son, Alanii Ramer [facility]) @ ([place] [phone] Other Supports: sister, Perrin Smack @ [phone];  Lanae Crumbly @ [phone] Anticipated Caregiver: Spouse Ability/Limitations of Caregiver: Husband is retired and can assist. Caregiver Availability: 24/7 Family Dynamics: Husband very supportive and able to assist.  Very encouraging to pt.    Social History Preferred language: English Religion: Baptist Cultural Background: NA Read: Yes Write: Yes Employment Status: Retired Freight forwarder Issues: None Guardian/Conservator: None - per MD, pt is capable of making decisions on her own behalf.   Abuse/Neglect Physical Abuse: Denies Verbal Abuse: Denies Sexual Abuse: Denies Exploitation of patient/patient's resources: Denies Self-Neglect: Denies  Emotional Status Pt's affect, behavior adn adjustment status: Pt very pleasant, talkative and extremenly optimistic about her recovery.  She describes herself as "focused on getting better..." and denies any s/s of any emotional distress.  Pt notes that she and her family rely heavily on their faith.   Recent Psychosocial Issues: None Pyschiatric History: None Substance Abuse History: None  Patient / Family Perceptions, Expectations & Goals Pt/Family understanding of illness & functional limitations: pt and spouse with good understanding of her lung mets to brain and of current functional limitations/ need for CIR. Premorbid pt/family roles/activities: Pt was completely independent at home and in the community Anticipated changes in roles/activities/participation: Little change if pt  able to reach the supervision goals (or better).  Spouse to continue to provide assist as needed. Pt/family expectations/goals: "I plan on getting my  strength back and getting home."  [employer]: None Premorbid Home Care/DME Agencies: None Transportation available at discharge: yes  Discharge Planning Living Arrangements: Spouse/significant other Support Systems: Spouse/significant other, Children, Other relatives, Water engineer, Social worker community Type of Residence: Private residence Insurance Resources: Barrister's clerk) Financial Resources: [place] Referred: No Living Expenses: Own Money Management: Spouse Does the patient have any problems obtaining your medications?: No Home Management: pt and spouse Patient/Family Preliminary Plans: Pt to d/c home with spouse able to provide any assistance needed. Expected length of stay: 14-16 days  Clinical Impression Very pleasant woman here following crani for brain mets tumor removal and making good progress.  Excellent support from spouse and family who are able to provide any assistance needed.  Pt optimistic about her prognosis and has strong faith that is her primary supporting element.  Will follow for support and d/c planning needs.  Billiejean Schimek 01/12/2017, 1:57 PM

## 2017-01-12 NOTE — Telephone Encounter (Signed)
Reesa Chew PA at [facility] rehab called. She is requesting the pt wants to see some one at [place] they live in [place]. S/w Hinton Dyer and she will let Seth Bake know and get pt reassigned to [place].  This RN s/w Reesa Chew to let her know.  Pamela's phone is [phone]

## 2017-01-12 NOTE — Progress Notes (Signed)
Occupational Therapy Session Note  Patient Details  Name: Tracy Bridges MRN: [id number] Date of Birth: [birth year]  Today's Date: 01/12/2017 OT Individual Time: 1400-1500 OT Individual Time Calculation (min): 60 min    Short Term Goals: Week 1:  OT Short Term Goal 1 (Week 1): Pt will complete bathing at sit<stand level with Mod A OT Short Term Goal 2 (Week 1): Pt will complete toileting tasks with 1 helper OT Short Term Goal 3 (Week 1): Pt will complete toilet transfer with mod A and LRAD  Skilled Therapeutic Interventions/Progress Updates:    1:1 Therapeutic activity: focus on stand pivot transfers with RW with mod A for sit to stands, and sit to stands with min to modA.  Addressed transitioning from pushing up with bilateral hands to grabbing onto hand rail in hallway or RW. Performed mini squats with attention to activation in right LE.  Pt performed 4 transfers in session; at times multiple trials of getting up before success.   Therapy Documentation Precautions:  Restrictions Weight Bearing Restrictions: No Pain:  no c/o pain just fatigue.  See Function Navigator for Current Functional Status.   Therapy/Group: Individual Therapy  Willeen Cass [facility] 01/12/2017, 4:19 PM

## 2017-01-12 NOTE — Progress Notes (Signed)
Physical Therapy Session Note  Patient Details  Name: Tracy Bridges MRN: [id number] Date of Birth: [birth year]  Today's Date: 01/12/2017 PT Individual Time: 0806-0900 PT Individual Time Calculation (min): 54 min   Short Term Goals: Week 1:  PT Short Term Goal 1 (Week 1): (P) Pt will perform sit<>stand with min assist consistently PT Short Term Goal 2 (Week 1): (P) Pt will ambulate 50 ft with RW and min assist  Skilled Therapeutic Interventions/Progress Updates:    Pt missed first 6 min of session due to respiratory breathing treatment. Focused on functional mobility including bed mobility, sit <> stands, toilet transfers, and overall endurance/strengthening. Pt requires min assist to come to EOB with assist for RLE advancement. Request to use bathroom prior to donning pants. Required 3 attempts for sit <> stand with mod assist and cues for hand placement/technique from EOB - small steps to [facility] with decreased stance control in RLE noted but no buckling. Total assist for hygiene with pt utilizing armrests of BSC for clearance of bottom. Tracy Bridges for transfer off of [facility] for sit <> stands for clothing management to increase standing tolerance. Pt with difficulty motor planning sit <> stand technique despite multimodal cues. Pt able to maintain standing in stedy with supervision and perform sit <> stand from perched surface with min assist. W/c mobility in hallway for UE strengthening and and coordination with supervision for cues for technique. Blocked practice sit <> stands and short distance gait with RW x 4' forwards and backwards to work on coordination, strengthening, and motor control. Facilitation for stance control at R knee during gait; increased difficulty with retro-gait though requires overall min assist for gait; min to mod assist for sit <> stands which improved with repetition and cues for trunk extension.   Therapy Documentation Precautions:  Restrictions Weight Bearing  Restrictions: No  Pain: Pain Assessment Pain Assessment: No/denies pain   See Function Navigator for Current Functional Status.   Therapy/Group: Individual Therapy  Tracy Bridges, PT, DPT  01/12/2017, 10:02 AM

## 2017-01-12 NOTE — Progress Notes (Signed)
Physical Therapy Session Note  Patient Details  Name: Tracy Bridges MRN: [id number] Date of Birth: 10/04/1940  Today's Date: 01/12/2017 PT Individual Time: 1605-1700 PT Individual Time Calculation (min): 55 min   Short Term Goals: Week 1:  PT Short Term Goal 1 (Week 1): (P) Pt will perform sit<>stand with min assist consistently PT Short Term Goal 2 (Week 1): (P) Pt will ambulate 50 ft with RW and min assist  Skilled Therapeutic Interventions/Progress Updates:   Pt received sitting in WC and agreeable to PT  Sit<>stand in parallel bars with mod-max assist from PT to facilitate knee extension and anterior weight shift.    Forward/backward walking in parallel bars 4x 8 feet each direction. Side stepping in parallel bars 3x 5 ft each direction.   WC mobility x 50ft with min-mod assist and moderate cues for improved use of the R UE to prevent veer to the R, only slight improvement in RUE ROM and force following instruction.   Squat pivot transfer with mod assist from PT with to Nustep. 'max cues for motor planning and sequencing for transfer.   nustep reciprocal movement training and strengthening. 4 min x 2 with prolonged rest break. Pt rated Borg RPE at 15 and 16 at each break.   Squat pivot transfer back to WC as listed above. Patient returned too room and left sitting in WC with call bell in reach and all needs met.        Therapy Documentation Precautions:  Restrictions Weight Bearing Restrictions: No Vital Signs: Therapy Vitals Temp: 99.2 F (37.3 C) Temp Source: Oral Pulse Rate: (!) 101 Resp: 20 BP: (!) 125/52 Patient Position (if appropriate): Sitting Oxygen Therapy SpO2: 96 % O2 Device: Not Delivered Pain:   0/10   See Function Navigator for Current Functional Status.   Therapy/Group: Individual Therapy  Austin E Tucker 01/12/2017, 5:11 PM  

## 2017-01-12 NOTE — Patient Care Conference (Signed)
Inpatient RehabilitationTeam Conference and Plan of Care Update Date: 01/11/2017   Time: 2:50 PM    Patient Name: Tracy Bridges      Medical Record Number: [id number]  Date of Birth: 1940/05/28 Sex: Female         Room/Bed: 4W26C/4W26C-01 Payor Info: Payor: [employer] MEDICARE / Plan: [place] MEDICARE HMO/PPO / Product Type: *No Product type* /    Admitting Diagnosis: Brain Tumor DVI  Admit Date/Time:  01/10/2017  4:17 PM Admission Comments: No comment available   Primary Diagnosis:  <principal problem not specified> Principal Problem: <principal problem not specified>  Patient Active Problem List   Diagnosis Date Noted  . Vascular headache   . Non-intractable vomiting with nausea   . Leukocytosis   . DVT (deep venous thrombosis) ([place]) 01/09/2017  . Metastatic cancer to brain ([place]) 01/07/2017  . Brain tumor ([place]) 12/30/2016  . Focal seizure ([place])   . Atrial fibrillation ([place]) 12/26/2016  . Emphysema/COPD ([place]) 12/26/2016  . Brain mass 12/26/2016  . Paresthesia of right arm 12/26/2016  . 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 Right sided weakness   . History of lung cancer   . GERD (gastroesophageal reflux disease) 12/03/2014    Expected Discharge Date: Expected Discharge Date: 01/25/17  Team Members Present: Physician leading conference: Dr. Alger Simons Social Worker Present: Lennart Pall, LCSW Nurse Present: Benjie Karvonen, RN PT Present: Lavone Nian, PT OT Present: Benay Pillow, OT SLP Present: Charolett Bumpers, SLP PPS Coordinator present : Daiva Nakayama, RN, CRRN     Current Status/Progress Goal Weekly Team Focus  Medical   metastatic lung cancer to brain with right hemiparesis, left gastroc dvt  increase functional capacity, control pain  dvt rx, electrolyte control, steroid wean/mgt   Bowel/Bladder   Continent of B&B.  Maintain continence.  Assisst with toileting needs as needed.   Swallow/Nutrition/ Hydration   dys 2 and thin  Mod I  trials of dys 3     ADL's   max A for transfers and for sit to stands, some transfers with [name],  bathing with min A, dressing wtih mod A,  supervision overall   d/c planning NMR for right UE/LE, sit to stands, activity tolerance, transfer training    Mobility   Mod assist sit<>stand, mod A stand pivot transfer, Mod A gait up to 15 ft with RW, min assist bed mobility  supervision  activity tolerance, gait training, transfers, balance, R LE NMR   Communication             Safety/Cognition/ Behavioral Observations  Mod- Min A   Supervision A  complex problem solving ( med management, money managment...etc.),    Pain   Pain between 0 and 6 in surgical site.  Keep pain <3.  Assess for pain q shift and PRN.   Skin   Crainiotomy site clean dry and intact and open to air with staples.  Keep site intact and maintain skin integrity.  Assess skin q shift and PRN.    Rehab Goals Patient on target to meet rehab goals: Yes *See Care Plan and progress notes for long and short-term goals.     Barriers to Discharge  Current Status/Progress Possible Resolutions Date Resolved   Physician    Medical stability        IVCF placed, ongoing medical mgt      Nursing  Medical stability;Medication compliance               PT  Inaccessible  home environment;Home environment access/layout  Pt reports husband able to provide Min A physically at home, has been providing that level of assistance for a few months. Son and daughter live locally.               OT                  SLP Medical stability              SW                Discharge Planning/Teaching Needs:  Pt to return home with her spouse who can provide 24/7 assistance.  Teaching to be completed closer to d/c.   Team Discussion:  New eval.  Brain mets s/p crani with tumor removal.  Max assist basic transfers; needed multiple rest breaks; required +2 at end of session due to fatigue.  Cont b/b.  Goals at supervision/ min assist overall.  ST addressing higher  level cognition - supervision goals.  Revisions to Treatment Plan:  None    Continued Need for Acute Rehabilitation Level of Care: The patient requires daily medical management by a physician with specialized training in physical medicine and rehabilitation for the following conditions: Daily direction of a multidisciplinary physical rehabilitation program to ensure safe treatment while eliciting the highest outcome that is of practical value to the patient.: Yes Daily medical management of patient stability for increased activity during participation in an intensive rehabilitation regime.: Yes Daily analysis of laboratory values and/or radiology reports with any subsequent need for medication adjustment of medical intervention for : Post surgical problems;Neurological problems;Other  Tracy Bridges 01/12/2017, 9:26 AM

## 2017-01-12 NOTE — Progress Notes (Signed)
Speech Language Pathology Daily Session Note  Patient Details  Name: PAGE PUCCIARELLI MRN: [id number] Date of Birth: [birth year]  Today's Date: 01/12/2017 SLP Individual Time: 0930-1000 SLP Individual Time Calculation (min): 30 min  Short Term Goals: Week 1: SLP Short Term Goal 1 (Week 1): Pt will tolerate trials of Dys 3 without overt s/s aspiration and supervision A verbal cues to utilize swallow compensatory strategies in order to assess diet advancement. SLP Short Term Goal 2 (Week 1): Pt will demonstrate aniticpatory awareness and identify 3 cognitive tasks she can participate in safely with Min A question cues.  SLP Short Term Goal 3 (Week 1): Pt demonstrate functional problem solving for semi-complex taks with Min A verbal cues.   Skilled Therapeutic Interventions: Skilled ST session focused on dysphagia and cognition goals. SLP facilitated session by providing skilled observation of pt consuming dysphagia 2 with thin liquids. Pt with self-imposed slow rate and "purposeful" swallow d/t fear of vomiting secondary to hx of esophageal dysfunction. Pt reports history of having esophagus "stretched" x 3, severe hiatal hernia and severe ("worse MD has ever seen") reflux. Pt didn't want any additional textures at this point. Pt able to problem solve schedule information and simple recall of previous sessions with Mod I. Pt left upright in wheelchair with husband present and all needs within reach. Continue per current plan of care.      Function:  Eating Eating   Modified Consistency Diet: Yes Eating Assist Level: More than reasonable amount of time (Self-imposed - appears at baseline)           Cognition Comprehension Comprehension assist level: Follows complex conversation/direction with no assist  Expression   Expression assist level: Expresses basic needs/ideas: With no assist  Social Interaction Social Interaction assist level: Interacts appropriately with others - No medications  needed.  Problem Solving Problem solving assist level: Solves basic 90% of the time/requires cueing < 10% of the time  Memory Memory assist level: Recognizes or recalls 90% of the time/requires cueing < 10% of the time    Pain Pain Assessment Pain Assessment: No/denies pain  Therapy/Group: Individual Therapy  Lacrecia Delval 01/12/2017, 11:25 AM

## 2017-01-12 NOTE — Progress Notes (Signed)
[place] PHYSICAL MEDICINE & REHABILITATION     PROGRESS NOTE    Subjective/Complaints: Up in chair. No new complaints. Hasn't moved bowels. Has had better appetite, less nausea over the last 3 days  ROS: pt denies nausea, vomiting, diarrhea, cough, shortness of breath or chest pain   Objective: Vital Signs: Blood pressure (!) 136/49, pulse 88, temperature 98.9 F (37.2 C), temperature source Oral, resp. rate 18, height _0  (1.651 m), weight 78 kg (171 lb 14.4 oz), SpO2 99 %. Ir Ivc Filter Plmt / S&i /img Guid/mod Sed  Result Date: 01/10/2017 INDICATION: 76 year old female with history of DVT. 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 ANESTHESIA/SEDATION: 1.0 mg IV Versed; 50 mcg IV Fentanyl Moderate Sedation Time:  25 minutes The patient was continuously monitored during the procedure by the interventional radiology nurse under my direct supervision. FLUOROSCOPY TIME:  Fluoroscopy Time: 3 minutes 54 seconds (21.4 mGy). COMPLICATIONS: None PROCEDURE: The procedure, risks, benefits, and alternatives were explained to the patient. Specific risks discussed include bleeding, infection, contrast reaction, renal failure, IVC filter fracture, migration, ileo caval thrombus (3% incidence), need for further procedure, need for further surgery, pulmonary embolism, cardiopulmonary collapse, death. Questions regarding the procedure were encouraged and answered. The patient understands and consents to the procedure. Ultrasound survey was performed with images stored and sent to PACs. The neck was prepped with Betadine in a sterile fashion, and a sterile drape was applied covering the operative field. A sterile gown and sterile gloves were used for the procedure. Local anesthesia was provided with 1% Lidocaine. A micropuncture needle was used access the right internal  jugular vein under ultrasound. With excellent venous blood flow returned, and an .018 micro wire was passed through the needle. Small incision was made with an 11 blade scalpel. The needle was removed, and a micropuncture sheath was placed over the wire. The inner dilator and wire were removed, and an 035 Bentson wire was advanced under fluoroscopy into the IVC. Serial dilation of the soft tissue tract was performed with an 8 [place] dilator and subsequently a 10 [place] dilator. The delivery sheath for a retrievable Bard Denali filter was passed over the Bentson wire into the IVC. The wire was removed and small contrast was used to confirm IVC location. IVC cavagram performed. Dilator was removed, and the IVC filter was then delivered, with proximal migration such that the wall contact encroached upon the inflow from the right renal vein. A Bard retrieval kit was then passed over the guidewire for repositioning. The cap of the filter was easily captured with the gooseneck snare, and the filter was repositioned to a slightly lower position at the L3 level. Final position below the lowest renal vein. Repeat cavagram performed, and the catheter/wire/sheath was removed. Manual pressure was used for hemostasis. Patient tolerated the procedure well and remained hemodynamically stable throughout. No complications were encountered and no significant blood loss was encounter. IMPRESSION: Status post IVC filter placement via right IJ approach. Signed, Dulcy Fanny. Earleen Newport DO [facility] Radiology PLAN: This IVC filter is potentially retrievable. The patient can be assessed for filter retrieval by Interventional Radiology in approximately 8-12 weeks, or if the patient is a candidate for anti coagulation. Further recommendations regarding filter retrieval, continued surveillance or declaration of device permanence, will be made at that time. Electronically Signed   By: Corrie Mckusick  D.O.  On: 01/10/2017 10:18   Ir Ivc Filter Reposition / S&i /img Guid/mod Sed  Result Date: 01/10/2017 INDICATION: 76 year old female with history of DVT. 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 ANESTHESIA/SEDATION: 1.0 mg IV Versed; 50 mcg IV Fentanyl Moderate Sedation Time:  25 minutes The patient was continuously monitored during the procedure by the interventional radiology nurse under my direct supervision. FLUOROSCOPY TIME:  Fluoroscopy Time: 3 minutes 54 seconds (21.4 mGy). COMPLICATIONS: None PROCEDURE: The procedure, risks, benefits, and alternatives were explained to the patient. Specific risks discussed include bleeding, infection, contrast reaction, renal failure, IVC filter fracture, migration, ileo caval thrombus (3% incidence), need for further procedure, need for further surgery, pulmonary embolism, cardiopulmonary collapse, death. Questions regarding the procedure were encouraged and answered. The patient understands and consents to the procedure. Ultrasound survey was performed with images stored and sent to PACs. The neck was prepped with Betadine in a sterile fashion, and a sterile drape was applied covering the operative field. A sterile gown and sterile gloves were used for the procedure. Local anesthesia was provided with 1% Lidocaine. A micropuncture needle was used access the right internal jugular vein under ultrasound. With excellent venous blood flow returned, and an .018 micro wire was passed through the needle. Small incision was made with an 11 blade scalpel. The needle was removed, and a micropuncture sheath was placed over the wire. The inner dilator and wire were removed, and an 035 Bentson wire was advanced under fluoroscopy into the IVC. Serial dilation of the soft tissue tract was performed with an 8 [place] dilator and subsequently a 10  [place] dilator. The delivery sheath for a retrievable Bard Denali filter was passed over the Bentson wire into the IVC. The wire was removed and small contrast was used to confirm IVC location. IVC cavagram performed. Dilator was removed, and the IVC filter was then delivered, with proximal migration such that the wall contact encroached upon the inflow from the right renal vein. A Bard retrieval kit was then passed over the guidewire for repositioning. The cap of the filter was easily captured with the gooseneck snare, and the filter was repositioned to a slightly lower position at the L3 level. Final position below the lowest renal vein. Repeat cavagram performed, and the catheter/wire/sheath was removed. Manual pressure was used for hemostasis. Patient tolerated the procedure well and remained hemodynamically stable throughout. No complications were encountered and no significant blood loss was encounter. IMPRESSION: Status post IVC filter placement via right IJ approach. Signed, Dulcy Fanny. Earleen Newport DO [facility] Radiology PLAN: This IVC filter is potentially retrievable. The patient can be assessed for filter retrieval by Interventional Radiology in approximately 8-12 weeks, or if the patient is a candidate for anti coagulation. Further recommendations regarding filter retrieval, continued surveillance or declaration of device permanence, will be made at that time. Electronically Signed   By: Corrie Mckusick D.O.   On: 01/10/2017 10:18    Recent Labs  01/10/17 0518 01/11/17 0545  WBC 20.3* 14.7*  HGB 10.4* 11.3*  HCT 32.0* 35.5*  PLT 114* 99*    Recent Labs  01/10/17 0518 01/11/17 0545  NA 139 139  K 4.5 3.7  CL 106 104  GLUCOSE 96 74  BUN 33* 32*  CREATININE 0.94 0.97  CALCIUM 8.8* 8.8*   CBG (last 3)  No results for input(s): GLUCAP in the last 72  hours.  Wt Readings from Last 3 Encounters:  01/12/17 78 kg (171 lb 14.4 oz)  01/10/17  81.8 kg (180 lb 6.4 oz)  01/09/17 77.7 kg (171 lb 3.2 oz)    Physical Exam:  Constitutional: She is oriented to person, place, and time. She appears well-developedand well-nourished. No distress.  HENT:  Head: Normocephalicand atraumatic.  Eyes: EOMI. No discharge.  Neck: Normal range of motion. Neck supple.  Cardiovascular: RRR without murmur. No JVD  . Respiratory: CTA Bilaterally without wheezes or rales. Normal effort   GI: bowels sounds hypoactive, NT/ND  Musculoskeletal: She exhibits tr LE edema. She exhibits no tenderness.  Neurological: She is alertand oriented to person, place, and time.  Speech clear.  Normal expression. Intact cognition/awareness. Sensory exam normal.   RUE 4 to 4+/5 deltoid, biceps, triceps, wrist, HI. ---stable RLE: 3+/5 HF, KE, 2 to 2+/5 ADF/PF.  Skin: Skin is warmand dry. She is not diaphoretic.  Psychiatric: She has a normal mood and affect. Her behavior is normal. Thought contentnormal.   Assessment/Plan: 1. 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 Physiatrist is providing close team supervision and 24 hour management of active medical problems listed below. Physiatrist and rehab team continue to assess barriers to discharge/monitor patient progress toward functional and medical goals.  Function:  Bathing Bathing position   Position: Shower  Bathing parts Body parts bathed by patient: Right arm, Left arm, Chest, Abdomen, Front perineal area, Buttocks, Right upper leg, Left upper leg Body parts bathed by helper: Right lower leg, Left lower leg, Back  Bathing assist Assist Level: Touching or steadying assistance(Pt > 75%)      Upper Body Dressing/Undressing Upper body dressing   What is the patient wearing?: Pull over shirt/dress     Pull over shirt/dress - Perfomed by patient:  Thread/unthread right sleeve, Thread/unthread left sleeve, Put head through opening Pull over shirt/dress - Perfomed by helper: Pull shirt over trunk        Upper body assist Assist Level: Supervision or verbal cues, Set up   Set up : To obtain clothing/put away  Lower Body Dressing/Undressing Lower body dressing   What is the patient wearing?: Non-skid slipper socks, Underwear, Pants Underwear - Performed by patient: Thread/unthread right underwear leg Underwear - Performed by helper: Thread/unthread left underwear leg, Pull underwear up/down   Pants- Performed by helper: Thread/unthread right pants leg, Thread/unthread left pants leg, Pull pants up/down   Non-skid slipper socks- Performed by helper: Don/doff right sock, Don/doff left sock                  Lower body assist Assist for lower body dressing: Touching or steadying assistance (Pt > 75%)      Toileting Toileting     Toileting steps completed by helper: Adjust clothing prior to toileting, Performs perineal hygiene, Adjust clothing after toileting    Toileting assist Assist level: Two helpers   Transfers Chair/bed transfer     Chair/bed transfer assist level: Moderate assist (Pt 50 - 74%/lift or lower) Chair/bed transfer assistive device: Armrests, Walker     Locomotion Ambulation     Max distance: 15' Assist level: Moderate assist (Pt 50 - 74%)   Wheelchair   Type: Manual Max wheelchair distance: 71' Assist Level: Touching or steadying assistance (Pt > 75%)  Cognition Comprehension Comprehension assist level: Follows complex conversation/direction with no assist  Expression Expression assist level: Expresses complex ideas: With no assist  Social Interaction Social Interaction assist level: Interacts appropriately with others - No medications needed.  Problem Solving Problem solving assist level: Solves basic 90% of the time/requires cueing < 10% of the time  Memory Memory assist level: Recognizes or  recalls 90% of the time/requires cueing < 10% of the time   Medical Problem List and Plan: 1. Functional deficits and right hemiparesissecondary to metastatic lung cancer to the brain. -continue PT, OT. Has made nice gains 2. DVT left gastroc: Mechanical: Sequential compression devices, below knee Bilateral lower extremities, IVC filter in place. No peripheral symptoms at present 3. Headaches/Pain Management: Headaches persistent and being managed with prn hydocodone.  4. Mood: LCSW to follow for evaluation and support.  5. Neuropsych: This patient iscapable of making decisions on herown behalf. 6. Skin/Wound Care: routine pressure relief measures. Cont diet- GI issues may be due to steroids and poor intake of liquid diet.  7. Fluids/Electrolytes/Nutrition: Monitor I/O. BUN 32  -continue to push fluids  -re-check labs later in week.    -D2 thins 8. Nausea: Likely central. Continue full liquid diet. Zofran not effective--continue promethizine prn.  9. H/o A Fib: Managed with diltiazem. Monitor HR bid.  10. Emphysema/Bronchiectasis: continue nebulizers.  chest PT bid as at home. Continue oxygen at nights and prn during the day.  11. Leucocytosis: no signs of infection.   -likely steroid related 12. Constipation: advance bowel regimen  -encourage PO intake   LOS (Days) 2 A FACE TO FACE EVALUATION WAS PERFORMED  Meredith Staggers, MD 01/12/2017 9:32 AM

## 2017-01-13 ENCOUNTER — Inpatient Hospital Stay (HOSPITAL_COMMUNITY): Payer: Medicare HMO | Admitting: Occupational Therapy

## 2017-01-13 ENCOUNTER — Inpatient Hospital Stay (HOSPITAL_COMMUNITY): Payer: Medicare HMO | Admitting: Speech Pathology

## 2017-01-13 ENCOUNTER — Inpatient Hospital Stay (HOSPITAL_COMMUNITY): Payer: Medicare HMO | Admitting: Physical Therapy

## 2017-01-13 ENCOUNTER — Inpatient Hospital Stay (HOSPITAL_COMMUNITY): Payer: Medicare HMO

## 2017-01-13 DIAGNOSIS — Z85118 Personal history of other malignant neoplasm of bronchus and lung: Secondary | ICD-10-CM

## 2017-01-13 DIAGNOSIS — N182 Chronic kidney disease, stage 2 (mild): Secondary | ICD-10-CM

## 2017-01-13 MED ORDER — IPRATROPIUM-ALBUTEROL 0.5-2.5 (3) MG/3ML IN SOLN
3.0000 mL | RESPIRATORY_TRACT | Status: DC | PRN
  Administered 2017-01-21: 3 mL via RESPIRATORY_TRACT
  Filled 2017-01-13: qty 3

## 2017-01-13 NOTE — Progress Notes (Signed)
Occupational Therapy Session Note  Patient Details  Name: Tracy Bridges MRN: [id number] Date of Birth: November 10, 1940  Today's Date: 01/13/2017 OT Individual Time: 1400-1445 OT Individual Time Calculation (min): 45 min    Short Term Goals: Week 1:  OT Short Term Goal 1 (Week 1): Pt will complete bathing at sit<stand level with Mod A OT Short Term Goal 2 (Week 1): Pt will complete toileting tasks with 1 helper OT Short Term Goal 3 (Week 1): Pt will complete toilet transfer with mod A and LRAD  Skilled Therapeutic Interventions/Progress Updates:    Upon entering the room, pt seated in wheelchair with husband present in room. Pt with no c/o pain this session and agreeable to OT intervention. Pt requesting to use bathroom. Pt transferred from wheelchair >elevated toilet with mod lifting assistance and use of grab bar. Pt able to pushing clothing down with steady assistance for balance. Pt able to void urine but unable to have BM this session. Pt returning to wheelchair in same manner and washing hands at sink with supervision. Pt engaged in R UE finger isolation exercises with increased difficulty in 4th and 5th digit and requiring assistance. Pt engaged in palmar translation tasks with coins for stacking. Pt able to complete without drops with use of 4 quarters. Pt utilized pennies and dimes and dropped 2/5 from palm. Pt left coins to continue to practice on her own. Call bell and all needed items within reach upon exiting. Husband present in room.   Therapy Documentation Precautions:  Restrictions Weight Bearing Restrictions: No General:   Vital Signs: Therapy Vitals Temp: 98.6 F (37 C) Temp Source: Oral Pulse Rate: (!) 101 Resp: 18 BP: 136/73 Patient Position (if appropriate): Sitting Oxygen Therapy SpO2: 98 % O2 Device: Not Delivered  See Function Navigator for Current Functional Status.   Therapy/Group: Individual Therapy  Gypsy Decant 01/13/2017, 4:09 PM

## 2017-01-13 NOTE — Progress Notes (Signed)
Physical Therapy Note  Patient Details  Name: Tracy Bridges MRN: [id number] Date of Birth: 1940-12-24 Today's Date: 01/13/2017  1045-1205, 80 min individual tx Pain: none  Pt stated she is allergic to antimicrobial soap and hand cleaner.  PT consulted Neoma Laming, RN about posting a notice. PT donned TEDS.   Pt reported she needed to use toilet.  Stand pivot using wall bar for continent voiding.  W/c propulsion using bil UEs with min assist for steering as pt veers L.  Neuromuscular re-education via forced use, VCs for alternating reciprocal movement bil UE and LE on NuStep at level 4 x 2 minutes, x 2.  Pt self regulates activity due to uncomfortable feeling of A. Fib.   Therapeutic activities in sitting for scooting forward/backward, head-hips relationship to improve transfers, trunk shortening/lengthening to facilitate trunk righting reactions for balance.  Pt limited in cervical/eye movements when reaching laterally due to "swimmy headed"  feeling. No C/o vertigo. Use of slide board for level w/c>< firm mat with cues for head and foot position, lifting bottom rather than sliding across it. Pt left resting in w/c with all needs within reach.  See function navigator for current status.  Jaydee Conran 01/13/2017, 7:52 AM

## 2017-01-13 NOTE — Progress Notes (Signed)
[place] PHYSICAL MEDICINE & REHABILITATION     PROGRESS NOTE    Subjective/Complaints: Had a rought night. ---senokot-s made throat swell up. Feels better this morning. Had 2 bm's yesterday  ROS: pt denies nausea, vomiting, diarrhea, cough, shortness of breath or chest pain   Objective: Vital Signs: Blood pressure (!) 114/47, pulse 73, temperature 98.1 F (36.7 C), temperature source Oral, resp. rate 16, height 5\' 5"  (1.651 m), weight 77.1 kg (170 lb), SpO2 98 %. No results found.  Recent Labs  01/11/17 0545  WBC 14.7*  HGB 11.3*  HCT 35.5*  PLT 99*    Recent Labs  01/11/17 0545  NA 139  K 3.7  CL 104  GLUCOSE 74  BUN 32*  CREATININE 0.97  CALCIUM 8.8*   CBG (last 3)  No results for input(s): GLUCAP in the last 72 hours.  Wt Readings from Last 3 Encounters:  01/13/17 77.1 kg (170 lb)  01/10/17 81.8 kg (180 lb 6.4 oz)  01/09/17 77.7 kg (171 lb 3.2 oz)    Physical Exam:  Constitutional: She is oriented to person, place, and time. She appears well-developedand well-nourished. No distress.  HENT:  Head: Normocephalicand atraumatic.  Eyes: EOMI. No discharge.  Neck: Normal range of motion. Neck supple.  Cardiovascular: RRR without murmur. No JVD  . Respiratory: CTA Bilaterally without wheezes or rales. Normal effort   GI: bowels sounds hypoactive, NT/ND  Musculoskeletal: She exhibits tr LE edema. She exhibits no tenderness.  Neurological: She is alertand oriented to person, place, and time.  Speech clear.  Normal expression. Intact cognition/awareness. Sensory exam normal.   RUE 4 to 4+/5 deltoid, biceps, triceps, wrist, HI. ---stable RLE: 3+/5 HF, KE, 2 to 2+/5 ADF/PF.  Skin: Skin is warmand dry. She is not diaphoretic.  Psychiatric: She has a normal mood and affect. Her behavior is normal. Thought contentnormal.   Assessment/Plan: 1. 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 Physiatrist is providing close team supervision and 24 hour management of active medical problems listed below. Physiatrist and rehab team continue to assess barriers to discharge/monitor patient progress toward functional and medical goals.  Function:  Bathing Bathing position   Position: Shower  Bathing parts Body parts bathed by patient: Right arm, Left arm, Chest, Abdomen, Front perineal area, Buttocks, Right upper leg, Left upper leg Body parts bathed by helper: Right lower leg, Left lower leg, Back  Bathing assist Assist Level: Touching or steadying assistance(Pt > 75%)      Upper Body Dressing/Undressing Upper body dressing   What is the patient wearing?: Pull over shirt/dress     Pull over shirt/dress - Perfomed by patient: Thread/unthread right sleeve, Thread/unthread left sleeve, Put head through opening Pull over shirt/dress - Perfomed by helper: Pull shirt over trunk        Upper body assist Assist Level: Supervision or verbal cues, Set up   Set up : To obtain clothing/put away  Lower Body Dressing/Undressing Lower body dressing   What is the patient wearing?: Non-skid slipper socks, Underwear, Pants Underwear - Performed by patient: Thread/unthread right underwear leg Underwear - Performed by helper: Thread/unthread left underwear leg, Pull underwear up/down   Pants- Performed by helper: Thread/unthread right pants leg, Thread/unthread left pants leg, Pull pants up/down   Non-skid slipper socks- Performed by helper: Don/doff right sock, Don/doff left sock  Lower body assist Assist for lower body dressing: Touching or steadying assistance (Pt > 75%)      Toileting Toileting     Toileting steps completed by helper: Adjust clothing prior to toileting, Performs perineal hygiene, Adjust clothing after toileting Toileting Assistive Devices: Grab bar or rail   Toileting assist Assist level: Touching or steadying assistance (Pt.75%)   Transfers Chair/bed transfer   Chair/bed transfer method: Stand pivot Chair/bed transfer assist level: Moderate assist (Pt 50 - 74%/lift or lower) Chair/bed transfer assistive device: Armrests, Medical sales representative     Max distance: 3' Assist level: Moderate assist (Pt 50 - 74%)   Wheelchair   Type: Manual Max wheelchair distance: 98' Assist Level: Supervision or verbal cues  Cognition Comprehension Comprehension assist level: Follows complex conversation/direction with extra time/assistive device  Expression Expression assist level: Expresses basic 90% of the time/requires cueing < 10% of the time.  Social Interaction Social Interaction assist level: Interacts appropriately with others - No medications needed.  Problem Solving Problem solving assist level: Solves basic 90% of the time/requires cueing < 10% of the time  Memory Memory assist level: Recognizes or recalls 90% of the time/requires cueing < 10% of the time   Medical Problem List and Plan: 1. Functional deficits and right hemiparesissecondary to metastatic lung cancer to the brain. -continue PT, OT. Continues to progress 2. DVT left gastroc: Mechanical: Sequential compression devices, below knee Bilateral lower extremities, IVC filter in place. No peripheral symptoms at present 3. Headaches/Pain Management: Headaches persistent and being managed with prn hydocodone.  4. Mood: LCSW to follow for evaluation and support.  5. Neuropsych: This patient iscapable of making decisions on herown behalf. 6. Skin/Wound Care: routine pressure relief measures. Cont diet- GI issues may be due to steroids and poor intake of liquid diet.  7. Fluids/Electrolytes/Nutrition: Monitor I/O. BUN 32  -continue to push fluids.    -D2 thins 8. Nausea: Likely central. Continue full liquid diet. Zofran not effective--continue promethizine  prn.  9. H/o A Fib: Managed with diltiazem. Monitor HR bid.  10. Emphysema/Bronchiectasis: continue nebulizers.  chest PT bid as at home. Continue oxygen at nights and prn during the day.  11. Leucocytosis: no signs of infection.   -likely steroid related 12. Constipation: apparent reacation to senokot-s.  Will use prn miralax  -encourage fruits/fiber  -encourage PO intake   LOS (Days) 3 A FACE TO FACE EVALUATION WAS PERFORMED  Alger Simons T, MD 01/13/2017 11:50 AM

## 2017-01-13 NOTE — IPOC Note (Addendum)
Overall Plan of Care [facility]) Patient Details Name: Tracy Bridges MRN: [id number] DOB: 1940-09-08  Admitting Diagnosis: Metastatic cancer to brain [facility])  Hospital Problems: Principal Problem:   Metastatic cancer to brain [facility]) Active Problems:   Atrial fibrillation ([place])   Emphysema/COPD ([place])   CKD (chronic kidney disease), stage II   Right sided weakness   History of lung cancer   Vascular headache   Leukocytosis   Prerenal azotemia     Functional Problem List: Nursing Behavior, Bladder, Bowel, Edema, Endurance, Medication Management, Pain, Perception, Safety, Skin Integrity  PT Balance, Endurance, Motor, Pain, Safety, Sensory  OT Balance, Safety, Sensory, Cognition, Endurance, Motor, Pain, Skin Integrity  SLP    TR Endurance, Motor, Pain, Safety       Basic ADL's: OT Grooming, Bathing, Dressing, Toileting     Advanced  ADL's: OT       Transfers: PT Bed Mobility, Bed to Chair, Car, [employer], Futures trader, Metallurgist: PT Ambulation, Emergency planning/management officer, Stairs     Additional Impairments: OT Fuctional Use of Upper Extremity  SLP        TR      Anticipated Outcomes Item Anticipated Outcome  Self Feeding supervision   Swallowing  Mod I   Basic self-care  supervision   Toileting  supervision    Bathroom Transfers supervision   Bowel/Bladder  min assist  Transfers  supervision  Locomotion  supervision  Communication     Cognition  Supervision  Pain  <3 on a 0-10 pain scale  Safety/Judgment  min assist   Therapy Plan: PT Intensity: Minimum of 1-2 x/day ,45 to 90 minutes PT Frequency: 5 out of 7 days PT Duration Estimated Length of Stay: 12-14 days OT Intensity: Minimum of 1-2 x/day, 45 to 90 minutes OT Frequency: 5 out of 7 days OT Duration/Estimated Length of Stay: ~14-18 days SLP Intensity: Minumum of 1-2 x/day, 30 to 90 minutes SLP Frequency: 1 to 3 out of 7 days SLP Duration/Estimated Length of Stay: 01/15/2017     Team Interventions: Nursing Interventions Patient/Family Education, Bladder Management, Bowel Management, Disease Management/Prevention, Pain Management, Discharge Planning, Dysphagia/Aspiration Precaution Training, Skin Care/Wound Management, Medication Management  PT interventions Ambulation/gait training, Disease management/prevention, Pain management, Stair training, Visual/perceptual remediation/compensation, Wheelchair propulsion/positioning, Therapeutic Activities, Patient/family education, DME/adaptive equipment instruction, Training and development officer, Cognitive remediation/compensation, Functional electrical stimulation, Psychosocial support, Therapeutic Exercise, UE/LE Strength taining/ROM, Skin care/wound management, Functional mobility training, Community reintegration, Discharge planning, Neuromuscular re-education, Splinting/orthotics, UE/LE Coordination activities  OT Interventions Balance/vestibular training, Discharge planning, Pain management, Self Care/advanced ADL retraining, Therapeutic Activities, Cognitive remediation/compensation, Disease mangement/prevention, Functional mobility training, Patient/family education, Skin care/wound managment, Therapeutic Exercise, UE/LE Strength taining/ROM, Psychosocial support, Neuromuscular re-education, DME/adaptive equipment instruction, Community reintegration  SLP Interventions Cognitive remediation/compensation, Dysphagia/aspiration precaution training, Medication managment, Functional tasks, Cueing hierarchy  TR Interventions Adaptive equipment instruction, 1:1 session, Training and development officer, Functional mobility training, Community reintegration, Group participation (Comment), Patient/family education, Therapeutic activities, Recreation/leisure participation, Therapeutic exercise, UE/LE Coordination activities, Wheelchair propulsion/positioning  SW/CM Interventions Discharge Planning, Psychosocial Support, Patient/Family Education    Barriers to Discharge MD  Medical stability  Nursing Medical stability, Medication compliance    PT Inaccessible home environment, Home environment access/layout Pt reports husband able to provide Min A physically at home, has been providing that level of assistance for a few months. Son and daughter live locally.   OT      SLP Medical stability    SW       Team  Discharge Planning: Destination: PT-Home ,OT- Home , SLP-Home Projected Follow-up: PT-Outpatient PT, OT-  Home health OT, Outpatient OT, SLP-None Projected Equipment Needs: PT-To be determined, OT- Tub/shower seat, To be determined, SLP-None recommended by SLP Equipment Details: PT-already has rollator, OT-  Patient/family involved in discharge planning: PT- Patient,  OT-Patient, Family member/caregiver, SLP-Patient  MD ELOS: [phone] Medical Rehab Prognosis:  Excellent Assessment: The patient has been admitted for CIR therapies with the diagnosis of 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 The team will be addressing functional mobility, strength, stamina, balance, safety, adaptive techniques and equipment, self-care, bowel and bladder mgt, patient and caregiver education, NMR, pain control, nutrition, family ed, ego support, cognition. Goals have been set at supervision for basic self-care, mobility, and cognition. Meredith Staggers, MD, FAAPMR      See Team Conference Notes for weekly updates to the plan of care

## 2017-01-13 NOTE — Progress Notes (Signed)
Physical Therapy Session Note  Patient Details  Name: Tracy Bridges MRN: [id number] Date of Birth: [birth year]  Today's Date: 01/13/2017 PT Individual Time: 1640-1710 PT Individual Time Calculation (min): 30 min   Short Term Goals: Week 1:  PT Short Term Goal 1 (Week 1): (P) Pt will perform sit<>stand with min assist consistently PT Short Term Goal 2 (Week 1): (P) Pt will ambulate 50 ft with RW and min assist  Skilled Therapeutic Interventions/Progress Updates:   Pt in /c upon arrival and agreeable to therapy, no c/o pain. Worked on functional mobility this session. Pt ambulated 20' x2 w/ Min A for balance and using RW. W/c follow for safety. Verbal and tactile cues for gait pattern, pt ambulates very slow. Pt requesting to take seated rest due to reports of "feeling hot" and moderate increase in work of breathing. Coached pt for breathing during rest breaks. Feelings of heat and increased respiratory rate resolved w/ few minutes of rest. Pt self-propelled w/c back towards room, 25', w/ Min A and verbal cues for breathing during task. Ended session in w/c, call bell within reach and all needs met.   Therapy Documentation Precautions:  Restrictions Weight Bearing Restrictions: No  See Function Navigator for Current Functional Status.   Therapy/Group: Individual Therapy  [name] 01/13/2017, 6:34 PM

## 2017-01-13 NOTE — Progress Notes (Signed)
Speech Language Pathology Daily Session Note  Patient Details  Name: Tracy Bridges MRN: [id number] Date of Birth: [birth year]  Today's Date: 01/13/2017 SLP Individual Time: 0815-0900 SLP Individual Time Calculation (min): 45 min  Short Term Goals: Week 1: SLP Short Term Goal 1 (Week 1): Pt will tolerate trials of Dys 3 without overt s/s aspiration and supervision A verbal cues to utilize swallow compensatory strategies in order to assess diet advancement. SLP Short Term Goal 2 (Week 1): Pt will demonstrate aniticpatory awareness and identify 3 cognitive tasks she can participate in safely with Min A question cues.  SLP Short Term Goal 3 (Week 1): Pt demonstrate functional problem solving for semi-complex taks with Min A verbal cues.   Skilled Therapeutic Interventions: Skilled treatment session focused on cognition goals. SLP facilitated session by administering the [facility] Blind d/t pt not wanting to wear eyeglasses d/t staples and decreased desire to grip pencil. Pt obtained score of 17 out 22 (n=>18). Pt with specific deficits in mental math but has never been able to perform mental math at baseline (husband endorses as well). With paper and pencil pt able to complete math problems independently. 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   Of note, pt is very happy with dysphagia 2 diet ("it is delicious"). Pt doesn't want to attempt larger textures d/t significant history of esophageal issues. Pt and husband state they are able to make texture accomdations at home for esophageal deficits. Will discharge dysphagia goals.       Function:  Eating Eating   Modified Consistency Diet: Yes Eating Assist Level: More than reasonable amount of time           Cognition Comprehension Comprehension assist level: Follows complex conversation/direction with extra time/assistive device  Expression   Expression assist level: Expresses complex  ideas: With extra time/assistive device;Expresses complex 90% of the time/cues < 10% of the time  Social Interaction Social Interaction assist level: Interacts appropriately with others - No medications needed.  Problem Solving Problem solving assist level: Solves complex 90% of the time/cues < 10% of the time;Solves complex problems: With extra time  Memory Memory assist level: More than reasonable amount of time    Pain    Therapy/Group: Individual Therapy  Chamari Cutbirth 01/13/2017, 1:54 PM

## 2017-01-14 ENCOUNTER — Inpatient Hospital Stay (HOSPITAL_COMMUNITY): Payer: Medicare HMO

## 2017-01-14 ENCOUNTER — Inpatient Hospital Stay (HOSPITAL_COMMUNITY): Payer: Medicare HMO | Admitting: Speech Pathology

## 2017-01-14 ENCOUNTER — Inpatient Hospital Stay (HOSPITAL_COMMUNITY): Payer: Medicare HMO | Admitting: Occupational Therapy

## 2017-01-14 MED ORDER — PANTOPRAZOLE SODIUM 20 MG PO TBEC
20.0000 mg | DELAYED_RELEASE_TABLET | Freq: Two times a day (BID) | ORAL | Status: DC
  Administered 2017-01-14 – 2017-01-16 (×5): 20 mg via ORAL
  Filled 2017-01-14 (×5): qty 1

## 2017-01-14 MED ORDER — POLYETHYLENE GLYCOL 3350 17 G PO PACK
17.0000 g | PACK | Freq: Every day | ORAL | Status: DC
  Administered 2017-01-14: 17 g via ORAL
  Filled 2017-01-14: qty 1

## 2017-01-14 MED ORDER — PANTOPRAZOLE SODIUM 40 MG PO PACK
20.0000 mg | PACK | Freq: Two times a day (BID) | ORAL | Status: DC
Start: 2017-01-14 — End: 2017-01-14

## 2017-01-14 NOTE — Progress Notes (Signed)
Physical Therapy Session Note  Patient Details  Name: Tracy Bridges MRN: [id number] Date of Birth: 1940/11/10  Today's Date: 01/14/2017 PT Individual Time: 0830-0930 PT Individual Time Calculation (min): 60 min   Short Term Goals: Week 1:  PT Short Term Goal 1 (Week 1): (P) Pt will perform sit<>stand with min assist consistently PT Short Term Goal 2 (Week 1): (P) Pt will ambulate 50 ft with RW and min assist  Skilled Therapeutic Interventions/Progress Updates:   w/c mobility training for BUE/BLE coordination and functional strengthening x 50' with supervision and cues for propulsion technique. Sit <> stand with max assist and multiple attempts - pt limited with standing due to c/o feeling hot and returned to seated position. Pt request to warm up and bike - utlizted Nustep for forced use on RLE and reciprocal movement pattern retraining on level 2 x 10 min with BUE/BLE. Max assist for squat pivot from w/c -> Nustep and cues for anterior weightshift and hand placement. Mod assist for sit <> stand and min for stand pivot to w/c with RW. Pt able to perform sit -> stand better when pushing up with BUE from armrests. Min assist gait x 10' with RW with facilitation for weightshift to clear RLE for advancement. Pt reporting RLE feeling more stiff this morning.   Therapy Documentation Precautions:  Restrictions Weight Bearing Restrictions: No  Pain: Pain Assessment Pain Assessment: No/denies pain   See Function Navigator for Current Functional Status.   Therapy/Group: Individual Therapy  [name], PT, DPT  01/14/2017, 9:33 AM

## 2017-01-14 NOTE — Progress Notes (Signed)
Social Work Patient ID: Tracy Bridges, female   DOB: 04-11-1940, 76 y.o.   MRN: [id number]    Met with pt and her husband following team conference.  Both aware and agreeable with targeted d/c date of 10/23 and supervision goals.   Pt very pleased with progress to date and states she "might even be ready earlier."  Surina Storts, LCSW

## 2017-01-14 NOTE — Care Management Note (Signed)
[place] Individual Statement of Services  Patient Name:  Tracy Bridges  Date:  01/14/2017  Welcome to the [place].  Our goal is to provide you with an individualized program based on your diagnosis and situation, designed to meet your specific needs.  With this comprehensive rehabilitation program, you will be expected to participate in at least 3 hours of rehabilitation therapies Monday-Friday, with modified therapy programming on the weekends.  Your rehabilitation program will include the following services:  Physical Therapy (PT), Occupational Therapy (OT), Speech Therapy (ST), 24 hour per day rehabilitation nursing, Therapeutic Recreaction (TR), Neuropsychology, Case Management (Social Worker), Rehabilitation Medicine, Nutrition Services and Pharmacy Services  Weekly team conferences will be held on Tuesdays to discuss your progress.  Your Social Worker will talk with you frequently to get your input and to update you on team discussions.  Team conferences with you and your family in attendance may also be held.  Expected length of stay: 14-18 days  Overall anticipated outcome: supervision  Depending on your progress and recovery, your program may change. Your Social Worker will coordinate services and will keep you informed of any changes. Your Social Worker's name and contact numbers are listed  below.  The following services may also be recommended but are not provided by the [facility] will be made to provide these services after discharge if needed.  Arrangements include referral to agencies that provide these services.  Your insurance has been verified to be:  [employer] Your primary doctor is:  Sherrilee Gilles  Pertinent information will be shared with your doctor and  your insurance company.  Social Worker:  [place], [place] or ([phone]   Information discussed with and copy given to patient by: Lennart Pall, 01/14/2017, 2:33 PM

## 2017-01-14 NOTE — Progress Notes (Signed)
Speech Language Pathology Discharge Summary  Patient Details  Name: Tracy Bridges MRN: [id number] Date of Birth: 1941-01-10  Today's Date: 01/14/2017 SLP Individual Time: 0730-0800 SLP Individual Time Calculation (min): 30 min   Skilled Therapeutic Interventions:  Skilled treatment session focused on cognition goals. SLP facilitated session by providing Mod I to solve complex problems within room. Pt able to direct self-care with transfers to bathroom with no cues. Pt demonstrates good insight into deficits and able to complete semi-complex math problems with paper and pencil. Recommend pt discharge from [facility] at this time. No further services are indicated. Pt and husband are agreeable.     Patient has met 3 of 3 long term goals.  Patient to discharge at overall Modified Independent;Independent level.  Reasons goals not met:     Clinical Impression/Discharge Summary:    Care Partner:  Caregiver Able to Provide Assistance: Yes     Recommendation:  None      Equipment:     Reasons for discharge: Treatment goals met   Patient/Family Agrees with Progress Made and Goals Achieved: Yes   Function:  Eating Eating                 Cognition Comprehension Comprehension assist level: Follows complex conversation/direction with extra time/assistive device  Expression   Expression assist level: Expresses complex ideas: With extra time/assistive device  Social Interaction Social Interaction assist level: Interacts appropriately with others - No medications needed.  Problem Solving Problem solving assist level: Solves complex 90% of the time/cues < 10% of the time;Solves complex problems: With extra time  Memory Memory assist level: More than reasonable amount of time   [name] 01/14/2017, 8:41 AM

## 2017-01-14 NOTE — Progress Notes (Signed)
[place] PHYSICAL MEDICINE & REHABILITATION     PROGRESS NOTE    Subjective/Complaints: Feeling well this morning. Had a busy and productive day with therapies yesterday. She was pleased with the results.   ROS: pt denies nausea, vomiting, diarrhea, cough, shortness of breath or chest pain   Objective: Vital Signs: Blood pressure (!) 113/39, pulse 72, temperature 98.7 F (37.1 C), temperature source Oral, resp. rate 18, height 5\' 5"  (1.651 m), weight 77.2 kg (170 lb 4 oz), SpO2 97 %. No results found. No results for input(s): WBC, HGB, HCT, PLT in the last 72 hours. No results for input(s): NA, K, CL, GLUCOSE, BUN, CREATININE, CALCIUM in the last 72 hours.  Invalid input(s): CO CBG (last 3)  No results for input(s): GLUCAP in the last 72 hours.  Wt Readings from Last 3 Encounters:  01/14/17 77.2 kg (170 lb 4 oz)  01/10/17 81.8 kg (180 lb 6.4 oz)  01/09/17 77.7 kg (171 lb 3.2 oz)    Physical Exam:  Constitutional: She is oriented to person, place, and time. She appears well-developedand well-nourished. No distress.  HENT:  Head: Normocephalicand atraumatic.  Eyes: EOMI. No discharge.  Neck: Normal range of motion. Neck supple.  Cardiovascular:RRR without murmur. No JVD   . Respiratory: CTA Bilaterally without wheezes or rales. Normal effort  GI: bowels sounds hypoactive, NT/ND  Musculoskeletal: She exhibits tr LE edema. She exhibits no tenderness.  Neurological: She is alertand oriented to person, place, and time.  Speech clear.  Normal expression. Intact cognition/awareness. Sensory exam normal.   RUE 4 to 4+/5 deltoid, biceps, triceps, wrist, HI. Stable to improved RLE: 3+/5 HF, KE,  2+/5 ADF/PF.  Skin: Skin is warmand dry. She is not diaphoretic.  Psychiatric: She has a normal mood and affect. Her behavior is normal. Thought contentnormal.   Assessment/Plan: 1. 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 Physiatrist is providing close team supervision and 24 hour management of active medical problems listed below. Physiatrist and rehab team continue to assess barriers to discharge/monitor patient progress toward functional and medical goals.  Function:  Bathing Bathing position   Position: Shower  Bathing parts Body parts bathed by patient: Right arm, Left arm, Chest, Abdomen, Front perineal area, Buttocks, Right upper leg, Left upper leg Body parts bathed by helper: Right lower leg, Left lower leg, Back  Bathing assist Assist Level: Touching or steadying assistance(Pt > 75%)      Upper Body Dressing/Undressing Upper body dressing   What is the patient wearing?: Pull over shirt/dress     Pull over shirt/dress - Perfomed by patient: Thread/unthread right sleeve, Thread/unthread left sleeve, Put head through opening Pull over shirt/dress - Perfomed by helper: Pull shirt over trunk        Upper body assist Assist Level: Supervision or verbal cues, Set up   Set up : To obtain clothing/put away  Lower Body Dressing/Undressing Lower body dressing   What is the patient wearing?: Non-skid slipper socks, Underwear, Pants Underwear - Performed by patient: Thread/unthread right underwear leg Underwear - Performed by helper: Thread/unthread left underwear leg, Pull underwear up/down   Pants- Performed by helper: Thread/unthread right pants leg, Thread/unthread left pants leg, Pull pants up/down   Non-skid slipper socks- Performed by helper: Don/doff right sock, Don/doff left sock  Lower body assist Assist for lower body dressing: Touching or steadying assistance (Pt > 75%)      Toileting Toileting   Toileting steps completed by patient: Performs perineal hygiene, Adjust clothing prior to toileting Toileting steps completed by helper: Adjust clothing after  toileting Toileting Assistive Devices: Grab bar or rail  Toileting assist Assist level:  (mod A)   Transfers Chair/bed transfer   Chair/bed transfer method: Lateral scoot Chair/bed transfer assist level: Moderate assist (Pt 50 - 74%/lift or lower) Chair/bed transfer assistive device: Sliding board     Locomotion Ambulation     Max distance: 10' Assist level: Touching or steadying assistance (Pt > 75%)   Wheelchair   Type: Manual Max wheelchair distance: 35' Assist Level: Supervision or verbal cues  Cognition Comprehension Comprehension assist level: Follows complex conversation/direction with extra time/assistive device  Expression Expression assist level: Expresses complex ideas: With extra time/assistive device  Social Interaction Social Interaction assist level: Interacts appropriately with others - No medications needed.  Problem Solving Problem solving assist level: Solves complex 90% of the time/cues < 10% of the time, Solves complex problems: With extra time  Memory Memory assist level: More than reasonable amount of time   Medical Problem List and Plan: 1. Functional deficits and right hemiparesissecondary to metastatic lung cancer to the brain. -continue PT, OT. Continues to progress with mobility and cognition 2. DVT left gastroc: Mechanical: Sequential compression devices, below knee Bilateral lower extremities, IVC filter in place. No peripheral symptoms at present 3. Headaches/Pain Management: Headaches persistent and being managed with prn hydocodone.  4. Mood: LCSW to follow for evaluation and support.  5. Neuropsych: This patient iscapable of making decisions on herown behalf. 6. Skin/Wound Care: routine pressure relief measures. Cont diet- GI issues may be due to steroids and poor intake of liquid diet.  7. Fluids/Electrolytes/Nutrition: Monitor I/O. BUN 32 on 10/9---recheck monday  -continue to push fluids. Intake 50-75%    -D2 thins 8.  Nausea: Likely central. Continue full liquid diet. Zofran not effective--continue promethizine prn.  9. H/o A Fib: Managed with diltiazem. Monitor HR bid.  10. Emphysema/Bronchiectasis: continue nebulizers.  chest PT bid as at home. Continue oxygen at nights and prn during the day.  11. Leucocytosis: no signs of infection.   -likely steroid related  -follow up on monday 12. Constipation: apparent reacation to senokot-s.    -use prn miralax  -encourage fruits/fiber  -encourage PO fluids   LOS (Days) 4 A FACE TO FACE EVALUATION WAS PERFORMED  Alger Simons T, MD 01/14/2017 10:08 AM

## 2017-01-14 NOTE — Progress Notes (Addendum)
Occupational Therapy Session Note  Patient Details  Name: Tracy Bridges MRN: [id number] Date of Birth: 1940/12/21  Today's Date: 01/14/2017 OT Individual Time: 1001-1102 OT Individual Time Calculation (min): 61 min    Short Term Goals: Week 1:  OT Short Term Goal 1 (Week 1): Pt will complete bathing at sit<stand level with Mod A OT Short Term Goal 2 (Week 1): Pt will complete toileting tasks with 1 helper OT Short Term Goal 3 (Week 1): Pt will complete toilet transfer with mod A and LRAD  Skilled Therapeutic Interventions/Progress Updates:    Tx focus on activity tolerance and Rt NMR during self care tasks.   Pt greeted in w/c, reported not feeling well secondary to constipation. Already tried sitting on toilet this AM to facilitate BM. Agreeable to complete a light wash up at the sink with setup. Mod A for doffing overhead shirt due to ROM deficits. Pt proceeded to wash UB and lower legs. Pt utilizing figure 4 position with extra time and bilateral UE lift assist. Pt requiring UE support to maintain figure 4 while she washed her feet. Pt using R UE to squeeze lotion from bottle and manage cap. Oral care/grooming tasks completed with R UE at nondominant level. Min vcs for scanning to Lt side of sink during session. Pt requiring multiple rest breaks throughout, purse lipped breathing audible. At end of tx pt reported feeling "much better" than she did at start of tx. [name] and RN present at time of departure.   Therapy Documentation Precautions:  Restrictions Weight Bearing Restrictions: No  Pain: No c/o pain during tx    ADL:     See Function Navigator for Current Functional Status.   Therapy/Group: Individual Therapy  Eleana Tocco A Iona Stay 01/14/2017, 12:46 PM

## 2017-01-14 NOTE — Progress Notes (Addendum)
Occupational Therapy Session Note  Patient Details  Name: Tracy Bridges MRN: [id number] Date of Birth: 08/30/1940  Today's Date: 01/14/2017 OT Individual Time: 1440-1520 OT Individual Time Calculation (min): 40 min   Short Term Goals: Week 1:  OT Short Term Goal 1 (Week 1): Pt will complete bathing at sit<stand level with Mod A OT Short Term Goal 2 (Week 1): Pt will complete toileting tasks with 1 helper OT Short Term Goal 3 (Week 1): Pt will complete toilet transfer with mod A and LRAD  Skilled Therapeutic Interventions/Progress Updates:    Pt greeted seated in wc stating she was feeling nauseous. Pt requested to try to go to the bathroom. Pt completed stand-pivot from wc to raised toilet seat over commode with mod A lifting and heavy use of grab bars. Pt then able to pivot with min A and mod A for controlled descent onto commode. Pt voided bladder but unsuccessful with BM. Worked on toileting strategies with hip hike and trunk rotation to cleanse peri-area. Pt unable to stand with max A to power up from commode so [name] used with max A to lift. Pt washed hands seated in [name] at the sink. Pt reached max fatigue and needed extended rest break to recover. SpO2 98 HR 110.  Pt reported continued nausea and requested to return to bed. Sit<>stand from [place] with mod A. Max A to return to supine 2/2 lifting B LE's. Pt able to scoot self up in bed in trendelenburg with min A to position LE's and UE's. Pt left with needs met and call bell in hand.   See Function Navigator for Current Functional Status.  Therapy/Group: Individual Therapy  Valma Cava 01/14/2017, 2:49 PM

## 2017-01-15 ENCOUNTER — Inpatient Hospital Stay (HOSPITAL_COMMUNITY): Payer: Medicare HMO | Admitting: Occupational Therapy

## 2017-01-15 NOTE — Plan of Care (Signed)
Problem: RH SKIN INTEGRITY Goal: RH STG SKIN FREE OF INFECTION/BREAKDOWN Skin to remain clean, dry, and free of infection or breakdown while on rehab with min assist.  Outcome: Not Progressing MASD to buttocks worsening, Yeast to breast better

## 2017-01-15 NOTE — Progress Notes (Signed)
[facility] PHYSICAL MEDICINE & REHABILITATION     PROGRESS NOTE    Subjective/Complaints: " bad day yesterday"  Sick and nauseated after miralax  ROS: pt denies nausea, vomiting, diarrhea, cough, shortness of breath or chest pain   Objective: Vital Signs: Blood pressure 129/79, pulse 72, temperature 98.5 F (36.9 C), temperature source Oral, resp. rate 16, height 5\' 5"  (1.651 m), weight 78.9 kg (174 lb), SpO2 93 %. No results found. No results for input(s): WBC, HGB, HCT, PLT in the last 72 hours. No results for input(s): NA, K, CL, GLUCOSE, BUN, CREATININE, CALCIUM in the last 72 hours.  Invalid input(s): CO CBG (last 3)  No results for input(s): GLUCAP in the last 72 hours.  Wt Readings from Last 3 Encounters:  01/15/17 78.9 kg (174 lb)  01/10/17 81.8 kg (180 lb 6.4 oz)  01/09/17 77.7 kg (171 lb 3.2 oz)    Physical Exam:  Constitutional: She is oriented to person, place, and time. She appears well-developedand well-nourished. No distress.  HENT:  Head: Normocephalicand atraumatic.  Eyes: EOMI. No discharge.  Neck: Normal range of motion. Neck supple.  Cardiovascular:RRR without murmur. No JVD   . Respiratory: CTA Bilaterally without wheezes or rales. Normal effort  GI: bowels sounds hypoactive, NT/ND  Musculoskeletal: She exhibits tr LE edema. She exhibits no tenderness.  Neurological: She is alertand oriented to person, place, and time.  Speech clear.  Normal expression. Intact cognition/awareness. Sensory exam normal.   RUE 4 to 4+/5 deltoid, biceps, triceps, wrist, HI. Stable to improved RLE: 3+/5 HF, KE,  2+/5 ADF/PF.  Skin: Skin is warmand dry. She is not diaphoretic.  Psychiatric: She has a normal mood and affect. Her behavior is normal. Thought contentnormal.   Assessment/Plan: 1. 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 Physiatrist is providing close team supervision and 24 hour management of active medical problems listed below. Physiatrist and rehab team continue to assess barriers to discharge/monitor patient progress toward functional and medical goals.  Function:  Bathing Bathing position   Position: Wheelchair/chair at sink  Bathing parts Body parts bathed by patient: Right arm, Left arm, Chest, Abdomen, Right lower leg, Left lower leg Body parts bathed by helper: Back  Bathing assist Assist Level: Touching or steadying assistance(Pt > 75%)      Upper Body Dressing/Undressing Upper body dressing   What is the patient wearing?: Pull over shirt/dress     Pull over shirt/dress - Perfomed by patient: Thread/unthread right sleeve, Thread/unthread left sleeve, Put head through opening, Pull shirt over trunk Pull over shirt/dress - Perfomed by helper: Pull shirt over trunk        Upper body assist Assist Level: Supervision or verbal cues   Set up : To obtain clothing/put away  Lower Body Dressing/Undressing Lower body dressing   What is the patient wearing?: Non-skid slipper socks Underwear - Performed by patient: Thread/unthread right underwear leg Underwear - Performed by helper: Thread/unthread left underwear leg, Pull underwear up/down   Pants- Performed by helper: Thread/unthread right pants leg, Thread/unthread left pants leg, Pull pants up/down Non-skid slipper socks- Performed by patient: Don/doff right sock, Don/doff left sock Non-skid slipper socks- Performed by helper: Don/doff right sock, Don/doff left sock                  Lower body assist Assist for lower body dressing: Touching or steadying  assistance (Pt > 75%)      Toileting Toileting   Toileting steps completed by patient: Adjust clothing prior to toileting, Performs perineal hygiene Toileting steps completed by helper: Adjust clothing prior to toileting, Performs perineal hygiene, Adjust  clothing after toileting Toileting Assistive Devices: Grab bar or rail  Toileting assist Assist level: Two helpers   Transfers Chair/bed transfer   Chair/bed transfer method: Lateral scoot Chair/bed transfer assist level: Moderate assist (Pt 50 - 74%/lift or lower) Chair/bed transfer assistive device: Sliding board     Locomotion Ambulation     Max distance: 10' Assist level: Touching or steadying assistance (Pt > 75%)   Wheelchair   Type: Manual Max wheelchair distance: 66' Assist Level: Supervision or verbal cues  Cognition Comprehension Comprehension assist level: Follows complex conversation/direction with extra time/assistive device  Expression Expression assist level: Expresses complex ideas: With extra time/assistive device  Social Interaction Social Interaction assist level: Interacts appropriately with others - No medications needed.  Problem Solving Problem solving assist level: Solves complex 90% of the time/cues < 10% of the time, Solves complex problems: With extra time  Memory Memory assist level: More than reasonable amount of time   Medical Problem List and Plan: 1. Functional deficits and right hemiparesissecondary to metastatic lung cancer to the brain. -continue PT, OT. Continues to progress with mobility and cognition 2. DVT left gastroc: Mechanical: Sequential compression devices, below knee Bilateral lower extremities, IVC filter in place. No peripheral symptoms at present 3. Headaches/Pain Management: Headaches persistent and being managed with prn hydocodone.  4. Mood: LCSW to follow for evaluation and support.  5. Neuropsych: This patient iscapable of making decisions on herown behalf. 6. Skin/Wound Care: routine pressure relief measures. Cont diet- GI issues may be due to steroids and poor intake of liquid diet.  7. Fluids/Electrolytes/Nutrition: Monitor I/O. BUN 32 on 10/9---recheck monday  -continue to push fluids. Intake 50-75%     -D2 thins 8. Nausea: Likely central. Continue full liquid diet. Zofran not effective--continue promethizine prn.  9. H/o A Fib: Managed with diltiazem. Monitor HR bid.  10. Emphysema/Bronchiectasis: continue nebulizers.  chest PT bid as at home. Continue oxygen at nights and prn during the day.  11. Leucocytosis: no signs of infection.   -likely steroid related  -follow up on monday 12. Constipation: apparent reacation to senokot-s.    -use prn miralax  -encourage fruits/fiber  -encourage PO fluids   LOS (Days) 5 A FACE TO FACE EVALUATION WAS PERFORMED  Charlett Blake, MD 01/15/2017 10:36 AM

## 2017-01-15 NOTE — Progress Notes (Signed)
Occupational Therapy Session Note  Patient Details  Name: Tracy Bridges MRN: [id number] Date of Birth: [birth year]  Today's Date: 01/15/2017 OT Individual Time: 1330-1430 OT Individual Time Calculation (min): 60 min    Short Term Goals: Week 1:  OT Short Term Goal 1 (Week 1): Pt will complete bathing at sit<stand level with Mod A OT Short Term Goal 2 (Week 1): Pt will complete toileting tasks with 1 helper OT Short Term Goal 3 (Week 1): Pt will complete toilet transfer with mod A and LRAD  Skilled Therapeutic Interventions/Progress Updates:     Upon entering the room, pt seated in wheelchair with husband present in room. Pt without c/o pain this session and agreeable to OT intervention. Pt requesting to continues to address fine motor coordination deficits in R UE. Pt provided with paper handouts for theraputty exercises. OT demonstrated each exercise with pt returning demonstrations with use of red theraputty. Pt requires min cues for proper technique and taking 2 rest breaks secondary to hand fatigue. Pt place to return putty to container at end of session without issue. Pt remained in wheelchair with call bell and all needed items within reach upon exiting the room.   Therapy Documentation Precautions:  Restrictions Weight Bearing Restrictions: No  See Function Navigator for Current Functional Status.   Therapy/Group: Individual Therapy  Gypsy Decant 01/15/2017, 4:29 PM

## 2017-01-16 ENCOUNTER — Inpatient Hospital Stay (HOSPITAL_COMMUNITY): Payer: Medicare HMO

## 2017-01-16 DIAGNOSIS — K219 Gastro-esophageal reflux disease without esophagitis: Secondary | ICD-10-CM

## 2017-01-16 DIAGNOSIS — I480 Paroxysmal atrial fibrillation: Secondary | ICD-10-CM

## 2017-01-16 MED ORDER — PANTOPRAZOLE SODIUM 40 MG PO TBEC
40.0000 mg | DELAYED_RELEASE_TABLET | Freq: Two times a day (BID) | ORAL | Status: DC
  Administered 2017-01-16 – 2017-01-28 (×24): 40 mg via ORAL
  Filled 2017-01-16 (×24): qty 1

## 2017-01-16 MED ORDER — NYSTATIN 100000 UNIT/GM EX CREA
TOPICAL_CREAM | Freq: Two times a day (BID) | CUTANEOUS | Status: DC
  Administered 2017-01-16 – 2017-01-23 (×10): via TOPICAL
  Filled 2017-01-16: qty 15

## 2017-01-16 NOTE — Progress Notes (Signed)
Occupational Therapy Session Note  Patient Details  Name: Tracy Bridges MRN: [id number] Date of Birth: 07/29/1940  Today's Date: 01/16/2017 OT Individual Time: 1600-1645 OT Individual Time Calculation (min): 45 min     Skilled Therapeutic Interventions/Progress Updates:    1:1. Pt completing bathing and dressing at shower level. Pt transfers using stedy 2/2 bathroom set up to/from TTB in shower with MOD lifting assistance for sit to stand in stedy with Vc for terminal hip extension and R quad activation. Pt bathes seated with A to wash back and buttocks. Pt dresses seated in recliner with set up A to don shirt (pt able to recall hemi techniques).OT dons ted hose. Pt able to bring RLE to seated figure four 1/3 trials without A to thread underwear, pant leg and socks onto feet. Pt sit to stand with MOD A with RW with similar cueing as stated above as OT advances pants past hips. Exited session with pt seated in recliner with call light in reach and all needs met.   Therapy Documentation Precautions:  Restrictions Weight Bearing Restrictions: No General:  See Function Navigator for Current Functional Status.   Therapy/Group: Individual Therapy  Stephanie M Schlosser 01/16/2017, 4:55 PM 

## 2017-01-16 NOTE — Progress Notes (Signed)
[facility] PHYSICAL MEDICINE & REHABILITATION     PROGRESS NOTE    Subjective/Complaints: Sitting at EOB due to reflux takes prilosec at home on BID basis  ROS: pt denies nausea, vomiting, diarrhea, cough, shortness of breath or chest pain   Objective: Vital Signs: Blood pressure (!) 115/41, pulse 76, temperature 98.5 F (36.9 C), temperature source Oral, resp. rate 16, height 5\' 5"  (1.651 m), weight 74.4 kg (164 lb 1.6 oz), SpO2 96 %. No results found. No results for input(s): WBC, HGB, HCT, PLT in the last 72 hours. No results for input(s): NA, K, CL, GLUCOSE, BUN, CREATININE, CALCIUM in the last 72 hours.  Invalid input(s): CO CBG (last 3)  No results for input(s): GLUCAP in the last 72 hours.  Wt Readings from Last 3 Encounters:  01/16/17 74.4 kg (164 lb 1.6 oz)  01/10/17 81.8 kg (180 lb 6.4 oz)  01/09/17 77.7 kg (171 lb 3.2 oz)    Physical Exam:  Constitutional: She is oriented to person, place, and time. She appears well-developedand well-nourished. No distress.  HENT:  Head: Normocephalicand atraumatic.  Eyes: EOMI. No discharge.  Neck: Normal range of motion. Neck supple.  Cardiovascular:RRR without murmur. No JVD   . Respiratory: CTA Bilaterally without wheezes or rales. Normal effort  GI: bowels sounds hypoactive, NT/ND  Musculoskeletal: She exhibits tr LE edema. She exhibits no tenderness.  Neurological: She is alertand oriented to person, place, and time.  Speech clear.  Normal expression. Intact cognition/awareness. Sensory exam normal.   RUE 4 to 4+/5 deltoid, biceps, triceps, wrist, HI. Stable to improved RLE: 3+/5 HF, KE,  3-/5 ADF/PF.  Skin: Skin is warmand dry. She is not diaphoretic.  Psychiatric: She has a normal mood and affect. Her behavior is normal. Thought contentnormal.   Assessment/Plan: 1. 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 Physiatrist is providing close team supervision and 24 hour management of active medical problems listed below. Physiatrist and rehab team continue to assess barriers to discharge/monitor patient progress toward functional and medical goals.  Function:  Bathing Bathing position   Position: Wheelchair/chair at sink  Bathing parts Body parts bathed by patient: Right arm, Left arm, Chest, Abdomen, Right lower leg, Left lower leg Body parts bathed by helper: Back  Bathing assist Assist Level: Touching or steadying assistance(Pt > 75%)      Upper Body Dressing/Undressing Upper body dressing   What is the patient wearing?: Pull over shirt/dress     Pull over shirt/dress - Perfomed by patient: Thread/unthread right sleeve, Thread/unthread left sleeve, Put head through opening, Pull shirt over trunk Pull over shirt/dress - Perfomed by helper: Pull shirt over trunk        Upper body assist Assist Level: Supervision or verbal cues   Set up : To obtain clothing/put away  Lower Body Dressing/Undressing Lower body dressing   What is the patient wearing?: Non-skid slipper socks Underwear - Performed by patient: Thread/unthread right underwear leg Underwear - Performed by helper: Thread/unthread left underwear leg, Pull underwear up/down   Pants- Performed by helper: Thread/unthread right pants leg, Thread/unthread left pants leg, Pull pants up/down Non-skid slipper socks- Performed by patient: Don/doff right sock, Don/doff left sock Non-skid slipper socks- Performed by helper: Don/doff right sock, Don/doff left sock                  Lower body assist  Assist for lower body dressing: Touching or steadying assistance (Pt > 75%)      Toileting Toileting   Toileting steps completed by patient: Adjust clothing prior to toileting Toileting steps completed by helper: Performs perineal hygiene, Adjust clothing after toileting Toileting  Assistive Devices: Grab bar or rail (stedy)  Toileting assist Assist level: Touching or steadying assistance (Pt.75%)   Transfers Chair/bed transfer   Chair/bed transfer method: Lateral scoot Chair/bed transfer assist level: Moderate assist (Pt 50 - 74%/lift or lower) Chair/bed transfer assistive device: Sliding board     Locomotion Ambulation     Max distance: 10' Assist level: Touching or steadying assistance (Pt > 75%)   Wheelchair   Type: Manual Max wheelchair distance: 71' Assist Level: Supervision or verbal cues  Cognition Comprehension Comprehension assist level: Follows complex conversation/direction with no assist  Expression Expression assist level: Expresses complex ideas: With no assist  Social Interaction Social Interaction assist level: Interacts appropriately with others with medication or extra time (anti-anxiety, antidepressant).  Problem Solving Problem solving assist level: Solves complex 90% of the time/cues < 10% of the time  Memory Memory assist level: More than reasonable amount of time   Medical Problem List and Plan: 1. Functional deficits and right hemiparesissecondary to metastatic lung cancer to the brain. -continue PT, OT. Moving RLE better today   2. DVT left gastroc: Mechanical: Sequential compression devices, below knee Bilateral lower extremities, IVC filter in place. No peripheral symptoms at present 3. Headaches/Pain Management: Headaches persistent and being managed with prn hydocodone.  4. Mood: LCSW to follow for evaluation and support.  5. Neuropsych: This patient iscapable of making decisions on herown behalf. 6. Skin/Wound Care: routine pressure relief measures. Cont diet- GI issues may be due to steroids and poor intake of liquid diet.  7. Fluids/Electrolytes/Nutrition: Monitor I/O. BUN 32 on 10/9---recheck monday  -continue to push fluids. Intake 50-75%    -D2 thins 8. Nausea: Likely central. Continue full liquid diet.  Zofran not effective--continue promethizine prn.  9. H/o A Fib: Managed with diltiazem. Monitor HR bid. HR controlled 10/14 Vitals:   01/16/17 0500 01/16/17 0849  BP: (!) 115/41   Pulse: 76   Resp: 16   Temp: 98.5 F (36.9 C)   SpO2: 96% 96%   10. Emphysema/Bronchiectasis: continue nebulizers.  chest PT bid as at home. Continue oxygen at nights and prn during the day.  11. Leucocytosis: no signs of infection.   -likely steroid related  -follow up on monday 12. Constipation: apparent reacation to senokot-s.    -use prn miralax  -encourage fruits/fiber  -encourage PO fluids 13.  GERD-persists despite Protonix 20mg  BID will increase to 40mg  LOS (Days) 6 A FACE TO FACE EVALUATION WAS PERFORMED  Charlett Blake, MD 01/16/2017 10:01 AM

## 2017-01-17 ENCOUNTER — Inpatient Hospital Stay (HOSPITAL_COMMUNITY): Payer: Medicare HMO

## 2017-01-17 ENCOUNTER — Inpatient Hospital Stay (HOSPITAL_COMMUNITY): Payer: Medicare HMO | Admitting: Physical Therapy

## 2017-01-17 ENCOUNTER — Inpatient Hospital Stay (HOSPITAL_COMMUNITY): Payer: Medicare HMO | Admitting: Occupational Therapy

## 2017-01-17 LAB — BASIC METABOLIC PANEL
ANION GAP: 9 (ref 5–15)
BUN: 11 mg/dL (ref 6–20)
CALCIUM: 8.3 mg/dL — AB (ref 8.9–10.3)
CO2: 27 mmol/L (ref 22–32)
Chloride: 104 mmol/L (ref 101–111)
Creatinine, Ser: 0.86 mg/dL (ref 0.44–1.00)
GFR calc Af Amer: >60 mL/min (ref >60)
GFR calc non Af Amer: >60 mL/min (ref >60)
GLUCOSE: 83 mg/dL (ref 65–99)
Potassium: 3.2 mmol/L — ABNORMAL LOW (ref 3.5–5.1)
Sodium: 140 mmol/L (ref 135–145)

## 2017-01-17 LAB — CBC
HCT: 30.9 % — ABNORMAL LOW (ref 36.0–46.0)
HEMOGLOBIN: 10 g/dL — AB (ref 12.0–15.0)
MCH: 30.1 pg (ref 26.0–34.0)
MCHC: 32.4 g/dL (ref 30.0–36.0)
MCV: 93.1 fL (ref 78.0–100.0)
Platelets: 88 10*3/uL — ABNORMAL LOW (ref 150–400)
RBC: 3.32 MIL/uL — ABNORMAL LOW (ref 3.87–5.11)
RDW: 15.7 % — ABNORMAL HIGH (ref 11.5–15.5)
WBC: 6.4 10*3/uL (ref 4.0–10.5)

## 2017-01-17 MED ORDER — POTASSIUM CHLORIDE CRYS ER 20 MEQ PO TBCR
20.0000 meq | EXTENDED_RELEASE_TABLET | Freq: Every day | ORAL | Status: DC
  Administered 2017-01-17 – 2017-01-19 (×3): 20 meq via ORAL
  Filled 2017-01-17 (×3): qty 1

## 2017-01-17 NOTE — Plan of Care (Signed)
Problem: RH Ambulation Goal: LTG Patient will ambulate in community environment (PT) LTG: Patient will ambulate in community environment, # of feet with assistance (PT).  Outcome: Not Applicable Date Met: 56/43/32 Will use w/c for community distances

## 2017-01-17 NOTE — Progress Notes (Signed)
Occupational Therapy Session Note  Patient Details  Name: Tracy Bridges MRN: [id number] Date of Birth: December 16, 1940  Today's Date: 01/17/2017 OT Individual Time: 2395-3202 OT Individual Time Calculation (min): 75 min  Short Term Goals: Week 1:  OT Short Term Goal 1 (Week 1): Pt will complete bathing at sit<stand level with Mod A OT Short Term Goal 2 (Week 1): Pt will complete toileting tasks with 1 helper OT Short Term Goal 3 (Week 1): Pt will complete toilet transfer with mod A and LRAD    Skilled Therapeutic Interventions/Progress Updates:    Tx focus on standing tolerance, ADL retraining, and Rt NMR during self care and IADL tasks.   Pt greeted supine in bed, declining B/D but requesting to use the bathroom. Pt completed sit<stand in [place] with Max A and manual facilitation for proper placement of R LE on platform. Pt successful of voiding B/B. Total A for tasks, including applying barrier cream and powder to buttocks. With bilateral UE support, pt standing for 3 minute windows during hygiene completion. After handwashing w/c level, pt elevating each leg into figure 4 position to apply lotion. This took her extra time, but able to complete herself! Also donned both gripper socks once OT donned Teds. Pt afterwards engaging in bedmaking task. Bilateral UE strengthening as she stripped the bed w/c level, also doffed pillowcases. She required cues to lock w/c prior to bending forward to retrieve items. Bimanual training/Rt NMR with pt donning thin pillowcases on 7 pillows. At end of tx pt was left in w/c with all needs within reach.   She required multiple rest breaks today due to fatigue. HR 109-111BPM after standing/toileting.   Therapy Documentation Precautions:  Restrictions Weight Bearing Restrictions: No Vital Signs: Oxygen Therapy SpO2: 100 % O2 Device: Not Delivered Pain: No c/o pain during tx  Pain Assessment Pain Assessment: No/denies pain Pain Score: 0-No pain Patients Stated  Pain Goal: 2 ADL:     See Function Navigator for Current Functional Status.   Therapy/Group: Individual Therapy  Titilayo Hagans A Mashayla Lavin 01/17/2017, 12:11 PM

## 2017-01-17 NOTE — Progress Notes (Signed)
Physical Therapy Session Note  Patient Details  Name: Tracy Bridges MRN: [id number] Date of Birth: [birth year]  Today's Date: 01/17/2017 PT Individual Time: 1330-1430 PT Individual Time Calculation (min): 60 min   Short Term Goals: Week 1:  PT Short Term Goal 1 (Week 1): (P) Pt will perform sit<>stand with min assist consistently PT Short Term Goal 2 (Week 1): (P) Pt will ambulate 50 ft with RW and min assist  Skilled Therapeutic Interventions/Progress Updates:    w/c mobility training for functional UE strengthening and coordination of RUE with supervision overall and improved speed noted from last encounter with this PT. Able to negotiate home environment in apartment with supervision due to verbal cues for technique for tighter turns and obstacle negotiation. Simulated car transfer training with RW (short distance gait to/from w/c) with min assist to get into the car and mod assist to come out due to lower surface sit -> stand (best technique with RUE on RW already). Functional bed mobility in regular bed in apartment completed with overall min assist needed. Discussed home set-up as pt's bed is too high currently, but she reports she will either sleep in a different room with a lower bed or the recliner (where she was sleeping PTA). Will continue to practice. Overall sit <> stand technique improved today, though continues to require verbal cues for sequencing. Discussed DME recommendations with pt and husband (will not need equipment as she has w/c and RW available). Transferred to recliner end of session with mod assist squat pivot. LEs elevated for edema control.   Therapy Documentation Precautions:  Restrictions Weight Bearing Restrictions: No  Pain:  Denies pain.    See Function Navigator for Current Functional Status.   Therapy/Group: Individual Therapy  [name], PT, DPT  01/17/2017, 3:25 PM

## 2017-01-17 NOTE — Progress Notes (Signed)
[place] PHYSICAL MEDICINE & REHABILITATION     PROGRESS NOTE    Subjective/Complaints: Reflux seems better. Has some swelling in right leg. Happy with functional progress  ROS: pt denies nausea, vomiting, diarrhea, cough, shortness of breath or chest pain   Objective: Vital Signs: Blood pressure (!) 114/35, pulse 71, temperature 98.1 F (36.7 C), temperature source Oral, resp. rate 16, height 5\' 5"  (1.651 m), weight 75.3 kg (165 lb 14.4 oz), SpO2 100 %. No results found.  Recent Labs  01/17/17 0636  WBC 6.4  HGB 10.0*  HCT 30.9*  PLT 88*    Recent Labs  01/17/17 0636  NA 140  K 3.2*  CL 104  GLUCOSE 83  BUN 11  CREATININE 0.86  CALCIUM 8.3*   CBG (last 3)  No results for input(s): GLUCAP in the last 72 hours.  Wt Readings from Last 3 Encounters:  01/17/17 75.3 kg (165 lb 14.4 oz)  01/10/17 81.8 kg (180 lb 6.4 oz)  01/09/17 77.7 kg (171 lb 3.2 oz)    Physical Exam:  Constitutional: She is oriented to person, place, and time. She appears well-developedand well-nourished. No distress.  HENT:  Head: Normocephalicand atraumatic.  Eyes: EOMI. No discharge.  Neck: Normal range of motion. Neck supple.  Cardiovascular:RRR without murmur. No JVD   . Respiratory: CTA Bilaterally without wheezes or rales. Normal effort  GI: bowels sounds hypoactive, NT/ND  Musculoskeletal: She exhibits tr RLE edema  She exhibits no tenderness.  Neurological: She is alertand oriented to person, place, and time.  Speech clear.  Normal expression. Intact cognition/awareness. Sensory exam normal.   RUE   4+/5 deltoid, biceps, triceps, wrist, HI.   RLE: 3+/5 HF, KE,  3-/5 ADF/PF--stable.  Skin: Skin is warmand dry. Staples in scalp in place.  Psychiatric: She has a normal mood and affect. Her behavior is normal. Thought contentnormal.   Assessment/Plan: 1. 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 Physiatrist is providing close team supervision and 24 hour management of active medical problems listed below. Physiatrist and rehab team continue to assess barriers to discharge/monitor patient progress toward functional and medical goals.  Function:  Bathing Bathing position   Position: Wheelchair/chair at sink  Bathing parts Body parts bathed by patient: Right arm, Left arm, Chest, Abdomen, Right lower leg, Left lower leg, Front perineal area, Left upper leg, Right upper leg Body parts bathed by helper: Back, Buttocks  Bathing assist Assist Level: Touching or steadying assistance(Pt > 75%)      Upper Body Dressing/Undressing Upper body dressing   What is the patient wearing?: Pull over shirt/dress     Pull over shirt/dress - Perfomed by patient: Thread/unthread right sleeve, Thread/unthread left sleeve, Put head through opening, Pull shirt over trunk Pull over shirt/dress - Perfomed by helper: Pull shirt over trunk        Upper body assist Assist Level: Supervision or verbal cues   Set up : To obtain clothing/put away  Lower Body Dressing/Undressing Lower body dressing   What is the patient wearing?: Non-skid slipper socks, Pants, Underwear Underwear - Performed by patient: Thread/unthread right underwear leg, Thread/unthread left underwear leg Underwear - Performed by helper: Pull underwear up/down Pants- Performed by patient: Thread/unthread right pants leg, Thread/unthread left pants leg Pants- Performed by helper: Pull pants up/down Non-skid slipper socks- Performed by patient: Don/doff right sock, Don/doff left sock Non-skid  slipper socks- Performed by helper: Don/doff right sock, Don/doff left sock                  Lower body assist Assist for lower body dressing: Touching or steadying assistance (Pt > 75%)      Toileting Toileting   Toileting steps completed by patient: Adjust clothing  prior to toileting Toileting steps completed by helper: Performs perineal hygiene, Adjust clothing after toileting Toileting Assistive Devices: Grab bar or rail (stedy)  Toileting assist Assist level: Touching or steadying assistance (Pt.75%)   Transfers Chair/bed transfer   Chair/bed transfer method: Lateral scoot Chair/bed transfer assist level: Moderate assist (Pt 50 - 74%/lift or lower) Chair/bed transfer assistive device: Sliding board     Locomotion Ambulation     Max distance: 10' Assist level: Touching or steadying assistance (Pt > 75%)   Wheelchair   Type: Manual Max wheelchair distance: 34' Assist Level: Supervision or verbal cues  Cognition Comprehension Comprehension assist level: Follows complex conversation/direction with no assist  Expression Expression assist level: Expresses complex ideas: With no assist  Social Interaction Social Interaction assist level: Interacts appropriately with others with medication or extra time (anti-anxiety, antidepressant).  Problem Solving Problem solving assist level: Solves complex 90% of the time/cues < 10% of the time  Memory Memory assist level: More than reasonable amount of time   Medical Problem List and Plan: 1. Functional deficits and right hemiparesissecondary to metastatic lung cancer to the brain. -continue PT, OT. Moving RLE more. Still with diestal weakness 2. DVT left gastroc: Mechanical: Sequential compression devices, below knee Bilateral lower extremities, IVC filter in place.   -discussed elevation of legs when resting, compression stockings, etc  3. Headaches/Pain Management: Headaches persistent and being managed with prn hydocodone.  4. Mood: LCSW to follow for evaluation and support.  5. Neuropsych: This patient iscapable of making decisions on herown behalf. 6. Skin/Wound Care: routine pressure relief measures.  -dc staples today 7. Fluids/Electrolytes/Nutrition: Monitor I/O. BUN back to  normal 11  -continue to push fluids.    -D2 thins  -replete potassium 8. Nausea: Likely central. Continue full liquid diet. Zofran not effective--continue promethizine prn.  9. H/o A Fib: Managed with diltiazem. Monitor HR bid. HR controlled 10/14 Vitals:   01/17/17 0546 01/17/17 0934  BP: (!) 114/35   Pulse: 71   Resp: 16   Temp: 98.1 F (36.7 C)   SpO2: 96% 100%   10. Emphysema/Bronchiectasis: continue nebulizers.  chest PT bid as at home. Continue oxygen at nights and prn during the day.  11. Leucocytosis: resolved   -likely steroid related  -6.4 10/15 12. Constipation: apparent reacation to senokot-s.    -use prn miralax  -encourage fruits/fiber  -encourage PO fluids 13.  GERD-persists despite Protonix  Increased to 40mg  bid   LOS (Days) 7 A FACE TO FACE EVALUATION WAS PERFORMED  Meredith Staggers, MD 01/17/2017 10:09 AM

## 2017-01-17 NOTE — Progress Notes (Signed)
Physical Therapy Session Note  Patient Details  Name: Tracy Bridges MRN: [id number] Date of Birth: 10-24-1940  Today's Date: 01/17/2017 PT Individual Time: [phone] (makeup time) PT Individual Time Calculation (min): 30 min   Short Term Goals: Week 1:  PT Short Term Goal 1 (Week 1): (P) Pt will perform sit<>stand with min assist consistently PT Short Term Goal 2 (Week 1): (P) Pt will ambulate 50 ft with RW and min assist  Skilled Therapeutic Interventions/Progress Updates:    pt with c/o 4/10 pain from just having staples removed from head, reports premedicated.  Session focus on transfers and activity tolerance.    Pt completes 5x sit<>stand from recliner with RW, overall mod assist with verbal cues for foot placement and forward weight shift.  Best attempt (3rd) with min assist.    PT instructed pt in BLE therex x10 reps for ankle pumps, quad/glute sets, and hip abd/add to midline.  Pt positioned to comfort in recliner at end of session with call bell in reach and needs met.   Therapy Documentation Precautions:  Restrictions Weight Bearing Restrictions: No   See Function Navigator for Current Functional Status.   Therapy/Group: Individual Therapy  Michel Santee 01/17/2017, 4:38 PM

## 2017-01-17 NOTE — Progress Notes (Signed)
Physical Therapy Note  Patient Details  Name: RAMANI RIVA MRN: [id number] Date of Birth: 1940-05-11 Today's Date: 01/17/2017    Time: 1130-1200 30 minutes  1:1 NO c/o pain. Pt performed sit to stand blocked practice 2 x 5 with min/mod A with cues for UE placement.  gait 30', 20' with min A with RW with cues for deep breathing and posture.  W/c mobility with supervision and increased time in controlled environment, min A in home environment.   Brandan Glauber 01/17/2017, 12:20 PM

## 2017-01-18 ENCOUNTER — Inpatient Hospital Stay (HOSPITAL_COMMUNITY): Payer: Medicare HMO | Admitting: Occupational Therapy

## 2017-01-18 ENCOUNTER — Inpatient Hospital Stay (HOSPITAL_COMMUNITY): Payer: Medicare HMO

## 2017-01-18 DIAGNOSIS — I481 Persistent atrial fibrillation: Secondary | ICD-10-CM

## 2017-01-18 NOTE — Patient Care Conference (Signed)
Inpatient RehabilitationTeam Conference and Plan of Care Update Date: 01/18/2017   Time: 2:50 PM    Patient Name: Tracy Bridges      Medical Record Number: [id number]  Date of Birth: [birth year] Sex: Female         Room/Bed: 4W26C/4W26C-01 Payor Info: Payor: [employer] MEDICARE / Plan: [place] MEDICARE HMO/PPO / Product Type: *No Product type* /    Admitting Diagnosis: Brain Tumor DVI  Admit Date/Time:  01/10/2017  4:17 PM Admission Comments: No comment available   Primary Diagnosis:  Metastatic cancer to brain [facility]) Principal Problem: Metastatic cancer to brain [facility])  Patient Active Problem List   Diagnosis Date Noted  . Prerenal azotemia 01/12/2017  . Vascular headache   . Non-intractable vomiting with nausea   . Leukocytosis   . DVT (deep venous thrombosis) ([place]) 01/09/2017  . Metastatic cancer to brain ([place]) 01/07/2017  . Brain tumor ([place]) 12/30/2016  . 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 Atrial fibrillation ([place]) 12/26/2016  . Emphysema/COPD ([place]) 12/26/2016  . Brain mass 12/26/2016  . Paresthesia of right arm 12/26/2016  . 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 Right sided weakness   . History of lung cancer   . GERD (gastroesophageal reflux disease) 12/03/2014    Expected Discharge Date: Expected Discharge Date: 01/25/17  Team Members Present: Physician leading conference: Dr. Alger Simons Social Worker Present: Tracy Pall, LCSW Nurse Present: Tracy Pigg, RN PT Present: Tracy Bridges, PT OT Present: Tracy Bridges, OT SLP Present: Tracy Bridges, SLP PPS Coordinator present : Tracy Nakayama, RN, CRRN     Current Status/Progress Goal Weekly Team Focus  Medical   right hemiparesis improving. nausea improved/intake better, occasional GERD, RLE edema  improve exercise tolerance  nutrition, GI symptom mgt, steroid taper   Bowel/Bladder   Continent of bowel and bladder; LBM 10/16  Mod I  Assess and treat for constipation as needed   Swallow/Nutrition/ Hydration          ADL's   Min A bathing at shower level, Supervision UB dressing, Min-Mod A LB dressing (using [name]), [name] used for bathroom transfers  (Max A sit<stand), Total A toileting tasks  supervision overall   Activity tolerance, Rt NMR, general strengthening, balance, functional transfers, pt/family education    Mobility   min to mod assist for transfers with RW; min assist bed mobility; min assist gait ~ 15-20'; need to address stairs  supervision overall except min assist stairs  gait, sit <> stands, car transfer, RLE NMR, balance, gait, stairs   Communication             Safety/Cognition/ Behavioral Observations            Pain   Denies pain  < 3  Assess and treat for pain q shift and prn   Skin   Incision with scabbed areas, approximated, staples removed.  No drainage noted; scattered bruising  Mod I assist  Assess skin q shift and prn    Rehab Goals Patient on target to meet rehab goals: Yes *See Care Plan and progress notes for long and short-term goals.     Barriers to Discharge  Current Status/Progress Possible Resolutions Date Resolved   Physician    Medical stability        continued medical mgt of nutrition, GI symptoms,       Nursing                  PT  OT Medical stability                SLP                SW                Discharge Planning/Teaching Needs:  Pt to return home with her spouse who can provide 24/7 assistance.  Teaching to be completed closer to d/c.   Team Discussion:  Making good progress;  Nausea has decreased;  C/o some dizziness and feeling "weak" this morning.  Rash is interfering with sleep.  amb 20' with in assist.  Sit-stand is most difficult and can range mod - max assist.  ~ min assist with ADLs but more assist if fatigued.  Team plans to keep targeted d/c date but may need to downgrade some goals.  Revisions to Treatment Plan:  None    Continued Need for Acute Rehabilitation Level of Care: The patient requires  daily medical management by a physician with specialized training in physical medicine and rehabilitation for the following conditions: Daily direction of a multidisciplinary physical rehabilitation program to ensure safe treatment while eliciting the highest outcome that is of practical value to the patient.: Yes Daily medical management of patient stability for increased activity during participation in an intensive rehabilitation regime.: Yes Daily analysis of laboratory values and/or radiology reports with any subsequent need for medication adjustment of medical intervention for : Post surgical problems;Neurological problems;Nutritional problems  Tracy Bridges 01/18/2017, 3:28 PM

## 2017-01-18 NOTE — Progress Notes (Signed)
Recreational Therapy Session Note  Patient Details  Name: Tracy Bridges MRN: [id number] Date of Birth: [birth year] Today's Date: 01/18/2017  Pain: no c/o Skilled Therapeutic Interventions/Progress Updates:    Goal:  Pt will identify 2 ways to modify leisure tasks at discharge for continued participation. Pt participated in leisure discussion while seated in recliner.  Pt identified 3 leisure tasks that she really wanted to return to once discharged as these activities keep her physically active and emotionally/physchologically well.  Pt was able to independently list potential modifications for task completion at discharge.  Pt remains motivated and is anxious to return home, especially to see her plants.  Goal met.  [place] 01/18/2017, 2:23 PM

## 2017-01-18 NOTE — Progress Notes (Signed)
Occupational Therapy Session Note  Patient Details  Name: Tracy Bridges MRN: [id number] Date of Birth: [birth year]  Today's Date: 01/18/2017 OT Individual Time: 1130-1230 OT Individual Time Calculation (min): 60 min     Skilled Therapeutic Interventions/Progress Updates: Patient seen for energy conservation and work simplification education and right upper extremity motor control, FM and dexterity.    As well, she participated in right lattisumus stretching to increase flexion and abduction past 145 degrees.    Patient was assisted with a squat piviot with max assist to her right from w/c to recliner chair and call bell and phone were placed within reach.      Therapy/Group: Individual Therapy  Alfredia Ferguson [facility] 01/18/2017, 1:50 PM

## 2017-01-18 NOTE — Progress Notes (Signed)
Physical Therapy Session Note  Patient Details  Name: Tracy Bridges MRN: [id number] Date of Birth: Dec 04, 1940  Today's Date: 01/18/2017 PT Individual Time: 0950-1045 PT Individual Time Calculation (min): 55 min   Skilled Therapeutic Interventions/Progress Updates:    Session started late due to respiratory treatment care and pt missed 20 min of skilled PT time.   Focused on functional transfers with RW throughout session with focus on technique and hand placement. Continues to require up to mod assist (min assist at times) for sit -> stand. Gait training throughout session x 10' x 2 and max distance of 24' with min assist overall - cues for widening BOS and placement of RLE (maintains very narrow BOS). Initiated stair negotiation for preparation for home entry with step ups onto 3" step using rails for support; overall min assist blocked practice. RLE buckled after 9 reps, requiring total assist to maintain squat position and +2 to come back up to return to w/c for safety. D/c planning and home environment mobility addressed with discussion of goals and potential to use w/c vs RW sometimes upon d/c. Nustep for NMR for reciprocal movement pattern re-training and functional strengthening x 5.5 min on level 6.   Therapy Documentation Precautions:  Restrictions Weight Bearing Restrictions: No  Pain: Pain Assessment Pain Assessment: No/denies pain Pain Score: 0-No pain   See Function Navigator for Current Functional Status.   Therapy/Group: Individual Therapy  [name], PT, DPT  01/18/2017, 12:11 PM

## 2017-01-18 NOTE — Progress Notes (Signed)
[place] PHYSICAL MEDICINE & REHABILITATION     PROGRESS NOTE    Subjective/Complaints: Didn't sleep as well as she was turned/preventative measures taken for skin. Feels a bit "shaky" this morning  ROS: pt denies nausea, vomiting, diarrhea, cough, shortness of breath or chest pain   Objective: Vital Signs: Blood pressure 124/64, pulse 87, temperature 98.4 F (36.9 C), temperature source Oral, resp. rate 16, height 5\' 5"  (1.651 m), weight 80 kg (176 lb 5.9 oz), SpO2 100 %. No results found.  Recent Labs  01/17/17 0636  WBC 6.4  HGB 10.0*  HCT 30.9*  PLT 88*    Recent Labs  01/17/17 0636  NA 140  K 3.2*  CL 104  GLUCOSE 83  BUN 11  CREATININE 0.86  CALCIUM 8.3*   CBG (last 3)  No results for input(s): GLUCAP in the last 72 hours.  Wt Readings from Last 3 Encounters:  01/18/17 80 kg (176 lb 5.9 oz)  01/10/17 81.8 kg (180 lb 6.4 oz)  01/09/17 77.7 kg (171 lb 3.2 oz)    Physical Exam:  Constitutional: She is oriented to person, place, and time. She appears well-developedand well-nourished. No distress but does look a little fatigued.  HENT:  Head: Normocephalicand atraumatic.  Eyes: EOMI. No discharge.  Neck: Normal range of motion. Neck supple.  Cardiovascular:RRR without murmur. No JVD   . Respiratory: CTA Bilaterally without wheezes or rales. Normal effort   GI: bowels sounds hypoactive, NT/ND  Musculoskeletal: She exhibits tr RLE edema  She exhibits no tenderness.  Neurological: She is alertand oriented to person, place, and time.  Speech clear.  Normal expression. Intact cognition/awareness. Sensory exam normal.   RUE   4+/5 deltoid, biceps, triceps, wrist, HI.   RLE: 3+/5 HF, KE,  3-/5 ADF/PF--  Motor exam stable today.  Skin: Skin is warmand dry. Staples in scalp in place.  Psychiatric: She has a normal mood and affect. Her behavior is normal. Thought contentnormal.   Assessment/Plan: 1. Functional deficits and right hemiparesis secondary to  metastatic lung cancer to the brain which require 3+ hours per day of interdisciplinary therapy in a comprehensive inpatient rehab setting. Physiatrist is providing close team supervision and 24 hour management of active medical problems listed below. Physiatrist and rehab team continue to assess barriers to discharge/monitor patient progress toward functional and medical goals.  Function:  Bathing Bathing position   Position: Wheelchair/chair at sink  Bathing parts Body parts bathed by patient: Right arm, Left arm, Chest, Abdomen, Right lower leg, Left lower leg, Front perineal area, Left upper leg, Right upper leg Body parts bathed by helper: Back, Buttocks  Bathing assist Assist Level: Touching or steadying assistance(Pt > 75%)      Upper Body Dressing/Undressing Upper body dressing   What is the patient wearing?: Pull over shirt/dress     Pull over shirt/dress - Perfomed by patient: Thread/unthread right sleeve, Thread/unthread left sleeve, Put head through opening, Pull shirt over trunk Pull over shirt/dress - Perfomed by helper: Pull shirt over trunk        Upper body assist Assist Level: Supervision or verbal cues   Set up : To obtain clothing/put away  Lower Body Dressing/Undressing Lower body dressing   What is the patient wearing?: Non-skid slipper socks, Ted Hose Underwear - Performed by patient: Thread/unthread right underwear leg, Thread/unthread left underwear leg Underwear - Performed by helper: Pull underwear up/down Pants- Performed by patient: Thread/unthread right pants leg, Thread/unthread left pants leg Pants- Performed by helper:  Pull pants up/down Non-skid slipper socks- Performed by patient: Don/doff right sock, Don/doff left sock Non-skid slipper socks- Performed by helper: Don/doff right sock, Don/doff left sock               TED Hose - Performed by helper: Don/doff right TED hose, Don/doff left TED hose  Lower body assist Assist for lower body  dressing: Touching or steadying assistance (Pt > 75%)      Toileting Toileting   Toileting steps completed by patient: Adjust clothing prior to toileting, Adjust clothing after toileting Toileting steps completed by helper: Performs perineal hygiene Toileting Assistive Devices: Grab bar or rail  Toileting assist Assist level: Touching or steadying assistance (Pt.75%)   Transfers Chair/bed transfer   Chair/bed transfer method: Stand pivot Chair/bed transfer assist level: Touching or steadying assistance (Pt > 75%) Chair/bed transfer assistive device: Bedrails     Locomotion Ambulation     Max distance: 15' Assist level: Touching or steadying assistance (Pt > 75%)   Wheelchair   Type: Manual Max wheelchair distance: 100' Assist Level: Supervision or verbal cues  Cognition Comprehension Comprehension assist level: Follows complex conversation/direction with no assist  Expression Expression assist level: Expresses complex ideas: With extra time/assistive device  Social Interaction Social Interaction assist level: Interacts appropriately with others - No medications needed.  Problem Solving Problem solving assist level: Solves complex 90% of the time/cues < 10% of the time  Memory Memory assist level: More than reasonable amount of time   Medical Problem List and Plan: 1. Functional deficits and right hemiparesissecondary to metastatic lung cancer to the brain. -continue PT, OT. Moving RLE more. Persistent distal weakness  -team conference today 2. DVT left gastroc: Mechanical: Sequential compression devices, below knee Bilateral lower extremities, IVC filter in place.   -discussed elevation of legs when resting, compression stockings, etc  3. Headaches/Pain Management: Headaches persistent and being managed with prn hydocodone.  4. Mood: LCSW to follow for evaluation and support.  5. Neuropsych: This patient iscapable of making decisions on herown behalf. 6.  Skin/Wound Care: routine pressure relief measures.  -dc staples today 7. Fluids/Electrolytes/Nutrition: Monitor I/O. BUN back to normal 11  -continue to push fluids.    -D2 thins  -repleting potassium 8. Nausea: Likely central. Continue full liquid diet. Zofran not effective--continue promethizine prn.  9. H/o A Fib: Managed with diltiazem. Monitor HR bid. HR controlled 10/14 Vitals:   01/18/17 0933 01/18/17 0934  BP:    Pulse: 87   Resp: 16   Temp:    SpO2: 100% 100%   10. Emphysema/Bronchiectasis: continue nebulizers.  chest PT bid as at home. Continue oxygen at nights and prn during the day.  11. Leucocytosis: resolved   -likely steroid related  -6.4 10/15 12. Constipation: apparent reacation to senokot-s.    -use prn miralax  -encourage fruits/fiber  -encourage PO fluids 13.  GERD-  Protonix  Increased to 40mg  bid with improvement   LOS (Days) 8 A FACE TO FACE EVALUATION WAS PERFORMED  Meredith Staggers, MD 01/18/2017 11:18 AM

## 2017-01-18 NOTE — Plan of Care (Signed)
Problem: RH Car Transfers Goal: LTG Patient will perform car transfers with assist (PT) LTG: Patient will perform car transfers with assistance (PT).  Downgraded to min assist for low surface transfer ABG

## 2017-01-18 NOTE — Progress Notes (Signed)
Pt resting in bed quietly. Easily aroused. Denies any pain at this time. drsg removed from buttock during toileting and erythematous, scaling rash is noted. Nystatin cream and topical powder applied after peri care and pt is educated to reposition frequently from side to side to assist in skin healing. Same rash is also noted underneath abdominal fold and nystatin cream and powder is applied to it also. Spouse at bedside and very engaging in conversation, and supportive to pt. Incision line to pt's scalp is well approximated with intermittent scabbing along and is dry. Continent of b/b and uses the stedy for assist to toileting. Able to make needs known, safety maintained. callbell within reach. Will continue to monitor.

## 2017-01-18 NOTE — Progress Notes (Signed)
Occupational Therapy Session Note  Patient Details  Name: Tracy Bridges MRN: [id number] Date of Birth: [birth year]  Today's Date: 01/18/2017 OT Individual Time: 2902-1115 OT Individual Time Calculation (min): 57 min    Skilled Therapeutic Interventions/Progress Updates:    1:1. Pt with no c/o pain. Pt eating in recliner with Mod I. Participating in discussion about d/c planning while eating. Pt with burping after meal and requires VC for calm breathing. Pt sit to stand in stedy with MOD A for lifting and VC for posture to transfer into TTB. Pt bathes at sit to stand level with A to wash butocks. Pt dresses seated on TTB with A to bring RLE to thread underwear/socks and A to advance pants past hips. Pt reporting SOB but vitals WNL. Exited session with pt seated in recliner, husband present and call light in reach.  Therapy Documentation Precautions:  Restrictions Weight Bearing Restrictions: No General:    See Function Navigator for Current Functional Status.   Therapy/Group: Individual Therapy  Tonny Branch 01/18/2017, 8:12 AM

## 2017-01-19 ENCOUNTER — Inpatient Hospital Stay (HOSPITAL_COMMUNITY): Payer: Medicare HMO | Admitting: Occupational Therapy

## 2017-01-19 ENCOUNTER — Encounter (HOSPITAL_COMMUNITY): Payer: Self-pay

## 2017-01-19 ENCOUNTER — Inpatient Hospital Stay (HOSPITAL_COMMUNITY): Payer: Medicare HMO

## 2017-01-19 ENCOUNTER — Inpatient Hospital Stay (HOSPITAL_COMMUNITY): Payer: Medicare HMO | Admitting: Physical Therapy

## 2017-01-19 DIAGNOSIS — R531 Weakness: Secondary | ICD-10-CM

## 2017-01-19 MED ORDER — POTASSIUM CHLORIDE 20 MEQ/15ML (10%) PO SOLN
20.0000 meq | Freq: Every day | ORAL | Status: DC
  Administered 2017-01-20 – 2017-01-28 (×9): 20 meq via ORAL
  Filled 2017-01-19 (×9): qty 15

## 2017-01-19 MED ORDER — GERHARDT'S BUTT CREAM
TOPICAL_CREAM | Freq: Three times a day (TID) | CUTANEOUS | Status: DC | PRN
  Administered 2017-01-19 – 2017-01-27 (×7): via TOPICAL
  Filled 2017-01-19 (×2): qty 1

## 2017-01-19 NOTE — Progress Notes (Signed)
Physical Therapy Weekly Progress Note  Patient Details  Name: Tracy Bridges MRN: [id number] Date of Birth: 08-16-1940  Beginning of progress report period: January 11, 2017 End of progress report period: January 19, 2017  Today's Date: 01/19/2017 PT Individual Time: 1000-1100 PT Individual Time Calculation (min): 60 min  Session focused on w/c mobility training for functional strengthening and endurance, blocked practice sit <> stands from EOM with min assist today and cues for technique, functional transfers throughout session with overall min assist and cues for hand placement, simulated car transfer with RW including short distance gait to/from car (x8') with min assist getting into car but mod assist for sit <> stand out of car. Gait training on carpeted surface x 12' with min assist with cues for knee extension activation in stance. Pt c/o becoming hot and dizzy and required seated rest break.  Vitals assessed. BP = 125/43mHg and HR = 89-93 bpm. Resolved in about 5 min with seated rest break. Completed seated RLE LAQ with 5 second hold and dorsiflexion x 12 reps before returned to room. Min assist transfer into recliner with RW and cues for sequencing.    Patient has met 0 of 2 short term goals. Pt continues to require overall mod assist for sit <> stands, occasionally at min assist level. Increased difficulty from lower surfaces. Max distance for gait about 20' due to fatigue. Pt does require cues for sequencing during all mobility.   Patient continues to demonstrate the following deficits muscle weakness and muscle joint tightness, decreased cardiorespiratoy endurance, decreased problem solving and decreased memory and decreased standing balance, decreased postural control and decreased balance strategies and therefore will continue to benefit from skilled PT intervention to increase functional independence with mobility.  Patient progressing toward long term goals..  Continue plan of care.  Husband may need to be able to provide intermittent min assist for transfers if sit <> stands do not become more consistent.   PT Short Term Goals Week 1:  PT Short Term Goal 1 (Week 1): (P) Pt will perform sit<>stand with min assist consistently PT Short Term Goal 1 - Progress (Week 1): Not met PT Short Term Goal 2 (Week 1): (P) Pt will ambulate 50 ft with RW and min assist PT Short Term Goal 2 - Progress (Week 1): Not met Week 2:  PT Short Term Goal 1 (Week 2): = LTGS  Skilled Therapeutic Interventions/Progress Updates:  Ambulation/gait training;Disease management/prevention;Pain management;Stair training;Visual/perceptual remediation/compensation;Wheelchair propulsion/positioning;Therapeutic Activities;Patient/family education;DME/adaptive equipment instruction;Balance/vestibular training;Cognitive remediation/compensation;Psychosocial support;Therapeutic Exercise;UE/LE Strength taining/ROM;Skin care/wound management;Functional mobility training;Community reintegration;Discharge planning;Neuromuscular re-education;Splinting/orthotics;UE/LE Coordination activities   Therapy Documentation Precautions:  Restrictions Weight Bearing Restrictions: No   Pain: Pain Assessment Pain Assessment: No/denies pain Pain Score: 0-No pain   See Function Navigator for Current Functional Status.  Therapy/Group: Individual Therapy  GCanary BrimBChildren'S National Medical Center10/17/2018, 12:10 PM

## 2017-01-19 NOTE — Plan of Care (Signed)
Problem: RH SKIN INTEGRITY Goal: RH STG SKIN FREE OF INFECTION/BREAKDOWN Skin to remain clean, dry, and free of infection or breakdown while on rehab with min assist.  Outcome: Progressing Yeast infection improving.

## 2017-01-19 NOTE — Progress Notes (Signed)
Pt resting in bed quietly. Easily aroused. Denies any pain at this time. Safety maintained. callbell within reach. Refused SCDs this shift and states "I want to turn over and had a hard time doing that last night. Yeast rash noted to buttocks and abdominal fold with slight erythema and scale noted. Nystatin topical applied per order and effective. Spouse not at bedside this shift. Continues on chest PT with respiratory. Continent of b/b.  Upon assessment, has a difficult time standing and supporting weight. stedy used to assist in mobility to / from bathroom. A & O x 3. Well approximated and healing incision is noted to scalp with intermittent scabbing. callbell within reach. Safety maintained. Will continue to monitor.

## 2017-01-19 NOTE — Progress Notes (Signed)
Physical Therapy Session Note  Patient Details  Name: Tracy Bridges MRN: [id number] Date of Birth: 01/23/1941  Today's Date: 01/19/2017 PT Individual Time: 1505-1535 PT Individual Time Calculation (min): 30 min   Short Term Goals: Week 1:  PT Short Term Goal 1 (Week 1): (P) Pt will perform sit<>stand with min assist consistently PT Short Term Goal 2 (Week 1): (P) Pt will ambulate 50 ft with RW and min assist   Skilled Therapeutic Interventions/Progress Updates: Pt presented in w/c agreeable to therapy. Transported to rehab gym for time management. Pt performed stand pivot to mat, sit to stand minA from w/c. Pt participated in sit to/from stand transfers from 22", 21", 20" respectively. Pt required breaks between due to fatigue. Performed ambulatory transfer back to w/c and pt propelled approx 100ft for BUE strengthening and endurance. Pt transported back remaining distance back to room. Pt remained in w/c at end of session in room with family present and needs met.      Therapy Documentation Precautions:  Restrictions Weight Bearing Restrictions: No    See Function Navigator for Current Functional Status.   Therapy/Group: Individual Therapy  Rosita DeChalus  Rosita DeChalus, PTA  01/19/2017, 3:43 PM  

## 2017-01-19 NOTE — Progress Notes (Signed)
[facility]     PROGRESS NOTE    Subjective/Complaints: Had a better night. Actually made it through day without issues yesterday.   ROS: pt denies nausea, vomiting, diarrhea, cough, shortness of breath or chest pain   Objective: Vital Signs: Blood pressure 125/65, pulse 98, temperature 98.1 F (36.7 C), temperature source Oral, resp. rate 18, height 5\' 5"  (1.651 m), weight 79.1 kg (174 lb 6.1 oz), SpO2 100 %. No results found.  Recent Labs  01/17/17 0636  WBC 6.4  HGB 10.0*  HCT 30.9*  PLT 88*    Recent Labs  01/17/17 0636  NA 140  K 3.2*  CL 104  GLUCOSE 83  BUN 11  CREATININE 0.86  CALCIUM 8.3*   CBG (last 3)  No results for input(s): GLUCAP in the last 72 hours.  Wt Readings from Last 3 Encounters:  01/19/17 79.1 kg (174 lb 6.1 oz)  01/10/17 81.8 kg (180 lb 6.4 oz)  01/09/17 77.7 kg (171 lb 3.2 oz)    Physical Exam:  Constitutional: She is oriented to person, place, and time. She appears well-developedand well-nourished. No distress but does look a little fatigued.  HENT:  Head: Normocephalicand atraumatic.  Eyes: EOMI. No discharge.  Neck: Normal range of motion. Neck supple.  Cardiovascular: RRR without murmur. No JVD  . Respiratory: CTA Bilaterally without wheezes or rales. Normal effort    GI: BS +, non-tender, non-distended   Musculoskeletal: She exhibits tr RLE edema  She exhibits no tenderness.  Neurological: She is alertand oriented to person, place, and time.  Speech clear.  Normal expression. Intact cognition/awareness. Sensory exam normal.   RUE   4+/5 deltoid, biceps, triceps, wrist, HI.   RLE: 3+/5 HF, KE,  3- to 3/5 ADF/PF--    Skin: Skin is warmand dry. Staples in scalp in place.  Psychiatric: She has a normal mood and affect. Her behavior is normal. Thought contentnormal.   Assessment/Plan: 1. 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 Physiatrist is providing close team supervision and 24 hour management of active medical problems listed below. Physiatrist and rehab team continue to assess barriers to discharge/monitor patient progress toward functional and medical goals.  Function:  Bathing Bathing position   Position: Wheelchair/chair at sink  Bathing parts Body parts bathed by patient: Right arm, Left arm, Chest, Abdomen, Right lower leg, Left lower leg, Front perineal area, Left upper leg, Right upper leg Body parts bathed by helper: Back, Buttocks  Bathing assist Assist Level: Touching or steadying assistance(Pt > 75%)      Upper Body Dressing/Undressing Upper body dressing   What is the patient wearing?: Pull over shirt/dress     Pull over shirt/dress - Perfomed by patient: Thread/unthread right sleeve, Thread/unthread left sleeve, Put head through opening, Pull shirt over trunk Pull over shirt/dress - Perfomed by helper: Pull shirt over trunk        Upper body assist Assist Level: Supervision or verbal cues   Set up : To obtain clothing/put away  Lower Body Dressing/Undressing Lower body dressing   What is the patient wearing?: Non-skid slipper socks, Ted Hose Underwear - Performed by patient: Thread/unthread right underwear leg, Thread/unthread left underwear leg Underwear - Performed by helper: Pull underwear up/down Pants- Performed by patient: Thread/unthread right pants leg, Thread/unthread left pants leg Pants- Performed by helper: Pull pants up/down Non-skid  slipper socks- Performed by patient: Don/doff right sock, Don/doff left sock Non-skid slipper socks- Performed by helper: Don/doff right sock, Don/doff left sock               TED Hose - Performed by helper: Don/doff right TED hose, Don/doff left TED hose  Lower body assist Assist for lower body dressing: Touching or steadying assistance  (Pt > 75%)      Toileting Toileting   Toileting steps completed by patient: Adjust clothing prior to toileting, Adjust clothing after toileting Toileting steps completed by helper: Performs perineal hygiene Toileting Assistive Devices: Grab bar or rail  Toileting assist Assist level: Touching or steadying assistance (Pt.75%)   Transfers Chair/bed transfer   Chair/bed transfer method: Stand pivot Chair/bed transfer assist level: Touching or steadying assistance (Pt > 75%) Chair/bed transfer assistive device: Armrests, Medical sales representative     Max distance: 12' Assist level: Touching or steadying assistance (Pt > 75%)   Wheelchair   Type: Manual Max wheelchair distance: 120' Assist Level: Supervision or verbal cues  Cognition Comprehension Comprehension assist level: Follows complex conversation/direction with extra time/assistive device  Expression Expression assist level: Expresses complex ideas: With extra time/assistive device  Social Interaction Social Interaction assist level: Interacts appropriately with others - No medications needed.  Problem Solving Problem solving assist level: Solves complex 90% of the time/cues < 10% of the time  Memory Memory assist level: More than reasonable amount of time   Medical Problem List and Plan: 1. Functional deficits and right hemiparesissecondary to metastatic lung cancer to the brain. -continue CIR therapies.  2. DVT left gastroc: Mechanical: Sequential compression devices, below knee Bilateral lower extremities, IVC filter in place.   -discussed elevation of legs when resting, compression stockings, etc  3. Headaches/Pain Management: Headaches persistent and being managed with prn hydocodone.  4. Mood: LCSW to follow for evaluation and support.  5. Neuropsych: This patient iscapable of making decisions on herown behalf. 6. Skin/Wound Care: routine pressure relief measures.  --staples out 7.  Fluids/Electrolytes/Nutrition: Monitor I/O. BUN back to normal 11  -continue to push fluids.    -D2 thins  -repleting potassium 8. Nausea: Likely central. Continue full liquid diet. Zofran not effective--continue promethizine prn.  9. H/o A Fib: Managed with diltiazem. Monitor HR bid. HR controlled 10/14 Vitals:   01/19/17 0900 01/19/17 1050  BP:  125/65  Pulse: 93 98  Resp: 18   Temp:    SpO2: 100%    10. Emphysema/Bronchiectasis: continue nebulizers.  chest PT bid as at home. Continue oxygen at nights and prn during the day.  11. Leucocytosis: resolved   -likely steroid related  -6.4 10/15 12. Constipation: apparent reacation to senokot-s.    -use prn miralax  -encourage fruits/fiber  -encourage PO fluids 13.  GERD-  Protonix  Increased to 40mg  bid with improvement   LOS (Days) 9 A FACE TO FACE EVALUATION WAS PERFORMED  Meredith Staggers, MD 01/19/2017 12:41 PM

## 2017-01-19 NOTE — Progress Notes (Signed)
Occupational Therapy Session Note  Patient Details  Name: Tracy Bridges MRN: [id number] Date of Birth: May 30, 1940  Today's Date: 01/19/2017 OT Individual Time: 0915-1000 and 1400-1445 OT Individual Time Calculation (min): 45 min  and 45 min  Today's Date: 01/19/2017 OT Missed Time: 15 Minutes Missed Time Reason: Other (comment) (respiratory therapy giving chest therapy)   Short Term Goals: Week 1:  OT Short Term Goal 1 (Week 1): Pt will complete bathing at sit<stand level with Mod A OT Short Term Goal 2 (Week 1): Pt will complete toileting tasks with 1 helper OT Short Term Goal 3 (Week 1): Pt will complete toilet transfer with mod A and LRAD  Skilled Therapeutic Interventions/Progress Updates:    Session 1: Pt missed 15 minutes of time secondary to respiratory therapy giving treatment. Pt seated in wheelchair awaiting therapist arrival with no c/o pain this session. Pt requesting assistance at sink to get soap from hair as she reports it is making her itch. OT assisted pt with task at sink. Pt performed grooming tasks with increased time while seated in wheelchair. Pt remained in chair with call bell and all needed items within reach upon exiting the room.    Session 2: Upon entering the room, pt seated in wheelchair and just returned from bathroom. Pt requesting to continue to work on R UE [facility]. Pt given playing cards for palmar translation task and flipping of cards. Pt able to complete task but with increased time. Pt reviewing paper handout for theraputty exercises for R UE with use of red putty. Pt performing exercises on handout with min verbal cues for proper technique. Call bell and all needed items within reach upon exiting the room.   Therapy Documentation Precautions:  Restrictions Weight Bearing Restrictions: No General: General OT Amount of Missed Time: 15 Minutes Vital Signs: Therapy Vitals Pulse Rate: 93 Resp: 18 Patient Position (if appropriate): Sitting Oxygen  Therapy SpO2: 100 % O2 Device: Not Delivered Pain: Pain Assessment Pain Assessment: No/denies pain Pain Score: 0-No pain  See Function Navigator for Current Functional Status.   Therapy/Group: Individual Therapy  Gypsy Decant 01/19/2017, 10:00 AM

## 2017-01-19 NOTE — Progress Notes (Signed)
Patient with shearing to buttocks from yeast.  Nystatin cream ineffective thus far at clearing condition.  Spoke with Marlowe Shores, PA about trying gerhardts butt cream.  Order received.  Upon entering, flagged about potential allergy to corticosteroids with fluticasone causing anaphylaxis.  Pharmacy called for input.  Advised that topical cream would rarely cause anaphylaxis and if she had a reaction, it may be rash related.  Her recommendations was to try it.  Brita Romp, RN

## 2017-01-20 ENCOUNTER — Inpatient Hospital Stay (HOSPITAL_COMMUNITY): Payer: Medicare HMO | Admitting: Occupational Therapy

## 2017-01-20 ENCOUNTER — Telehealth: Payer: Self-pay | Admitting: Radiation Therapy

## 2017-01-20 ENCOUNTER — Inpatient Hospital Stay (HOSPITAL_COMMUNITY): Payer: Medicare HMO

## 2017-01-20 LAB — BASIC METABOLIC PANEL
ANION GAP: 7 (ref 5–15)
BUN: 13 mg/dL (ref 6–20)
CHLORIDE: 108 mmol/L (ref 101–111)
CO2: 26 mmol/L (ref 22–32)
Calcium: 8.6 mg/dL — ABNORMAL LOW (ref 8.9–10.3)
Creatinine, Ser: 0.91 mg/dL (ref 0.44–1.00)
GFR calc Af Amer: >60 mL/min (ref >60)
GFR, EST NON AFRICAN AMERICAN: 60 mL/min — AB (ref >60)
GLUCOSE: 83 mg/dL (ref 65–99)
POTASSIUM: 3.8 mmol/L (ref 3.5–5.1)
Sodium: 141 mmol/L (ref 135–145)

## 2017-01-20 NOTE — Progress Notes (Signed)
Physical Therapy Session Note  Patient Details  Name: Tracy Bridges MRN: [id number] Date of Birth: 05-09-1940  Today's Date: 01/20/2017 PT Individual Time: 1415-1530 PT Individual Time Calculation (min): 75 min   Short Term Goals: Week 2:  PT Short Term Goal 1 (Week 2): = LTGS  Skilled Therapeutic Interventions/Progress Updates:   w/c mobility with BUE/BLE x 50' with supervision for functional strengthening and endurance training - pt reports R hand feels weaker today and demonstrates increased time needed for propulsion. Nustep for NMR for reciprocal movement pattern re-training and forced use of RUE/RLE x 8 min with rest breaks as needed due to pt c/o feeling hot. Min assist for transfers with RW but requires extra time today and decreased clearance of RLE during mobility noted. Pt reports feeling a spinning sensation during sit <> stands. Had pt perform vestibular exercises for habituation with horizontal and vertical head turns as pt reports this is when it causes her to feel that sensation. Educated on performing those 3 x per day in room. Pt only able to turn head very slowly. Performed fine motor RUE pill container and pill manipulation task to address strengthening and coordination in seated position. Returned to room and transferred to recliner with min assist (required 2 attempts to complete sit ->) stand with RW and cues for hand placement.   Discussed d/c planning with pt and husband and inconsistency with pt's level of assist. Recommending downgrading of goals to min assist due to inconsistency with recommendation for use of w/c in the home for majority of the time and only walk short distances with RW in the home when pt feeling up to it (limited by dizziness, weakness, and c/o feeling leg is "heavy"). Both in agreement though husband states he thinks she will have a "boost" of energy when she gets home. Will notify CSW as well and recommended husband come to therapy sessions over next  several days to begin to practice hands on training. Both in agreement.   Therapy Documentation Precautions:  Precautions Precautions: Fall Restrictions Weight Bearing Restrictions: No  Pain:  Denies pain. C/o dizziness during transitional movements during session.    See Function Navigator for Current Functional Status.   Therapy/Group: Individual Therapy  [name], PT, DPT  01/20/2017, 3:47 PM

## 2017-01-20 NOTE — Progress Notes (Signed)
Occupational Therapy Session Note  Patient Details  Name: Tracy Bridges MRN: [id number] Date of Birth: 04-23-1940  Today's Date: 01/20/2017 OT Individual Time: 2297-9892 OT Individual Time Calculation (min): 54 min    Short Term Goals: Week 1:  OT Short Term Goal 1 (Week 1): Pt will complete bathing at sit<stand level with Mod A OT Short Term Goal 2 (Week 1): Pt will complete toileting tasks with 1 helper OT Short Term Goal 3 (Week 1): Pt will complete toilet transfer with mod A and LRAD  Skilled Therapeutic Interventions/Progress Updates:    Pt completed washing her hands at the sink with supervision to start session.  Therapist then pushed her to the dayroom for work on Clinical research associate.  Had pt complete 3 sets of 3 mins on the UE ergonometer with resistance set on level 1.  First set completed using BUEs with RPMs maintained at 25-27.  Second and 3rd sets completed with just isolated use of the RUE for 3 mins each.  RPMs kept at 15-19 during these 2 sets.  Transitioned to sitting at the table and working on picking up 2" pegs and placing in grid with the RUE.  Had her also work on picking up 2 pegs, one at a time, and then working on manipulating from palm to fingertips to place in the grid.  Pt with 90% accuracy with this task.  Finished session with return back to room with spouse present and call button and phone in reach.    Therapy Documentation Precautions:  Precautions Precautions: Fall Restrictions Weight Bearing Restrictions: No  Pain: Pain Assessment Pain Assessment: No/denies pain Pain Score: 0-No pain ADL: See Function Navigator for Current Functional Status.   Therapy/Group: Individual Therapy  Brunella Wileman OTR/L 01/20/2017, 12:38 PM

## 2017-01-20 NOTE — Plan of Care (Signed)
Problem: RH Bed to Chair Transfers Goal: LTG Patient will perform bed/chair transfers w/assist (PT) LTG: Patient will perform bed/chair transfers with assistance, with/without cues (PT).  Downgraded due to inconsistency. ABG  Problem: RH Ambulation Goal: LTG Patient will ambulate in home environment (PT) LTG: Patient will ambulate in home environment, # of feet with assistance (PT).  Downgraded due to inconsistency. ABG   Problem: RH Stairs Goal: LTG Patient will ambulate up and down stairs w/assist (PT) LTG: Patient will ambulate up and down # of stairs with assistance (PT)  Downgraded due to inconsistency. ABG

## 2017-01-20 NOTE — Plan of Care (Signed)
Problem: RH Balance Goal: LTG Patient will maintain dynamic standing with ADLs (OT) LTG:  Patient will maintain dynamic standing balance with assist during activities of daily living (OT)   Downgraded secondary to safety and inconsistency  Problem: RH Bathing Goal: LTG Patient will bathe with assist, cues/equipment (OT) LTG: Patient will bathe specified number of body parts with assist with/without cues using equipment (position)  (OT)  Downgraded secondary to safety and inconsistency  Problem: RH Dressing Goal: LTG Patient will perform lower body dressing w/assist (OT) LTG: Patient will perform lower body dressing with assist, with/without cues in positioning using equipment (OT)  Downgraded secondary to safety and inconsistency  Problem: RH Toileting Goal: LTG Patient will perform toileting w/assist, cues/equip (OT) LTG: Patient will perform toiletiing (clothes management/hygiene) with assist, with/without cues using equipment (OT)  Downgraded secondary to safety and inconsistency  Problem: RH Toilet Transfers Goal: LTG Patient will perform toilet transfers w/assist (OT) LTG: Patient will perform toilet transfers with assist, with/without cues using equipment (OT)  Downgraded secondary to safety and inconsistency

## 2017-01-20 NOTE — Progress Notes (Signed)
Social Work Patient ID: Tracy Bridges, female   DOB: [birth year], 76 y.o.   MRN: [id number]   Met with pt and spouse yesterday afternoon to review team conference. They are aware that team continues to plan toward a 10/23 d/c with supervision to minimal assistance goals.  Discussed need to complete family ed prior to d/c and husband understands.  Will continue to follow.  Gillie Crisci, LCSW

## 2017-01-20 NOTE — Telephone Encounter (Signed)
I learned today that Tracy Bridges will be discharged from inpatient rehab on 10/23. Based on that date, I had set her up to see Dr. Lisbeth Renshaw and Dr. Mickeal Skinner for consult on 10/25. I then called her husband, Tracy Bridges,  to inform him of her upcoming appointments. [place] asked that I cancel those visits because she will be seen and treated in [place] instead. I asked if she already had appointments for them and replied that she does.  Tracy Bridges, Dr. Ida Rogue PA and Dr. Renda Rolls nurse Tracy Bridges, have been informed of this change.  Tracy Bridges R.T.(R)(T) Special Procedures Navigator

## 2017-01-20 NOTE — Plan of Care (Signed)
Problem: RH SKIN INTEGRITY Goal: RH STG SKIN FREE OF INFECTION/BREAKDOWN Skin to remain clean, dry, and free of infection or breakdown while on rehab with min assist.  Outcome: Progressing MASD to buttocks getting better with gerhardts

## 2017-01-20 NOTE — Progress Notes (Signed)
Occupational Therapy Weekly Progress Note  Patient Details  Name: Tracy Bridges MRN: [id number] Date of Birth: 1940/10/14  Beginning of progress report period: January 11, 2017 End of progress report period: January 20, 2017  Today's Date: 01/20/2017 OT Individual Time: 4128-7867 Total Time Calculation (min): 72 min   Patient has met 2 of 3 short term goals.  Pt is making progress towards occupational therapy goals. Pt's barriers include level of fatigue causing fluctuation in level of assistance.  Pt can perform functional transfer at mod A level and has been bathing from shower and sink this week. Pt is motivated for OT intervention. Pt has started a [facility] and R hand strengthening HEP.  Patient continues to demonstrate the following deficits: muscle weakness, decreased cardiorespiratoy endurance and decreased oxygen support, decreased initiation, decreased attention, decreased awareness, decreased problem solving and decreased safety awareness and decreased sitting balance, decreased standing balance, decreased balance strategies and coordination and therefore will continue to benefit from skilled OT intervention to enhance overall performance with BADL.  Patient not progressing toward long term goals.  See goal revision..  Plan of care revisions: Goals for balance, bathing, and toileting downgraded to min A secondary to safety and  inconsistency .  OT Short Term Goals Week 1:  OT Short Term Goal 1 (Week 1): Pt will complete bathing at sit<stand level with Mod A OT Short Term Goal 1 - Progress (Week 1): Progressing toward goal OT Short Term Goal 2 (Week 1): Pt will complete toileting tasks with 1 helper OT Short Term Goal 2 - Progress (Week 1): Met OT Short Term Goal 3 (Week 1): Pt will complete toilet transfer with mod A and LRAD OT Short Term Goal 3 - Progress (Week 1): Met Week 2:  OT Short Term Goal 1 (Week 2): STGs=LTGs secondary to upcoming discharge  Skilled Therapeutic  Interventions/Progress Updates:    Upon entering the room, pt seated in wheelchair awaiting therapist arrival. Pt verbalized need to toilet but declined to attempt ambulation into bathroom. Pt transferred from wheelchair >toilet with max A for lifting and lowering. Pt reporting weakness and feelings of fatigue throughout session. Pt having BM and clothing removed while seated on toilet. Pt standing inside of [name] for energy conservation with assistance needed for hygiene and pt transferred onto TTB from [name] with mod A for lifting. Pt bathing self with assistance to wash buttocks and lower legs and feet. Pt donning LB clothing while seated on TTB. Pt standing with assistance for balance and to pull clothing over B hips. Pt seated in wheelchair with rest break needed and pt performing pursed lip breathing techniques secondary to feeling of claustrophobia from bathroom. Pt donning pul over shirt and performed grooming while seated in wheelchair at sink with supervision. Call bell and all needed items within reach upon exiting the room.  Therapy Documentation Precautions:  Precautions Precautions: Fall Restrictions Weight Bearing Restrictions: No General:   Vital Signs:  Pain: Pain Assessment Pain Assessment: No/denies pain Pain Score: 0-No pain  See Function Navigator for Current Functional Status.   Therapy/Group: Individual Therapy  Gypsy Decant 01/20/2017, 12:40 PM

## 2017-01-20 NOTE — Progress Notes (Signed)
[place] PHYSICAL MEDICINE & REHABILITATION     PROGRESS NOTE    Subjective/Complaints: Up in chair finishing breakfast. Feels well  ROS: pt denies nausea, vomiting, diarrhea, cough, shortness of breath or chest pain   Objective: Vital Signs: Blood pressure (!) 123/50, pulse 76, temperature 98.5 F (36.9 C), temperature source Oral, resp. rate 16, height 5\' 5"  (1.651 m), weight 78.9 kg (174 lb), SpO2 97 %. No results found. No results for input(s): WBC, HGB, HCT, PLT in the last 72 hours.  Recent Labs  01/20/17 0542  NA 141  K 3.8  CL 108  GLUCOSE 83  BUN 13  CREATININE 0.91  CALCIUM 8.6*   CBG (last 3)  No results for input(s): GLUCAP in the last 72 hours.  Wt Readings from Last 3 Encounters:  01/20/17 78.9 kg (174 lb)  01/10/17 81.8 kg (180 lb 6.4 oz)  01/09/17 77.7 kg (171 lb 3.2 oz)    Physical Exam:  Constitutional: She is oriented to person, place, and time. She appears well-developedand well-nourished. No distress but does look a little fatigued.  HENT:  Head: Normocephalicand atraumatic.  Eyes: EOMI. No discharge.  Neck: Normal range of motion. Neck supple.  Cardiovascular: RRR without murmur. No JVD  . Respiratory: CTA Bilaterally without wheezes or rales. Normal effort     GI: BS +, non-tender, non-distended   Musculoskeletal: She exhibits trace RLE edema    Neurological: She is alertand oriented to person, place, and time.  Speech clear.  Normal expression. Intact cognition/awareness. Sensory exam normal.   RUE   5/5 deltoid, biceps, triceps, wrist, HI.   RLE: 3+/5 HF, KE,    3 to 3+/5 ADF/PF--    Skin: Skin is warmand dry. Staples in scalp in place.  Psychiatric: pleasant and cooperative.   Assessment/Plan: 1. 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 Physiatrist is providing close team  supervision and 24 hour management of active medical problems listed below. Physiatrist and rehab team continue to assess barriers to discharge/monitor patient progress toward functional and medical goals.  Function:  Bathing Bathing position   Position: Wheelchair/chair at sink  Bathing parts Body parts bathed by patient: Right arm, Left arm, Chest, Abdomen, Right lower leg, Left lower leg, Front perineal area, Left upper leg, Right upper leg Body parts bathed by helper: Back, Buttocks  Bathing assist Assist Level: Touching or steadying assistance(Pt > 75%)      Upper Body Dressing/Undressing Upper body dressing   What is the patient wearing?: Pull over shirt/dress     Pull over shirt/dress - Perfomed by patient: Thread/unthread right sleeve, Thread/unthread left sleeve, Put head through opening, Pull shirt over trunk Pull over shirt/dress - Perfomed by helper: Pull shirt over trunk        Upper body assist Assist Level: Supervision or verbal cues   Set up : To obtain clothing/put away  Lower Body Dressing/Undressing Lower body dressing   What is the patient wearing?: Non-skid slipper socks, Ted Hose Underwear - Performed by patient: Thread/unthread right underwear leg, Thread/unthread left underwear leg Underwear - Performed by helper: Pull underwear up/down Pants- Performed by patient: Thread/unthread right pants leg, Thread/unthread left pants leg Pants- Performed by helper: Pull pants up/down Non-skid slipper socks- Performed by patient: Don/doff right sock, Don/doff left sock Non-skid slipper socks- Performed by helper: Don/doff right sock, Don/doff left sock  TED Hose - Performed by helper: Don/doff right TED hose, Don/doff left TED hose  Lower body assist Assist for lower body dressing: Touching or steadying assistance (Pt > 75%)      Toileting Toileting   Toileting steps completed by patient: Adjust clothing prior to toileting, Adjust clothing  after toileting Toileting steps completed by helper: Performs perineal hygiene, Adjust clothing after toileting, Adjust clothing prior to toileting [place]: Grab bar or rail  Toileting assist Assist level: Touching or steadying assistance (Pt.75%)   Transfers Chair/bed transfer   Chair/bed transfer method: Stand pivot Chair/bed transfer assist level: Moderate assist (Pt 50 - 74%/lift or lower) Chair/bed transfer assistive device: Mechanical lift Mechanical lift: [name]   Locomotion Ambulation     Max distance: 12' Assist level: Touching or steadying assistance (Pt > 75%)   Wheelchair   Type: Manual Max wheelchair distance: 120' Assist Level: Supervision or verbal cues  Cognition Comprehension Comprehension assist level: Follows complex conversation/direction with extra time/assistive device  Expression Expression assist level: Expresses complex ideas: With extra time/assistive device  Social Interaction Social Interaction assist level: Interacts appropriately with others - No medications needed.  Problem Solving Problem solving assist level: Solves complex 90% of the time/cues < 10% of the time  Memory Memory assist level: More than reasonable amount of time   Medical Problem List and Plan: 1. Functional deficits and right hemiparesissecondary to metastatic lung cancer to the brain. -continue CIR therapies   -making progressive gains with strength and mobiltiy.  2. DVT left gastroc: Mechanical: Sequential compression devices, below knee Bilateral lower extremities, IVC filter in place.   -discussed elevation of legs when resting, compression stockings, etc  3. Headaches/Pain Management: Headaches persistent and being managed with prn hydocodone.  4. Mood: LCSW to follow for evaluation and support.  5. Neuropsych: This patient iscapable of making decisions on herown behalf. 6. Skin/Wound Care: routine pressure relief measures.  --staples  out 7. Fluids/Electrolytes/Nutrition: Monitor I/O. BUN back to normal 11  -continue to push fluids.    -D2 thins  -repleting potassium 8. Nausea: Likely central. Continue full liquid diet. Zofran not effective--continue promethizine prn.  9. H/o A Fib: Managed with diltiazem. Monitor HR bid. HR controlled 10/14 Vitals:   01/20/17 0458 01/20/17 0707  BP:    Pulse: 76   Resp: 16   Temp: 98.5 F (36.9 C)   SpO2: 100% 97%   10. Emphysema/Bronchiectasis: continue nebulizers.  chest PT bid as at home. Continue oxygen at nights and prn during the day.  11. Leucocytosis: resolved   -likely steroid related  -6.4 10/15 12. Constipation: apparent reacation to senokot-s.    -use prn miralax  -encourage fruits/fiber  -encourage PO fluids 13.  GERD-  Protonix  Increased to 40mg  bid with improvement   LOS (Days) 10 A FACE TO FACE EVALUATION WAS PERFORMED  Alger Simons T, MD 01/20/2017 10:03 AM

## 2017-01-21 ENCOUNTER — Inpatient Hospital Stay (HOSPITAL_COMMUNITY): Payer: Medicare HMO | Admitting: Occupational Therapy

## 2017-01-21 ENCOUNTER — Inpatient Hospital Stay (HOSPITAL_COMMUNITY): Payer: Medicare HMO

## 2017-01-21 DIAGNOSIS — D72829 Elevated white blood cell count, unspecified: Secondary | ICD-10-CM

## 2017-01-21 NOTE — Progress Notes (Signed)
Physical Therapy Session Note  Patient Details  Name: Tracy Bridges MRN: [id number] Date of Birth: 22-Nov-1940  Today's Date: 01/21/2017 PT Individual Time: 1000-1100 PT Individual Time Calculation (min): 60 min   Short Term Goals: Week 2:  PT Short Term Goal 1 (Week 2): = LTGS  Skilled Therapeutic Interventions/Progress Updates:   Engaged in vestibular exercises for gaze stabilization with horizontal head turns. Pt reports mild decrease in symptoms today vs yesterday. Still maintains very slow speed. Declines vertical head movements due to causing her to have a headache. UE ergometer with RUE only for strengthening and ROM x 5 min. W/c mobility training in controlled and home environment with supervision and extra time (decreased speed today). Blocked practice sit <> stands with min assist and cues for technique and hand placement. Pt with difficulty advancing RLE or weightbearing only able to take a step forward and backward but unable to progress gait today. Appears to have some mental block due to incident with [name] this morning. Discussed how this relates to mobility at home and strong recommendation for only using w/c except very short distances with RW at this time due to inconsistency. Planning for fam ed with husband this weekend and Monday for hands on training.   Therapy Documentation Precautions:  Precautions Precautions: Fall Restrictions Weight Bearing Restrictions: No  Pain:  Reports pain in R shin from hitting it on the [name] this morning.    See Function Navigator for Current Functional Status.   Therapy/Group: Individual Therapy  Tracy Bridges, PT, DPT  01/21/2017, 12:05 PM

## 2017-01-21 NOTE — Progress Notes (Signed)
[place] PHYSICAL MEDICINE & REHABILITATION     PROGRESS NOTE    Subjective/Complaints: Up in chair finishing breakfast. Feels well  ROS: pt denies nausea, vomiting, diarrhea, cough, shortness of breath or chest pain   Objective: Vital Signs: Blood pressure 111/62, pulse 87, temperature 98.3 F (36.8 C), temperature source Oral, resp. rate 14, height 5\' 5"  (1.651 m), weight 76.9 kg (169 lb 8 oz), SpO2 97 %. No results found. No results for input(s): WBC, HGB, HCT, PLT in the last 72 hours.  Recent Labs  01/20/17 0542  NA 141  K 3.8  CL 108  GLUCOSE 83  BUN 13  CREATININE 0.91  CALCIUM 8.6*   CBG (last 3)  No results for input(s): GLUCAP in the last 72 hours.  Wt Readings from Last 3 Encounters:  01/21/17 76.9 kg (169 lb 8 oz)  01/10/17 81.8 kg (180 lb 6.4 oz)  01/09/17 77.7 kg (171 lb 3.2 oz)    Physical Exam:  Constitutional: She is oriented to person, place, and time. She appears well-developedand well-nourished. No distress but does look a little fatigued.  HENT:  Head: Normocephalicand atraumatic.  Eyes: EOMI. No discharge.  Neck: Normal range of motion. Neck supple.  Cardiovascular: RRR without murmur. No JVD  . Respiratory:CTA Bilaterally without wheezes or rales. Normal effort    GI: BS +, non-tender, non-distended   Musculoskeletal: She exhibits trace RLE edema --stable Neurological: She is alertand oriented to person, place, and time.  Speech clear.  Cognitively intact Normal expression.  Sensory exam normal.   RUE   5/5 deltoid, biceps, triceps, wrist, HI.   RLE: 3+ to 4-/5 HF, KE,     3+/5 ADF/PF    Skin: Skin is warmand dry. Staples in scalp in place.  Psychiatric: pleasant and cooperative.   Assessment/Plan: 1. 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 Physiatrist is providing close team  supervision and 24 hour management of active medical problems listed below. Physiatrist and rehab team continue to assess barriers to discharge/monitor patient progress toward functional and medical goals.  Function:  Bathing Bathing position   Position: Shower  Bathing parts Body parts bathed by patient: Right arm, Left arm, Chest, Abdomen, Right lower leg, Left lower leg, Front perineal area, Left upper leg, Right upper leg Body parts bathed by helper: Buttocks  Bathing assist Assist Level: Touching or steadying assistance(Pt > 75%)      Upper Body Dressing/Undressing Upper body dressing   What is the patient wearing?: Pull over shirt/dress     Pull over shirt/dress - Perfomed by patient: Thread/unthread right sleeve, Thread/unthread left sleeve, Put head through opening, Pull shirt over trunk Pull over shirt/dress - Perfomed by helper: Pull shirt over trunk        Upper body assist Assist Level: Supervision or verbal cues, Set up   Set up : To obtain clothing/put away  Lower Body Dressing/Undressing Lower body dressing   What is the patient wearing?: Non-skid slipper socks, Ted Hose Underwear - Performed by patient: Thread/unthread right underwear leg, Thread/unthread left underwear leg Underwear - Performed by helper: Pull underwear up/down Pants- Performed by patient: Thread/unthread right pants leg, Thread/unthread left pants leg Pants- Performed by helper: Pull pants up/down Non-skid slipper socks- Performed by patient: Don/doff right sock, Don/doff left sock Non-skid slipper socks- Performed by helper: Don/doff right sock, Don/doff left sock  TED Hose - Performed by helper: Don/doff right TED hose, Don/doff left TED hose  Lower body assist Assist for lower body dressing: Touching or steadying assistance (Pt > 75%)      Toileting Toileting   Toileting steps completed by patient: Adjust clothing prior to toileting Toileting steps completed by  helper: Performs perineal hygiene, Adjust clothing after toileting Toileting Assistive Devices: Other (comment)  Toileting assist Assist level: Touching or steadying assistance (Pt.75%)   Transfers Chair/bed transfer   Chair/bed transfer method: Stand pivot Chair/bed transfer assist level: Touching or steadying assistance (Pt > 75%) Chair/bed transfer assistive device: Mechanical lift Mechanical lift: [name]   Locomotion Ambulation     Max distance: 12' Assist level: Touching or steadying assistance (Pt > 75%)   Wheelchair   Type: Manual Max wheelchair distance: 36' Assist Level: Supervision or verbal cues  Cognition Comprehension Comprehension assist level: Follows complex conversation/direction with extra time/assistive device  Expression Expression assist level: Expresses complex ideas: With extra time/assistive device  Social Interaction Social Interaction assist level: Interacts appropriately with others - No medications needed.  Problem Solving Problem solving assist level: Solves complex 90% of the time/cues < 10% of the time  Memory Memory assist level: More than reasonable amount of time   Medical Problem List and Plan: 1. Functional deficits and right hemiparesissecondary to metastatic lung cancer to the brain. -continue CIR therapies   -continues demonstrate functional progress  2. DVT left gastroc: Mechanical: Sequential compression devices, below knee Bilateral lower extremities, IVC filter in place.   -discussed elevation of legs when resting, compression stockings, etc  3. Headaches/Pain Management: Headaches persistent and being managed with prn hydocodone.  4. Mood: LCSW to follow for evaluation and support.  5. Neuropsych: This patient iscapable of making decisions on herown behalf. 6. Skin/Wound Care: routine pressure relief measures.  --staples out 7. Fluids/Electrolytes/Nutrition: Monitor I/O. BUN   normal 13  -continue to push fluids.     -D2 thins  -potassium 3.8 on 10/18---continue supplement 8. Nausea: Likely central. Continue full liquid diet. Zofran not effective--continue promethizine prn.  9. H/o A Fib: Managed with diltiazem. Monitor HR bid. HR controlled 10/19 Vitals:   01/20/17 1647 01/20/17 2032  BP: 111/62   Pulse: 91 87  Resp: 17 14  Temp: 98.3 F (36.8 C)   SpO2: 98% 97%   10. Emphysema/Bronchiectasis: continue nebulizers.  chest PT bid as at home. Continue oxygen at nights and prn during the day.  11. Leucocytosis: resolved   -likely steroid related  -6.4 10/15 12. Constipation: apparent reacation to senokot-s.    -use prn miralax  -encourage fruits/fiber  -encourage PO fluids 13.  GERD-  Protonix  Increased to 40mg  bid with improvement   LOS (Days) 11 A FACE TO FACE EVALUATION WAS PERFORMED  Meredith Staggers, MD 01/21/2017 9:29 AM

## 2017-01-21 NOTE — Plan of Care (Signed)
Problem: RH Bed Mobility Goal: LTG Patient will perform bed mobility with assist (PT) LTG: Patient will perform bed mobility with assistance, with/without cues (PT).  D/c due to patient to sleep in recliner at home due to height of her bed. ABG

## 2017-01-21 NOTE — Progress Notes (Signed)
Occupational Therapy Session Note  Patient Details  Name: Tracy Bridges MRN: [id number] Date of Birth: 1940-04-20  58 min   Short Term Goals: Week 2:  OT Short Term Goal 1 (Week 2): STGs=LTGs secondary to upcoming discharge  Skilled Therapeutic Interventions/Progress Updates:    1:1. Pt requesting tofocus on [facility] activities today d/t pain in RLE (RN aware). Pt reporting increased difficulty gripping w/c. Pt completes 8 min UE ergometer while OT applies coband to R w/c wheel as pt allergic to rubber theraband. Pt manipulates coins palm<>finger translocation with Vc for stabilization with pinky and decreasing compensatory movements dropping coins 25% of time. Pt practices shift with RUE by dealing out playing cards with supervision to play game of slapjack to work on attention, R arm reaction time and UE ROM. Pt propels w/c part way back to room reporting improvement with gripping wheel with coban. exited session with pt seated in w/c iwht call light in reach and all needs met.    Therapy Documentation Precautions:  Precautions Precautions: Fall Restrictions Weight Bearing Restrictions: No General:    See Function Navigator for Current Functional Status.   Therapy/Group: Individual Therapy  Tonny Branch 01/21/2017, 11:57 AM

## 2017-01-21 NOTE — Progress Notes (Signed)
Occupational Therapy Session Note  Patient Details  Name: Tracy Bridges MRN: [id number] Date of Birth: [birth year]  Today's Date: 01/21/2017 OT Individual Time: [phone] OT Individual Time Calculation (min): 74 min    Short Term Goals: Week 2:  OT Short Term Goal 1 (Week 2): STGs=LTGs secondary to upcoming discharge  Skilled Therapeutic Interventions/Progress Updates:    Tx focus on ADL retraining, balance, and activity tolerance during self care tasks.   Pt greeted EOB, waiting for breakfast. When breakfast arrived, pt opting to wait until tx was over due to need to void. Sit<stand from elevated bed with Max A to [place]. Pt transferred to toilet, able to manage clothing and assist with frontal pericare with extra time and min guard for balance while standing in [name]. Cream applied to buttocks for skin integrity and perihygiene completed for thoroughness. Pt donning LB clothing while seated on elevated toilet, able to achieve figure 4 with R LE only by pulling on gripper sock due to LE weakness. Pt requiring cues to take multiple rest breaks due to audible shallow breathing. Teds donned with Total A. Max A sit<stand in [name] for elevating LB garments over hips. She was able to complete 75% of this, only needed assist for the Rt side. Pt washing face and hands w/c level at sink afterwards, then donned clean shirt with supervision. Ice applied to R LE due to it slipping from [name] platform after 1 stand<sit and shin bumping on knee block pad. No skin tears and RN made aware. Per pt, it was just sore. Pt left in w/c with all needs within reach and RT present.     Therapy Documentation Precautions:  Precautions Precautions: Fall Restrictions Weight Bearing Restrictions: No General:   Vital Signs: Oxygen Therapy O2 Device: Not Delivered Pain: c/o R LE pain after bumping it on [name], pain subsided as tx progressed. Ice applied prior to session exit      See Function Navigator for Current  Functional Status.   Therapy/Group: Individual Therapy  [name] A Kerby Hockley 01/21/2017, 12:14 PM

## 2017-01-22 ENCOUNTER — Inpatient Hospital Stay (HOSPITAL_COMMUNITY): Payer: Medicare HMO | Admitting: Occupational Therapy

## 2017-01-22 ENCOUNTER — Inpatient Hospital Stay (HOSPITAL_COMMUNITY): Payer: Medicare HMO

## 2017-01-22 ENCOUNTER — Inpatient Hospital Stay (HOSPITAL_COMMUNITY): Payer: Medicare HMO | Admitting: Physical Therapy

## 2017-01-22 DIAGNOSIS — D62 Acute posthemorrhagic anemia: Secondary | ICD-10-CM

## 2017-01-22 DIAGNOSIS — M79671 Pain in right foot: Secondary | ICD-10-CM

## 2017-01-22 DIAGNOSIS — D696 Thrombocytopenia, unspecified: Secondary | ICD-10-CM

## 2017-01-22 DIAGNOSIS — E876 Hypokalemia: Secondary | ICD-10-CM

## 2017-01-22 MED ORDER — METHOCARBAMOL 500 MG PO TABS: 500.0000 mg | ORAL_TABLET | Freq: Three times a day (TID) | ORAL | Status: DC | PRN

## 2017-01-22 NOTE — Progress Notes (Signed)
Occupational Therapy Session Note  Patient Details  Name: Tracy Bridges MRN: [id number] Date of Birth: 1941-01-21  Today's Date: 01/22/2017 OT Individual Time: 8502-7741 OT Individual Time Calculation (min): 28 min    Short Term Goals: Week 1:  OT Short Term Goal 1 (Week 1): Pt will complete bathing at sit<stand level with Mod A OT Short Term Goal 1 - Progress (Week 1): Progressing toward goal OT Short Term Goal 2 (Week 1): Pt will complete toileting tasks with 1 helper OT Short Term Goal 2 - Progress (Week 1): Met OT Short Term Goal 3 (Week 1): Pt will complete toilet transfer with mod A and LRAD OT Short Term Goal 3 - Progress (Week 1): Met Week 2:  OT Short Term Goal 1 (Week 2): STGs=LTGs secondary to upcoming discharge  Skilled Therapeutic Interventions/Progress Updates:    Tx focus on sit<stands, activity tolerance, and Rt NMR during self care tasks.   Pt greeted supine in bed, reported not feeling well and needing to void. Pt transitioning, with Mod-Max A to EOB with more assist required for moving R LE due to edema/pain. Sit<stand in [place] with Max A and pt was escorted to elevated toilet. Pt requiring Total A for toileting tasks due to fatigue. She then transferred to recliner. Breathing heavily at this time. BP 134/59, HR 102BPM. RN made aware. Pt repositioned in recliner for comfort and left with all needs within reach.   Pt with noticeable regresses with R UE use, unable to open packages and elevate R UE >90 degrees without assist today. RN made aware.   Therapy Documentation Precautions:  Precautions Precautions: Fall Restrictions Weight Bearing Restrictions: No  Vital Signs: Therapy Vitals Temp: 98.2 F (36.8 C) Temp Source: Oral Pulse Rate: (!) 102 Resp: 16 BP: (!) 114/48 Patient Position (if appropriate): Sitting Oxygen Therapy SpO2: 97 % O2 Device: Not Delivered Pain: No c/o pain during tx    ADL:      See Function Navigator for Current Functional  Status.   Therapy/Group: Individual Therapy  Junelle Hashemi A Akila Batta 01/22/2017, 2:53 PM

## 2017-01-22 NOTE — Progress Notes (Signed)
[place] PHYSICAL MEDICINE & REHABILITATION     PROGRESS NOTE    Subjective/Complaints: Patient seen sitting up at the edge of the bed this morning. She states she slept well overnight. She mentions that her right foot got caught yesterday, but denies pain. Later informed about edema and right lower extremity.  ROS: Denies nausea, vomiting, diarrhea, shortness of breath or chest pain   Objective: Vital Signs: Blood pressure 127/89, pulse (!) 102, temperature 98.2 F (36.8 C), temperature source Oral, resp. rate 16, height 5\' 5"  (1.651 m), weight 77.7 kg (171 lb 4.8 oz), SpO2 97 %. Dg Foot Complete Right  Result Date: 01/22/2017 CLINICAL DATA:  Injury EXAM: RIGHT FOOT COMPLETE - 3+ VIEW COMPARISON:  None. FINDINGS: Marked hallux valgus deformity.  Negative for fracture or erosion. IMPRESSION: No acute abnormality. Electronically Signed   By: Franchot Gallo M.D.   On: 01/22/2017 13:59   No results for input(s): WBC, HGB, HCT, PLT in the last 72 hours.  Recent Labs  01/20/17 0542  NA 141  K 3.8  CL 108  GLUCOSE 83  BUN 13  CREATININE 0.91  CALCIUM 8.6*   CBG (last 3)  No results for input(s): GLUCAP in the last 72 hours.  Wt Readings from Last 3 Encounters:  01/22/17 77.7 kg (171 lb 4.8 oz)  01/10/17 81.8 kg (180 lb 6.4 oz)  01/09/17 77.7 kg (171 lb 3.2 oz)    Physical Exam:  Constitutional: She appears well-developedand well-nourished. NAD.  HENT: Normocephalicand atraumatic.  Eyes: EOMI. No discharge.  Cardiovascular: RRR. No JVD  . Respiratory:CTA Bilaterally. Normal effort    GI: BS +, non-distended   Musculoskeletal: She exhibits trace RLE edema --stable Neurological: She is alertand oriented Speech clear.   RUE   5/5 deltoid, biceps, triceps, wrist, HI.   RLE: 3+/5 HF, KE, 3+/5 ADF/PF    Skin: Skin is warmand dry. Staples in scalp in place.  Psychiatric: pleasant and cooperative.   Assessment/Plan: 1. Functional deficits and right hemiparesis  secondary to metastatic lung cancer to the brain which require 3+ hours per day of interdisciplinary therapy in a comprehensive inpatient rehab setting. Physiatrist is providing close team supervision and 24 hour management of active medical problems listed below. Physiatrist and rehab team continue to assess barriers to discharge/monitor patient progress toward functional and medical goals.  Function:  Bathing Bathing position   Position: Shower  Bathing parts Body parts bathed by patient: Right arm, Left arm, Chest, Abdomen, Right lower leg, Left lower leg, Front perineal area, Left upper leg, Right upper leg Body parts bathed by helper: Buttocks  Bathing assist Assist Level: Touching or steadying assistance(Pt > 75%)      Upper Body Dressing/Undressing Upper body dressing   What is the patient wearing?: Pull over shirt/dress     Pull over shirt/dress - Perfomed by patient: Thread/unthread right sleeve, Thread/unthread left sleeve, Put head through opening, Pull shirt over trunk Pull over shirt/dress - Perfomed by helper: Pull shirt over trunk        Upper body assist Assist Level: Supervision or verbal cues   Set up : To obtain clothing/put away  Lower Body Dressing/Undressing Lower body dressing   What is the patient wearing?: Non-skid slipper socks, Ted Hose, Underwear, Pants Underwear - Performed by patient: Thread/unthread right underwear leg, Thread/unthread left underwear leg (on Rt side) Underwear - Performed by helper: Pull underwear up/down Pants- Performed by patient: Thread/unthread right pants leg, Thread/unthread left pants leg Pants- Performed by helper:  Pull pants up/down (on Rt side ) Non-skid slipper socks- Performed by patient: Don/doff right sock, Don/doff left sock Non-skid slipper socks- Performed by helper: Don/doff right sock, Don/doff left sock               TED Hose - Performed by helper: Don/doff right TED hose, Don/doff left TED hose  Lower  body assist Assist for lower body dressing:  (Max A sit<stand in [place])      Toileting Toileting   Toileting steps completed by patient: Adjust clothing prior to toileting, Adjust clothing after toileting Toileting steps completed by helper: Adjust clothing prior to toileting, Performs perineal hygiene, Adjust clothing after toileting Toileting Assistive Devices: Other (comment)  Toileting assist Assist level: Two helpers   Transfers Chair/bed transfer   Chair/bed transfer method: Stand pivot Chair/bed transfer assist level: Moderate assist (Pt 50 - 74%/lift or lower) Chair/bed transfer assistive device: Mechanical lift Mechanical lift: [name]   Locomotion Ambulation     Max distance: 12' Assist level: Touching or steadying assistance (Pt > 75%)   Wheelchair   Type: Manual Max wheelchair distance: 5ft Assist Level: Touching or steadying assistance (Pt > 75%)  Cognition Comprehension Comprehension assist level: Follows complex conversation/direction with extra time/assistive device  Expression Expression assist level: Expresses complex ideas: With extra time/assistive device  Social Interaction Social Interaction assist level: Interacts appropriately with others - No medications needed.  Problem Solving Problem solving assist level: Solves complex 90% of the time/cues < 10% of the time  Memory Memory assist level: More than reasonable amount of time   Medical Problem List and Plan: 1. Functional deficits and right hemiparesissecondary to metastatic lung cancer to the brain.  continue CIR   Notes reviewed. Images reviewed. 2. DVT left gastroc: Mechanical: Sequential compression devices, below knee Bilateral lower extremities, IVC filter in place.   -Has had discussions regarding elevation of legs when resting, compression stockings, etc  3. Headaches/Pain Management: Headaches persistent and being managed with prn hydocodone.  4. Mood: LCSW to follow for evaluation and  support.  5. Neuropsych: This patient iscapable of making decisions on herown behalf. 6. Skin/Wound Care: routine pressure relief measures. 7. Fluids/Electrolytes/Nutrition: Monitor I/Os  -continue to push fluids.    -D2 thins 8. Nausea: Likely central. Continue full liquid diet. Zofran not effective--continue promethizine prn.  9. H/o A Fib: Managed with diltiazem. Monitor HR bid. HR controlled 10/20 Vitals:   01/22/17 2023 01/22/17 2030  BP:  127/89  Pulse:    Resp:    Temp:    SpO2: 97%    10. Emphysema/Bronchiectasis: continue nebulizers.  chest PT bid as at home. Continue oxygen at nights and prn during the day.  11. Leucocytosis: resolved   -likely steroid related 12. Constipation: apparent reacation to senokot-s.    -use prn miralax  -encourage fruits/fiber  -encourage PO fluids 13.  GERD-  Protonix  Increased to 40mg  bid with improvement 14. Hypokalemia  Potassium 2.8 on 10/18  Continue supplementation 15. Acute blood loss anemia  Hemoglobin 10.0 on 10/15  Labs ordered for Monday 16. Thrombocytopenia  Platelets 88 on 10/15  Labs ordered for Monday 17. Traumatic right foot pain:  Likely sprain/strain  X-ray ordered  Ice/wraps  Methocarbamol when necessary added on 10/20  LOS (Days) 12 A FACE TO FACE EVALUATION WAS PERFORMED  Kazzandra Desaulniers [name], MD 01/22/2017 9:53 PM

## 2017-01-22 NOTE — Progress Notes (Signed)
Physical Therapy Session Note  Patient Details  Name: Tracy Bridges MRN: [id number] Date of Birth: January 20, 1941  Today's Date: 01/22/2017 PT Individual Time: 0934-1000 PT Individual Time Calculation (min): 26 min   Short Term Goals: Week 2:  PT Short Term Goal 1 (Week 2): = LTGS  Skilled Therapeutic Interventions/Progress Updates:   Pt received sitting in WC and agreeable to PT  Pt reports that her R leg feels "more numb" and swollen this AM following incident in [name] lift yesterday. PT notified RN, who has already spoken with MD. She reports no activity restrictions at this time.   WC mobility training x 2f with min assist from PT and moderate cues for increased R shoulder ROM as well as proper postural alignment.   Pt returned to room and performed stand pivot transfer to bed with mod assist. Sit>supine completed with max assist due to pain in the RLE. Pt left supine in bed with call bell in reach and all needs met.  Prior to stand pivot transfer, she reports that she cannot extend the RLE, but noted to use RLE without increased assist for transfer. Following transfer, pt reports that she still cannot use RLE to assist to sit>supine.      Therapy Documentation Precautions:  Precautions Precautions: Fall Restrictions Weight Bearing Restrictions: No Pain 9/10 in the RLE with WB.  Vital Signs: Oxygen Therapy SpO2: 96 % O2 Device: Not Delivered   See Function Navigator for Current Functional Status.   Therapy/Group: Individual Therapy  ALorie Phenix10/20/2018, 10:05 AM

## 2017-01-23 ENCOUNTER — Inpatient Hospital Stay (HOSPITAL_COMMUNITY): Payer: Medicare HMO | Admitting: Occupational Therapy

## 2017-01-23 ENCOUNTER — Inpatient Hospital Stay (HOSPITAL_COMMUNITY): Payer: Medicare HMO

## 2017-01-23 NOTE — Progress Notes (Addendum)
Patient requested to sit in  Wheelchair but refuses to wear pink safety belt. Educated patient. Made multiple attempts to get patient to wear it , but patient continues to refuse. Patient's reason was it makes her feel claustrophobic Will continue to monitor

## 2017-01-23 NOTE — Progress Notes (Signed)
[place] PHYSICAL MEDICINE & REHABILITATION     PROGRESS NOTE    Subjective/Complaints: Pt seen this AM sitting up in her chair.  She slept well overnight.  She states she feels slightly better this AM.    ROS: Denies nausea, vomiting, diarrhea, shortness of breath or chest pain.  Objective: Vital Signs: Blood pressure (!) 130/47, pulse 88, temperature 98 F (36.7 C), temperature source Oral, resp. rate 20, height 5\' 5"  (1.651 m), weight 77.7 kg (171 lb 4.8 oz), SpO2 (!) 9 %. Dg Foot Complete Right  Result Date: 01/22/2017 CLINICAL DATA:  Injury EXAM: RIGHT FOOT COMPLETE - 3+ VIEW COMPARISON:  None. FINDINGS: Marked hallux valgus deformity.  Negative for fracture or erosion. IMPRESSION: No acute abnormality. Electronically Signed   By: Franchot Gallo M.D.   On: 01/22/2017 13:59   No results for input(s): WBC, HGB, HCT, PLT in the last 72 hours. No results for input(s): NA, K, CL, GLUCOSE, BUN, CREATININE, CALCIUM in the last 72 hours.  Invalid input(s): CO CBG (last 3)  No results for input(s): GLUCAP in the last 72 hours.  Wt Readings from Last 3 Encounters:  01/22/17 77.7 kg (171 lb 4.8 oz)  01/10/17 81.8 kg (180 lb 6.4 oz)  01/09/17 77.7 kg (171 lb 3.2 oz)    Physical Exam:  Constitutional: She appears well-developedand well-nourished. NAD.  HENT: Normocephalicand atraumatic.  Eyes: EOMI. No discharge.  Cardiovascular: RRR. No JVD  . Respiratory:CTA Bilaterally. Normal effort    GI: BS +, non-distended   Musculoskeletal: She exhibits trace RLE edema Neurological: She is alertand oriented Speech clear.   RUE:  4-/5 proximal to distal.   RLE: 3/5 HF, KE, 3/5 ADF/PF    Skin: Skin is warmand dry. Staples in scalp in place.  Psychiatric: pleasant and cooperative.   Assessment/Plan: 1. Functional deficits and right hemiparesis secondary to metastatic lung cancer to the brain which require 3+ hours per day of interdisciplinary therapy in a comprehensive inpatient  rehab setting. Physiatrist is providing close team supervision and 24 hour management of active medical problems listed below. Physiatrist and rehab team continue to assess barriers to discharge/monitor patient progress toward functional and medical goals.  Function:  Bathing Bathing position   Position: Shower  Bathing parts Body parts bathed by patient: Right arm, Left arm, Chest, Abdomen, Right lower leg, Left lower leg, Front perineal area, Left upper leg, Right upper leg Body parts bathed by helper: Buttocks  Bathing assist Assist Level: Touching or steadying assistance(Pt > 75%)      Upper Body Dressing/Undressing Upper body dressing   What is the patient wearing?: Pull over shirt/dress     Pull over shirt/dress - Perfomed by patient: Thread/unthread right sleeve, Thread/unthread left sleeve, Put head through opening, Pull shirt over trunk Pull over shirt/dress - Perfomed by helper: Pull shirt over trunk        Upper body assist Assist Level: Supervision or verbal cues   Set up : To obtain clothing/put away  Lower Body Dressing/Undressing Lower body dressing   What is the patient wearing?: Non-skid slipper socks, Ted Hose, Underwear, Pants Underwear - Performed by patient: Thread/unthread right underwear leg, Thread/unthread left underwear leg (on Rt side) Underwear - Performed by helper: Pull underwear up/down Pants- Performed by patient: Thread/unthread right pants leg, Thread/unthread left pants leg Pants- Performed by helper: Pull pants up/down (on Rt side ) Non-skid slipper socks- Performed by patient: Don/doff right sock, Don/doff left sock Non-skid slipper socks- Performed by helper: Don/doff  right sock, Don/doff left sock               TED Hose - Performed by helper: Don/doff right TED hose, Don/doff left TED hose  Lower body assist Assist for lower body dressing:  (Max A sit<stand in [place])      Toileting Toileting   Toileting steps completed by  patient: Adjust clothing prior to toileting, Adjust clothing after toileting Toileting steps completed by helper: Adjust clothing prior to toileting, Performs perineal hygiene, Adjust clothing after toileting Toileting Assistive Devices: Other (comment)  Toileting assist Assist level: Two helpers   Transfers Chair/bed transfer   Chair/bed transfer method: Stand pivot Chair/bed transfer assist level: Moderate assist (Pt 50 - 74%/lift or lower) Chair/bed transfer assistive device: Mechanical lift Mechanical lift: [name]   Locomotion Ambulation     Max distance: 12' Assist level: Touching or steadying assistance (Pt > 75%)   Wheelchair   Type: Manual Max wheelchair distance: 68ft Assist Level: Touching or steadying assistance (Pt > 75%)  Cognition Comprehension Comprehension assist level: Follows complex conversation/direction with extra time/assistive device  Expression Expression assist level: Expresses complex ideas: With extra time/assistive device  Social Interaction Social Interaction assist level: Interacts appropriately with others - No medications needed.  Problem Solving Problem solving assist level: Solves complex 90% of the time/cues < 10% of the time  Memory Memory assist level: More than reasonable amount of time   Medical Problem List and Plan: 1. Functional deficits and right hemiparesissecondary to metastatic lung cancer to the brain.  continue CIR  CT head ordered 2. DVT left gastroc: Mechanical: Sequential compression devices, below knee Bilateral lower extremities, IVC filter in place.   -Has had discussions regarding elevation of legs when resting, compression stockings, etc  3. Headaches/Pain Management: Headaches persistent and being managed with prn hydocodone.  4. Mood: LCSW to follow for evaluation and support.  5. Neuropsych: This patient iscapable of making decisions on herown behalf. 6. Skin/Wound Care: routine pressure relief measures. 7.  Fluids/Electrolytes/Nutrition: Monitor I/Os  -continue to push fluids.    -D2 thins 8. Nausea: Likely central. Continue full liquid diet. Zofran not effective--continue promethizine prn.  9. H/o A Fib: Managed with diltiazem. Monitor HR bid. HR controlled 10/21 Vitals:   01/23/17 2012 01/23/17 2023  BP: (!) 130/47   Pulse:    Resp:    Temp:    SpO2:  (!) 9%   10. Emphysema/Bronchiectasis: continue nebulizers.  chest PT bid as at home. Continue oxygen at nights and prn during the day.  11. Leucocytosis: resolved   -likely steroid related 12. Constipation: apparent reacation to senokot-s.    -use prn miralax  -encourage fruits/fiber  -encourage PO fluids 13. GERD-  Protonix  Increased to 40mg  bid with improvement 14. Hypokalemia  Potassium 3.8 on 10/18  Continue supplementation 15. Acute blood loss anemia  Hemoglobin 10.0 on 10/15  Labs ordered for tomorrow 16. Thrombocytopenia  Platelets 88 on 10/15  Labs ordered for tomorrow 17. Traumatic right foot pain:  Likely sprain/strain  X-ray reviewed, unremarkable for acute process  Ice/wraps  Methocarbamol when necessary added on 10/20  Pain appears to be improving  LOS (Days) 13 A FACE TO FACE EVALUATION WAS PERFORMED  Xzayvier Fagin [name], MD 01/23/2017 8:48 PM

## 2017-01-23 NOTE — Progress Notes (Signed)
Late entry:  Dr. Posey Pronto made aware of pt's. increased right sided weakness, RUE>RLE. Neuro checks at baseline,VSS. Increased fatigue. Monitor.

## 2017-01-24 ENCOUNTER — Inpatient Hospital Stay (HOSPITAL_COMMUNITY): Payer: Medicare HMO | Admitting: Occupational Therapy

## 2017-01-24 ENCOUNTER — Ambulatory Visit (HOSPITAL_COMMUNITY): Payer: Medicare HMO

## 2017-01-24 LAB — CBC WITH DIFFERENTIAL/PLATELET
Basophils Absolute: 0 10*3/uL (ref 0.0–0.1)
Basophils Relative: 0 %
EOS ABS: 0 10*3/uL (ref 0.0–0.7)
EOS PCT: 0 %
HCT: 36 % (ref 36.0–46.0)
HEMOGLOBIN: 11.7 g/dL — AB (ref 12.0–15.0)
LYMPHS PCT: 7 %
Lymphs Abs: 0.4 10*3/uL — ABNORMAL LOW (ref 0.7–4.0)
MCH: 30.4 pg (ref 26.0–34.0)
MCHC: 32.5 g/dL (ref 30.0–36.0)
MCV: 93.5 fL (ref 78.0–100.0)
MONOS PCT: 0 %
Monocytes Absolute: 0 10*3/uL — ABNORMAL LOW (ref 0.1–1.0)
Neutro Abs: 4.7 10*3/uL (ref 1.7–7.7)
Neutrophils Relative %: 93 %
PLATELETS: 249 10*3/uL (ref 150–400)
RBC: 3.85 MIL/uL — ABNORMAL LOW (ref 3.87–5.11)
RDW: 16.4 % — ABNORMAL HIGH (ref 11.5–15.5)
WBC: 5.1 10*3/uL (ref 4.0–10.5)

## 2017-01-24 LAB — BASIC METABOLIC PANEL
Anion gap: 10 (ref 5–15)
BUN: 19 mg/dL (ref 6–20)
CHLORIDE: 101 mmol/L (ref 101–111)
CO2: 27 mmol/L (ref 22–32)
CREATININE: 1 mg/dL (ref 0.44–1.00)
Calcium: 9.1 mg/dL (ref 8.9–10.3)
GFR calc Af Amer: >60 mL/min (ref >60)
GFR calc non Af Amer: 53 mL/min — ABNORMAL LOW (ref >60)
Glucose, Bld: 200 mg/dL — ABNORMAL HIGH (ref 65–99)
Potassium: 4.3 mmol/L (ref 3.5–5.1)
Sodium: 138 mmol/L (ref 135–145)

## 2017-01-24 MED ORDER — PREDNISONE 5 MG PO TABS
10.0000 mg | ORAL_TABLET | Freq: Once | ORAL | Status: AC
  Administered 2017-01-24: 10 mg via ORAL
  Filled 2017-01-24: qty 2

## 2017-01-24 MED ORDER — DEXAMETHASONE SODIUM PHOSPHATE 10 MG/ML IJ SOLN
8.0000 mg | Freq: Four times a day (QID) | INTRAMUSCULAR | Status: DC
  Administered 2017-01-24 – 2017-01-28 (×17): 8 mg via INTRAVENOUS
  Filled 2017-01-24 (×19): qty 0.8

## 2017-01-24 MED ORDER — DEXAMETHASONE SODIUM PHOSPHATE 10 MG/ML IJ SOLN
8.0000 mg | Freq: Four times a day (QID) | INTRAMUSCULAR | Status: DC
  Administered 2017-01-24: 8 mg via INTRAVENOUS
  Filled 2017-01-24 (×2): qty 0.8

## 2017-01-24 MED ORDER — SODIUM CHLORIDE 0.9% FLUSH
10.0000 mL | INTRAVENOUS | Status: DC | PRN
  Administered 2017-01-25 (×2): 10 mL
  Filled 2017-01-24 (×2): qty 40

## 2017-01-24 NOTE — Progress Notes (Signed)
Physical Therapy Session Note  Patient Details  Name: Tracy Bridges MRN: [id number] Date of Birth: 1940-05-12  Today's Date: 01/24/2017 PT Individual Time: 1005-1030 PT Individual Time Calculation (min): 25 min   Short Term Goals: Week 2:  PT Short Term Goal 1 (Week 2): = LTGS  Skilled Therapeutic Interventions/Progress Updates:    Attempted to see patient to engaged in functional transfer training via different technique in light of increasing R sided weakness (RUE and RLE) as pt in agreement and wanting to try something. Husband present at bedside and reports pt unable to get any rest last night and has been in and out of sleep. Pt very lethargic during session and unable to complete transfer despite max assist. Pt ultimately declining and request to just stay in recliner to rest. Positioned RUE and RLE with pillows for edema control and support of R hemi UE. Pt reporting increased difficulty with word finding. Significantly decreased active movement in RUE noted - -attempted to use during repositioning and scooting in the recliner, but pt unable to utilize functionally. No active movement noted in RLE during this session. RN aware and recommending use of lift for transfer at this time due to medical status for safety.   Therapy Documentation Precautions:  Precautions Precautions: Fall Restrictions Weight Bearing Restrictions: No General: PT Amount of Missed Time (min): 35 Minutes PT Missed Treatment Reason: Patient fatigue;Other (Comment) Pain:  Denies pain but pt with significant lethargy.   See Function Navigator for Current Functional Status.   Therapy/Group: Individual Therapy  [name], PT, DPT  01/24/2017, 10:41 AM

## 2017-01-24 NOTE — Progress Notes (Signed)
[facility] PHYSICAL MEDICINE & REHABILITATION     PROGRESS NOTE    Subjective/Complaints: Pt in chair with therapy. Increased right sided weakness and problems speaking now  ROS: pt denies nausea, vomiting, diarrhea, cough, shortness of breath or chest pain   Objective: Vital Signs: Blood pressure (!) 140/54, pulse 79, temperature 98.1 F (36.7 C), temperature source Oral, resp. rate 20, height 5\' 5"  (1.651 m), weight 75.7 kg (166 lb 14.4 oz), SpO2 94 %. Ct Head Wo Contrast  Result Date: 01/23/2017 CLINICAL DATA:  Initial evaluation for generalized weakness. History of metastatic lung cancer. EXAM: CT HEAD WITHOUT CONTRAST TECHNIQUE: Contiguous axial images were obtained from the base of the skull through the vertex without intravenous contrast. COMPARISON:  Prior MRI from 12/30/2016. FINDINGS: Brain: Postoperative changes from prior left parietal craniotomy for tumor excision. Residual tumor or at the left frontal parietal junction is present, measuring approximately 2.9 cm (series 3, image 24). Exact measurements difficult on this noncontrast examination. Associated vasogenic edema with regional mass effect is increased relative to previous MRI. No significant midline shift. No hydrocephalus or ventricular trapping. No other appreciable metastatic lesions. No acute intracranial hemorrhage. No evidence for acute large vessel territory infarct. No extra-axial fluid collection. Vascular: No hyperdense vessel. Scattered vascular calcifications noted within the carotid siphons. Skull: Sequelae of prior craniotomy at the left parietal calvarium. Overlying skin staples noted. Sinuses/Orbits: Globes and orbital soft tissues within normal limits. Paranasal sinuses are largely clear. No mastoid effusion. Other: None. IMPRESSION: 1. Sequelae of prior left parietal craniotomy for tumor resection. Suspected residual tumor at the left frontoparietal junction with worsened localized vasogenic edema as compared  to previous MRI. Associated regional mass effect without midline shift. 2. No other acute intracranial process identified. Electronically Signed   By: Jeannine Boga M.D.   On: 01/23/2017 23:50   Dg Foot Complete Right  Result Date: 01/22/2017 CLINICAL DATA:  Injury EXAM: RIGHT FOOT COMPLETE - 3+ VIEW COMPARISON:  None. FINDINGS: Marked hallux valgus deformity.  Negative for fracture or erosion. IMPRESSION: No acute abnormality. Electronically Signed   By: Franchot Gallo M.D.   On: 01/22/2017 13:59   No results for input(s): WBC, HGB, HCT, PLT in the last 72 hours. No results for input(s): NA, K, CL, GLUCOSE, BUN, CREATININE, CALCIUM in the last 72 hours.  Invalid input(s): CO CBG (last 3)  No results for input(s): GLUCAP in the last 72 hours.  Wt Readings from Last 3 Encounters:  01/24/17 75.7 kg (166 lb 14.4 oz)  01/10/17 81.8 kg (180 lb 6.4 oz)  01/09/17 77.7 kg (171 lb 3.2 oz)    Physical Exam:  Constitutional: She is oriented to person, place, and time. She appears well-developedand well-nourished. No distress but does look a little fatigued.  HENT:  Head: Normocephalicand atraumatic.  Eyes: EOMI. No discharge.  Neck: Normal range of motion. Neck supple.  Cardiovascular: RRR without murmur. No JVD  . Respiratory:CTA Bilaterally without wheezes or rales. Normal effort    GI: BS +, non-tender, non-distended   Musculoskeletal: She exhibits trace RLE edema --stable Neurological: She is alertand oriented to person, place, and time.  Speech delayed/word finding deficits now, aphasic Decreased LT right leg.    RUE   1-2/5 deltoid, biceps, triceps, wrist, HI.   RLE: 0-tr/5 prox to distal.    Skin: Skin is warmand dry.   Psychiatric: flat, discouraged.   Assessment/Plan: 1. 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 Physiatrist is providing close team supervision and 24 hour management of active medical problems listed below. Physiatrist and rehab team continue to assess barriers to discharge/monitor patient progress toward functional and medical goals.  Function:  Bathing Bathing position   Position: Shower  Bathing parts Body parts bathed by patient: Right arm, Left arm, Chest, Abdomen, Right lower leg, Left lower leg, Front perineal area, Left upper leg, Right upper leg Body parts bathed by helper: Buttocks  Bathing assist Assist Level: Touching or steadying assistance(Pt > 75%)      Upper Body Dressing/Undressing Upper body dressing   What is the patient wearing?: Pull over shirt/dress     Pull over shirt/dress - Perfomed by patient: Thread/unthread right sleeve, Thread/unthread left sleeve, Put head through opening, Pull shirt over trunk Pull over shirt/dress - Perfomed by helper: Pull shirt over trunk        Upper body assist Assist Level: Supervision or verbal cues   Set up : To obtain clothing/put away  Lower Body Dressing/Undressing Lower body dressing   What is the patient wearing?: Non-skid slipper socks, Ted Hose, Underwear, Pants Underwear - Performed by patient: Thread/unthread right underwear leg, Thread/unthread left underwear leg (on Rt side) Underwear - Performed by helper: Pull underwear up/down Pants- Performed by patient: Thread/unthread right pants leg, Thread/unthread left pants leg Pants- Performed by helper: Pull pants up/down (on Rt side ) Non-skid slipper socks- Performed by patient: Don/doff right sock, Don/doff left sock Non-skid slipper socks- Performed by helper: Don/doff right sock, Don/doff left sock               TED Hose - Performed by helper: Don/doff right TED hose, Don/doff left TED hose  Lower body assist Assist for lower body dressing:  (Max A sit<stand in [place])      Toileting Toileting   Toileting steps completed by patient:  Adjust clothing prior to toileting, Adjust clothing after toileting Toileting steps completed by helper: Adjust clothing prior to toileting, Performs perineal hygiene, Adjust clothing after toileting Toileting Assistive Devices: Other (comment)  Toileting assist Assist level: Two helpers   Transfers Chair/bed transfer   Chair/bed transfer method: Stand pivot Chair/bed transfer assist level: Moderate assist (Pt 50 - 74%/lift or lower) Chair/bed transfer assistive device: Mechanical lift Mechanical lift: [name]   Locomotion Ambulation     Max distance: 12' Assist level: Touching or steadying assistance (Pt > 75%)   Wheelchair   Type: Manual Max wheelchair distance: 28ft Assist Level: Touching or steadying assistance (Pt > 75%)  Cognition Comprehension Comprehension assist level: Follows complex conversation/direction with extra time/assistive device  Expression Expression assist level: Expresses complex ideas: With extra time/assistive device  Social Interaction Social Interaction assist level: Interacts appropriately with others - No medications needed.  Problem Solving Problem solving assist level: Solves complex 90% of the time/cues < 10% of the time  Memory Memory assist level: More than reasonable amount of time   Medical Problem List and Plan: 1. Functional deficits and right hemiparesissecondary to metastatic lung cancer to the brain. -CT reviewed :increased edema at site of residual tumor, left-fronto-parietal area, substantial worsening Right HP, aphasic       -initiated decadron iv 8mg  q6     -contacted and spoke with Dr. Cyndy Freeze who will see patient      -discussed with patient and husband re: plan            -may continue  with therapies as tolerated.  2. DVT left gastroc: Mechanical: Sequential compression devices, below knee Bilateral lower extremities, IVC filter in place.   -discussed elevation of legs when resting, compression stockings, etc  3.  Headaches/Pain Management: Headaches persistent and being managed with prn hydocodone.  4. Mood: LCSW to follow for evaluation and support.  5. Neuropsych: This patient iscapable of making decisions on herown behalf. 6. Skin/Wound Care: routine pressure relief measures.  --staples out 7. Fluids/Electrolytes/Nutrition/dysphagia: Monitor I/O. BUN   normal 13  -SLP to follow up re: safety with diet  -D2 thins currently  -potassium 3.8 on 10/18---continue supplement 8. Nausea: Likely central. Continue full liquid diet. Zofran not effective--continue promethizine prn.  9. H/o A Fib: Managed with diltiazem. Monitor HR bid. HR controlled 10/19 Vitals:   01/23/17 2249 01/24/17 0556  BP: (!) 131/44 (!) 140/54  Pulse: 74 79  Resp:  20  Temp:  98.1 F (36.7 C)  SpO2: 100% 94%   10. Emphysema/Bronchiectasis: continue nebulizers.  chest PT bid as at home. Continue oxygen at nights and prn during the day.  11. Leucocytosis: resolved   -likely steroid related   -6.4 10/15 12. Constipation: apparent reacation to senokot-s.    -use prn miralax  -encourage fruits/fiber  -encourage PO fluids 13.  GERD-  Protonix  Increased to 40mg  bid with improvement   LOS (Days) 14 A FACE TO FACE EVALUATION WAS PERFORMED  Meredith Staggers, MD 01/24/2017 9:25 AM

## 2017-01-24 NOTE — Progress Notes (Signed)
Patient complaining of feeling "too tired" this am. Patient reports she did not sleep very well due to CT procedure and staff in and out of room during the night. Patient participated in OT session this am but unable to tolerate PT session. Patient is now transferred via maxi move due to safety issues and inability to assist with transfers. Awaiting neuro MD. Continue with plan of care . Tracy Bridges

## 2017-01-24 NOTE — Progress Notes (Signed)
Social Work Patient ID: Tracy Bridges, female   DOB: 11/01/1940, 76 y.o.   MRN: [id number]   Have touched base with pt and spouse per info given by Marlowe Shores, PA.  Planned d/c for tomorrow now on hold due to medical issues.  Await neurosurgery input.  Pt reports HA and keeps eyes closed as we talk.  Will follow along with team.  Lennart Pall, LCSW

## 2017-01-24 NOTE — Progress Notes (Signed)
Occupational Therapy Session Note  Patient Details  Name: Tracy Bridges MRN: [id number] Date of Birth: 04-25-1940  Today's Date: 01/24/2017 OT Individual Time: 3202-3343 OT Individual Time Calculation (min): 79 min   Skilled Therapeutic Interventions/Progress Updates:    Tx focus on ADL retraining, activity tolerance, functional transfers, and family education.  Pt greeted in w/c with spouse present. Reporting (and presenting with) significant lethargy and fatigue. Spouse reported she had not slept last night and went out for a procedure past 11pm. Agreeable to complete BADLs w/c level at sink. Pt with increased R UE flaccidity compared with past sessions this week, required assist during bathing for positioning and washing affected arm. Sit<stand with Mod-Max A and max exertion from pt in order to complete pericare and apply cream to buttocks. Pt requiring Max A to don pants and was dependent for footwear. Frequent rest breaks provided due to increased breathing rate. Stand pivot transfer to toilet with Mod A and grab bars. Pt voiding bladder and requiring 2 assist for perihygiene completion with one helper standing in front of her for balance. Stand pivot completed back to w/c and then to recliner. Pt declining Teds/ace wraps but agreeable to keep LEs elevated in recliner. Pt repositioned for comfort and left with all needs within reach.   Spouse provided with education/hands on transfer training throughout session. Educated him to provide physical assist at the hips instead of underarms to prevent Rt subluxation/shoulder injury.   2nd Session (missed 60 min) Pt seen for afternoon session, was in bed, still very fatigue/lethargic and refusing to participate in tx at this time. Will see her for make up time later this week.   Therapy Documentation Precautions:  Precautions Precautions: Fall Restrictions Weight Bearing Restrictions: No General: General PT Missed Treatment Reason: Patient  fatigue;Other (Comment) Vital Signs: Oxygen Therapy O2 Device: Not Delivered Pain: No c/o pain during tx    ADL:     See Function Navigator for Current Functional Status.  Therapy/Group: Individual Therapy  Braeson Rupe A Zahra Peffley 01/24/2017, 12:01 PM

## 2017-01-24 NOTE — Progress Notes (Signed)
Patient refused CPT with the vest this am.  RT will continue to monitor.

## 2017-01-24 NOTE — Plan of Care (Signed)
Problem: RH BOWEL ELIMINATION Goal: RH STG MANAGE BOWEL WITH ASSISTANCE STG Manage Bowel with mod  Assistance.    Outcome: Not Progressing Patient requires max assist . Patient refusing suppository today.

## 2017-01-24 NOTE — Progress Notes (Signed)
Final CT results came in. Notified MD

## 2017-01-25 ENCOUNTER — Ambulatory Visit: Payer: Medicare HMO | Admitting: Internal Medicine

## 2017-01-25 ENCOUNTER — Inpatient Hospital Stay (HOSPITAL_COMMUNITY): Payer: Medicare HMO

## 2017-01-25 ENCOUNTER — Telehealth: Payer: Self-pay | Admitting: Internal Medicine

## 2017-01-25 ENCOUNTER — Inpatient Hospital Stay (HOSPITAL_COMMUNITY): Payer: Medicare HMO | Admitting: Occupational Therapy

## 2017-01-25 DIAGNOSIS — I482 Chronic atrial fibrillation: Secondary | ICD-10-CM

## 2017-01-25 NOTE — Patient Care Conference (Signed)
Inpatient RehabilitationTeam Conference and Plan of Care Update Date: 01/25/2017   Time: 2:30 PM    Patient Name: Tracy Bridges      Medical Record Number: [id number]  Date of Birth: October 11, 1940 Sex: Female         Room/Bed: 4W26C/4W26C-01 Payor Info: Payor: [employer] MEDICARE / Plan: [place] MEDICARE HMO/PPO / Product Type: *No Product type* /    Admitting Diagnosis: Brain Tumor DVI  Admit Date/Time:  01/10/2017  4:17 PM Admission Comments: No comment available   Primary Diagnosis:  Metastatic cancer to brain [facility]) Principal Problem: Metastatic cancer to brain [facility])  Patient Active Problem List   Diagnosis Date Noted  . Right foot pain   . Thrombocytopenia ([place])   . Acute blood loss anemia   . Hypokalemia   . Prerenal azotemia 01/12/2017  . Vascular headache   . Non-intractable vomiting with nausea   . Leukocytosis   . DVT (deep venous thrombosis) ([place]) 01/09/2017  . Metastatic cancer to brain ([place]) 01/07/2017  . Brain tumor ([place]) 12/30/2016  . 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 Atrial fibrillation ([place]) 12/26/2016  . Emphysema/COPD ([place]) 12/26/2016  . Brain mass 12/26/2016  . Paresthesia of right arm 12/26/2016  . 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 Right sided weakness   . History of lung cancer   . GERD (gastroesophageal reflux disease) 12/03/2014    Expected Discharge Date: Expected Discharge Date: 02/04/17  Team Members Present: Physician leading conference: Dr. Alger Simons Social Worker Present: Lennart Pall, LCSW Nurse Present: Elliot Cousin, RN PT Present: Canary Brim, PT OT Present: Willeen Cass, OT SLP Present: Weston Anna, SLP PPS Coordinator present : Daiva Nakayama, RN, CRRN     Current Status/Progress Goal Weekly Team Focus  Medical   unfornutately she has recurrent tumor/edema which has worsened clinical picture  stabilize medicall,  reduce cerebral edema, onc/NS involvement   Bowel/Bladder   Continent of bowel & bladder, small BM 01/24/17  after 3 days, does not like suppositories, no other bowel regimen  continue continence  continue to monitor & assist as needed   Swallow/Nutrition/ Hydration             ADL's   Max A bathing w/c level at sink, Mod A UB dressing, Max A LB dressing sit<stand at sink, 2 helpers toileting tasks, Mod-Max A stand pivot toilet transfers with and without [name]  Downgraded to Min A overall  Activity tolerance, Rt NMR, general strengthening, balance, functional transfers, pt/family education   Mobility   limited participation due to medical status change last couple days; requiring max/total assist at this time  downgraded to min assist w/c level with short distance gait - may need to modify due to medical status change once input from neuro  family education, transfers, R NMR, balance, participation   Communication             Safety/Cognition/ Behavioral Observations            Pain   no c/o pain, has numbness to RUE, RLE  pain scale <3  assess & treat as needed   Skin   incision to scalp approximated & healing well, scabbed, right & left buttock excoriated area, has gerdhart butt cream prn  no new areas of skin break down  assess q shift and prn    Rehab Goals Patient on target to meet rehab goals: No Rehab Goals Revised: decline in overall function due to medical issues *See Care Plan and progress  notes for long and short-term goals.     Barriers to Discharge  Current Status/Progress Possible Resolutions Date Resolved   Physician    Medical stability        see above      Nursing                  PT  Medical stability;Pending chemo/radiation;Other (comments)  medical decline last couple days; husband ok to do min assist but pt requiring max at this time              OT Medical stability                SLP                SW                Discharge Planning/Teaching Needs:  Pt to return home with her spouse who can provide 24/7 assistance.  Teaching to be completed closer to  d/c.   Team Discussion:  Medical issues (see above);  Anticipate oncology to see her tomorrow for input.  Neurosurgery expect conservative treatment with restart of IV steroids.  Some better in therapies today and more alert.  Goals for min assist w/c level but anticipate need to change transfer technique for safety.  Need to begin including additional caregivers to assist spouse.  Team recommends new target d/c date of 11/2.  Revisions to Treatment Plan:  Several goals changed as well as target d/c.    Continued Need for Acute Rehabilitation Level of Care: The patient requires daily medical management by a physician with specialized training in physical medicine and rehabilitation for the following conditions: Daily direction of a multidisciplinary physical rehabilitation program to ensure safe treatment while eliciting the highest outcome that is of practical value to the patient.: Yes Daily medical management of patient stability for increased activity during participation in an intensive rehabilitation regime.: Yes Daily analysis of laboratory values and/or radiology reports with any subsequent need for medication adjustment of medical intervention for : Post surgical problems  Tonnya Garbett 01/25/2017, 3:23 PM

## 2017-01-25 NOTE — Progress Notes (Signed)
Physical Therapy Session Note  Patient Details  Name: Tracy Bridges MRN: [id number] Date of Birth: [birth year]  Today's Date: 01/25/2017 PT Individual Time: 0830-0930 PT Individual Time Calculation (min): 60 min   Short Term Goals: Week 2:  PT Short Term Goal 1 (Week 2): = LTGS  Skilled Therapeutic Interventions/Progress Updates:    Denies pain. Pt much more alert this morning. Reports she got good rest and is feeling better though still frustrated by significant weakness in RUE/RLE. Trace activation noted today in RLE upon command for hip flexion and movement in toes; able to initiate slightly against gravity for knee extension but unable to sustain. Pt finished breakfast EOB and PT donned Teds for edema control. Assisted with donning pants and perform sit <> stand with mod assist using bedrail on L for support and PT blocking RLE with total assist to pull up pants. Performed max assist stand pivot to w/c with blocking of RLE and facilitation of anterior weightshift during sit -> stand. Requires rest break before repositioning and mod assist for next sit <> stand to reposition in w/c. Pt declines use of lap tray for RUE support due to claustrophobia so utilized pillow for RUE positioning and support. Engaged in hygiene at sink for oral care and washing face with set-up assistance and extra time due to non functional use of RUE. NMR for functional use of R hand while emptying hair from comb with hand over hand assist and reciprocal movement pattern of BLE during w/c mobility for assist with activation of RLE (utilized LUE for power) within room demonstrating improved active movement in RLE.  Therapy Documentation Precautions:  Precautions Precautions: Fall Restrictions Weight Bearing Restrictions: No  See Function Navigator for Current Functional Status.   Therapy/Group: Individual Therapy  Canary Brim [facility] 01/25/2017, 10:35 AM

## 2017-01-25 NOTE — Progress Notes (Signed)
Occupational Therapy Session Note  Patient Details  Name: Tracy Bridges MRN: [id number] Date of Birth: July 05, 1940  Today's Date: 01/25/2017 OT Individual Time: 1400-1515 OT Individual Time Calculation (min): 75 min    Skilled Therapeutic Interventions/Progress Updates:    1;1. Pt with c/o pain in L thumb/numbness as reaction from steroid. RN aware. RT engages pt ion conversation throughout session about leisure interest and is available as +2 for clothing management during toileting. Pt washes hands with support at elbow to reach RUE up to LUE for lathering with body wash. OT propels w/c for energy conservation and time management. Pt completes 2x10 towel glides forward/back, horizontal, circles, and diagonals for gravity eliminated movement in RUE and weight bearing through forearm with prolonged rest breaks and tactile feedback at scapula for proper movement. Pt completes 1 sit to stand with MOD A at high low table with R knee blocking and Vc for posture. Pt reporting dizziness after standing ~1 min. Dizziness subsides with pursed lip breathing and water. Pt reports needing to toilet and stand pivot transfer w/c<>BSC over toilet with A for lifting and RLE management during pivot. RT provides A for all steps of toileting and OT provides MOD A for balance and VC for terminal hip extension. Pt able to void both bowel and bladder with increased time. Pt transfers w/c>recliner in same manner as above. Exited session with pt seated in recliner with RUE elevated on pillow for edema management, call light in reach and husband in room.   Therapy Documentation Precautions:  Precautions Precautions: Fall Restrictions Weight Bearing Restrictions: No  See Function Navigator for Current Functional Status.   Therapy/Group: Individual Therapy  Tonny Branch 01/25/2017, 7:58 AM

## 2017-01-25 NOTE — Progress Notes (Signed)
Recreational Therapy Session Note  Patient Details  Name: LATEYA DAURIA MRN: [id number] Date of Birth: 1940/05/22 Today's Date: 01/25/2017  Pain: c/o L thumb numbness.  Pt & husband state she has this issue when taking prednisone.  RN aware. Skilled Therapeutic Interventions/Progress Updates: Session focused on activity tolerance, sitting balance, RUE glided movements, sit-stands, standing tolerance, toileting during co-treat with OT.  Pt performed sit-stand with mod assist standing ~1 minute before c/o dizziness.  Pt returned to seated position and instructed in pursed lip breathing and requested water.  Once seated, dizziness decreased and pt expressed need for toileting.  Returned to the room with +2 assist for toileting.    Therapy/Group: Co-Treatment  Marquin Patino 01/25/2017, 3:52 PM

## 2017-01-25 NOTE — Plan of Care (Signed)
Problem: RH Car Transfers Goal: LTG Patient will perform car transfers with assist (PT) LTG: Patient will perform car transfers with assistance (PT).  Downgraded due to functional decline  Problem: RH Ambulation Goal: LTG Patient will ambulate in home environment (PT) LTG: Patient will ambulate in home environment, # of feet with assistance (PT).  Discharged ABG

## 2017-01-25 NOTE — Plan of Care (Signed)
Problem: RH Stairs Goal: LTG Patient will ambulate up and down stairs w/assist (PT) LTG: Patient will ambulate up and down # of stairs with assistance (PT)  Downgraded ABG

## 2017-01-25 NOTE — Progress Notes (Signed)
[place] [facility]ILIT[facility]     PROGRESS NOTE    Subjective/Complaints: Was able to sleep much better last night. Still with substantial right sided weakness  ROS: limited due to language/communication   Objective: Vital Signs: Blood pressure (!) 125/55, pulse 76, temperature 97.9 F (36.6 C), temperature source Oral, resp. rate 17, height 5\' 5"  (1.651 m), weight 78.7 kg (173 lb 6.4 oz), SpO2 98 %. Ct Head Wo Contrast  Result Date: 01/23/2017 CLINICAL DATA:  Initial evaluation for generalized weakness. History of metastatic lung cancer. EXAM: CT HEAD WITHOUT CONTRAST TECHNIQUE: Contiguous axial images were obtained from the base of the skull through the vertex without intravenous contrast. COMPARISON:  Prior MRI from 12/30/2016. FINDINGS: Brain: Postoperative changes from prior left parietal craniotomy for tumor excision. Residual tumor or at the left frontal parietal junction is present, measuring approximately 2.9 cm (series 3, image 24). Exact measurements difficult on this noncontrast examination. Associated vasogenic edema with regional mass effect is increased relative to previous MRI. No significant midline shift. No hydrocephalus or ventricular trapping. No other appreciable metastatic lesions. No acute intracranial hemorrhage. No evidence for acute large vessel territory infarct. No extra-axial fluid collection. Vascular: No hyperdense vessel. Scattered vascular calcifications noted within the carotid siphons. Skull: Sequelae of prior craniotomy at the left parietal calvarium. Overlying skin staples noted. Sinuses/Orbits: Globes and orbital soft tissues within normal limits. Paranasal sinuses are largely clear. No mastoid effusion. Other: None. IMPRESSION: 1. Sequelae of prior left parietal craniotomy for tumor resection. Suspected residual tumor at the left frontoparietal junction with worsened localized vasogenic edema as compared to previous MRI. Associated regional  mass effect without midline shift. 2. No other acute intracranial process identified. Electronically Signed   By: Jeannine Boga M.D.   On: 01/23/2017 23:50    Recent Labs  01/24/17 2155  WBC 5.1  HGB 11.7*  HCT 36.0  PLT 249    Recent Labs  01/24/17 2155  NA 138  K 4.3  CL 101  GLUCOSE 200*  BUN 19  CREATININE 1.00  CALCIUM 9.1   CBG (last 3)  No results for input(s): GLUCAP in the last 72 hours.  Wt Readings from Last 3 Encounters:  01/25/17 78.7 kg (173 lb 6.4 oz)  01/10/17 81.8 kg (180 lb 6.4 oz)  01/09/17 77.7 kg (171 lb 3.2 oz)    Physical Exam:  Constitutional: She is oriented to person, place, and time. She appears well-developedand well-nourished. No distress but does look a little fatigued.  HENT:  Head: Normocephalicand atraumatic.  Eyes: EOMI. No discharge.  Neck: Normal range of motion. Neck supple.  Cardiovascular: RRR without murmur. No JVD   . Respiratory: CTA Bilaterally without wheezes or rales. Normal effort     GI: BS +, non-tender, non-distended   Musculoskeletal: She exhibits trace RLE edema --stable Neurological: She is alertand oriented to person, place, and time.  Speech more spontaneous today, a little more fluid Decreased LT right leg.    RUE   1-2/5 deltoid, biceps, triceps, wrist, HI. ---no changes  RLE: 0-tr/5 prox to distal.    Skin: Skin is warmand dry.   Psychiatric: flat, discouraged.   Assessment/Plan: 1. 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 Physiatrist is providing close team supervision and 24 hour management of active medical problems listed below. Physiatrist and rehab team continue to assess barriers to discharge/monitor  patient progress toward functional and medical goals.  Function:  Bathing Bathing position   Position: Wheelchair/chair at sink   Bathing parts Body parts bathed by patient: Chest, Abdomen, Front perineal area, Right upper leg, Left upper leg Body parts bathed by helper: Right arm, Left arm, Buttocks, Right lower leg, Left lower leg, Back  Bathing assist Assist Level:  (Mod A)      Upper Body Dressing/Undressing Upper body dressing   What is the patient wearing?: Pull over shirt/dress     Pull over shirt/dress - Perfomed by patient: Thread/unthread left sleeve, Put head through opening Pull over shirt/dress - Perfomed by helper: Thread/unthread right sleeve, Pull shirt over trunk        Upper body assist Assist Level:  (Mod A)   Set up : To obtain clothing/put away  Lower Body Dressing/Undressing Lower body dressing   What is the patient wearing?: Non-skid slipper socks, Underwear, Pants Underwear - Performed by patient: Thread/unthread left underwear leg Underwear - Performed by helper: Thread/unthread right underwear leg, Pull underwear up/down Pants- Performed by patient: Thread/unthread left pants leg Pants- Performed by helper: Thread/unthread right pants leg, Pull pants up/down Non-skid slipper socks- Performed by patient: Don/doff right sock, Don/doff left sock Non-skid slipper socks- Performed by helper: Don/doff right sock, Don/doff left sock               TED Hose - Performed by helper:  (pt refusing Teds/ace wraps)  Lower body assist Assist for lower body dressing:  (Max A sit<stand in [place])      Psychologist, occupational   Toileting steps completed by patient: Adjust clothing prior to toileting, Adjust clothing after toileting Toileting steps completed by helper: Adjust clothing prior to toileting, Performs perineal hygiene, Adjust clothing after toileting Toileting Assistive Devices: Other (comment)  Toileting assist Assist level: Two helpers   Transfers Chair/bed transfer   Chair/bed transfer method: Stand pivot Chair/bed transfer assist level: Moderate assist (Pt 50 - 74%/lift or  lower) Chair/bed transfer assistive device: Mechanical lift Mechanical lift: [name]   Locomotion Ambulation     Max distance: 12' Assist level: Touching or steadying assistance (Pt > 75%)   Wheelchair   Type: Manual Max wheelchair distance: 69ft Assist Level: Touching or steadying assistance (Pt > 75%)  Cognition Comprehension Comprehension assist level: Follows complex conversation/direction with extra time/assistive device  Expression Expression assist level: Expresses complex ideas: With extra time/assistive device  Social Interaction Social Interaction assist level: Interacts appropriately with others - No medications needed.  Problem Solving Problem solving assist level: Solves complex 90% of the time/cues < 10% of the time  Memory Memory assist level: More than reasonable amount of time   Medical Problem List and Plan: 1. Functional deficits and right hemiparesissecondary to metastatic lung cancer to the brain. -CT reviewed :increased edema at site of residual tumor, left-fronto-parietal area, substantial worsening Right HP, aphasic       -initiated decadron iv 8mg  q6     -contacted and spoke with Dr. Cyndy Freeze who agrees with plan. He has not seen patient  -I am reaching out to oncology for assistance with plan      -discussed with patient and husband re: plan            -may continue with therapies as tolerated. (feels better today) 2. DVT left gastroc: Mechanical: Sequential compression devices, below knee Bilateral lower extremities, IVC filter in place.   -discussed elevation of legs when resting, compression stockings, etc  3. Headaches/Pain Management:  Headaches persistent and being managed with prn hydocodone.  4. Mood: LCSW to follow for evaluation and support.  5. Neuropsych: This patient iscapable of making decisions on herown behalf. 6. Skin/Wound Care: routine pressure relief measures.  --staples out 7. Fluids/Electrolytes/Nutrition/dysphagia:  Monitor I/O. BUN   normal 13  -SLP to follow up re: safety with diet  -D2 thins currently  -potassium 3.8 on 10/18---continue supplement 8. Nausea: Likely central. Continue full liquid diet. Zofran not effective--continue promethizine prn.  9. H/o A Fib: Managed with diltiazem. Monitor HR bid. HR controlled 10/19 Vitals:   01/24/17 2119 01/25/17 0440  BP:  (!) 125/55  Pulse:  76  Resp:  17  Temp:  97.9 F (36.6 C)  SpO2: 98%    10. Emphysema/Bronchiectasis: continue nebulizers.  chest PT bid as at home. Continue oxygen at nights and prn during the day.  11. Leucocytosis: resolved   -likely steroid related   -6.4 10/15 12. Constipation: apparent reacation to senokot-s.    -use prn miralax  -encourage fruits/fiber  -encourage PO fluids 13.  GERD-  Protonix  Increased to 40mg  bid with improvement   LOS (Days) 15 A FACE TO FACE EVALUATION WAS PERFORMED  Alger Simons T, MD 01/25/2017 9:09 AM

## 2017-01-25 NOTE — Progress Notes (Signed)
Occupational Therapy Session Note  Patient Details  Name: Tracy Bridges MRN: [id number] Date of Birth: 05/23/1940  Today's Date: 01/25/2017 OT Individual Time: 1210-1310 OT Individual Time Calculation (min): 60 min    Short Term Goals: Week 2:  OT Short Term Goal 1 (Week 2): Pt will complete UB dressing with Min A OT Short Term Goal 2 (Week 2): Pt will consistently complete sit<stand during LB dressing with Mod A OT Short Term Goal 3 (Week 2): Pt will complete 1/3 components of toileting   Skilled Therapeutic Interventions/Progress Updates: patient required much encouragement to participated in Self ROM exercises to increase use of R UE, lunch set up and this clinician applied bilateral LE elevating leg rests.    She was left seated in her w/c with call bell in place and with RN preparing to give meds.     Therapy Documentation Precautions:  Precautions Precautions: Fall Restrictions Weight Bearing Restrictions: No Pain:denied   Therapy/Group: Individual Therapy  Alfredia Ferguson [facility] 01/25/2017, 2:03 PM

## 2017-01-26 ENCOUNTER — Inpatient Hospital Stay (HOSPITAL_COMMUNITY): Payer: Medicare HMO | Admitting: Physical Therapy

## 2017-01-26 ENCOUNTER — Inpatient Hospital Stay (HOSPITAL_COMMUNITY): Payer: Medicare HMO | Admitting: Occupational Therapy

## 2017-01-26 ENCOUNTER — Ambulatory Visit
Admit: 2017-01-26 | Discharge: 2017-01-26 | Disposition: A | Discharge status: Home / Self Care | Payer: Medicare HMO | Attending: Radiation Oncology | Admitting: Radiation Oncology

## 2017-01-26 DIAGNOSIS — C3490 Malignant neoplasm of unspecified part of unspecified bronchus or lung: Secondary | ICD-10-CM

## 2017-01-26 DIAGNOSIS — C7931 Secondary malignant neoplasm of brain: Secondary | ICD-10-CM

## 2017-01-26 NOTE — Consult Note (Signed)
Radiation Oncology         [phone] ________________________________  Name: Tracy Bridges        MRN: [id number]  Date of Service: 01/26/17 DOB: 07/23/1940  CC:Sherrilee Gilles, DO  No ref. provider found     REFERRING PHYSICIAN: No ref. provider found   DIAGNOSIS: The primary encounter diagnosis was Metastatic cancer to brain [facility]). A diagnosis of Injury was also pertinent to this visit.   HISTORY OF PRESENT ILLNESS: Tracy Bridges is a 76 y.o. female seen at the request of Dr. 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 The patient presented to the ED with seizure activity and was found by MRI to have a 3.8 x 2.8 x 4.2 c mass int he left frontal parietal lobe and mild surrounding vasogenic edema. 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 She's been followed by Dr. Farris Has in [place], [place], a pulmonologist and was released for cancer surveillance from [place] several years ago. She has not had any known recurrence, and her metastatic work up here was negative by CT imaging. She underwent craniotomy with resection of her tumor on 12/30/16. Her postop scan revealed enhancement measuring  X 13 x 14 mm along the anterior cavity. Final pathology from her resection revealed metastatic carcinoma consistent with lung primary. She was discussed in our brain and spine oncology conference and felt to be an appropriate candidate for postoperative stereotactice radiosurgery [facility]). She has been recovering at [facility] rehab. She was supposed to be discharged today but a few days ago did have some neurologic set back with increased weakness and her steroids were increased. She has since improved with that change. She is seen today to discuss SRS.      PREVIOUS RADIATION THERAPY: No   PAST MEDICAL HISTORY:  Past Medical History:  Diagnosis Date  . Asthma   .  Atrial fibrillation ([place])   . Emphysema/COPD ([place])   . Lung cancer ([place])        PAST SURGICAL HISTORY: Past Surgical History:  Procedure Laterality Date  . ABDOMINAL HYSTERECTOMY    . APPLICATION OF CRANIAL NAVIGATION Left 12/30/2016   Procedure: APPLICATION OF CRANIAL NAVIGATION;  Surgeon: Ashok Pall, MD;  Location: [place];  Service: Neurosurgery;  Laterality: Left;  APPLICATION OF CRANIAL NAVIGATION  . CESAREAN SECTION    . CRANIOTOMY Left 12/30/2016   Procedure: CRANIOTOMY TUMOR EXCISION;  Surgeon: Ashok Pall, MD;  Location: [place];  Service: Neurosurgery;  Laterality: Left;  CRANIOTOMY TUMOR EXCISION  . HERNIA REPAIR    . IR IVC FILTER PLMT / S&I /IMG GUID/MOD SED  01/10/2017  . IR IVC FILTER REPOSITION / S&I /IMG GUID/MOD SED  01/10/2017  . LUNG REMOVAL, PARTIAL  04/1997     FAMILY HISTORY:  Family History  Problem Relation Age of Onset  . Liver cancer Other   . Heart disease Other   . Brain cancer Neg Hx      SOCIAL HISTORY:  reports that she quit smoking about 38 years ago. Her smoking use included Cigarettes. She has never used smokeless tobacco. She reports that she does not drink alcohol or use drugs.   ALLERGIES: Aspirin; Atenolol; Boniva [ibandronic acid]; Fluticasone; Fosamax [alendronate sodium]; Latex; Mucinex [guaifenesin er]; Rocephin [ceftriaxone sodium in dextrose]; Rotateq [rotavirus vaccine live oral]; Sulfa antibiotics; Vancomycin; Senokot s [sennosides-docusate sodium];  Penicillins; and Tobramycin sulfate   MEDICATIONS:  Current Facility-Administered Medications  Medication Dose Route Frequency Provider Last Rate Last Dose  . acetaminophen (TYLENOL) tablet 325-650 mg  325-650 mg Oral Q4H PRN Love, Pamela S, PA-C      . alum & mag hydroxide-simeth (MAALOX/MYLANTA) 200-200-20 MG/5ML suspension 30 mL  30 mL Oral Q4H PRN Love, Pamela S, PA-C      . arformoterol (BROVANA) nebulizer solution 15 mcg  15 mcg Nebulization BID Reesa Chew S, PA-C   15 mcg at  01/26/17 1034  . bisacodyl (DULCOLAX) suppository 10 mg  10 mg Rectal Daily PRN Bary Leriche, PA-C   10 mg at 01/24/17 2205  . dexamethasone (DECADRON) injection 8 mg  8 mg Intravenous Q6H Meredith Staggers, MD   8 mg at 01/26/17 1206  . diltiazem (CARDIZEM CD) 24 hr capsule 240 mg  240 mg Oral QHS Bary Leriche, PA-C   240 mg at 01/25/17 2105  . diphenhydrAMINE (BENADRYL) 12.5 MG/5ML elixir 12.5-25 mg  12.5-25 mg Oral Q6H PRN Love, Pamela S, PA-C      . furosemide (LASIX) tablet 20 mg  20 mg Oral Daily PRN Bary Leriche, PA-C   20 mg at 01/24/17 1049  . Gerhardt's butt cream   Topical TID PRN Angiulli, Lavon Paganini, PA-C      . ipratropium-albuterol (DUONEB) 0.5-2.5 (3) MG/3ML nebulizer solution 3 mL  3 mL Nebulization Q4H PRN Meredith Staggers, MD   3 mL at 01/21/17 1953  . levETIRAcetam (KEPPRA) tablet 500 mg  500 mg Oral BID Bary Leriche, PA-C   500 mg at 01/26/17 0825  . LORazepam (ATIVAN) injection 1-2 mg  1-2 mg Intravenous Q6H PRN Love, Pamela S, PA-C      . methocarbamol (ROBAXIN) tablet 500 mg  500 mg Oral Q8H PRN Jamse Arn, MD      . nystatin (MYCOSTATIN/NYSTOP) topical powder   Topical TID Love, Pamela S, PA-C      . pantoprazole (PROTONIX) EC tablet 40 mg  40 mg Oral BID Charlett Blake, MD   40 mg at 01/26/17 4782  . potassium chloride 20 MEQ/15ML (10%) solution 20 mEq  20 mEq Oral Daily Meredith Staggers, MD   20 mEq at 01/26/17 0825  . prochlorperazine (COMPAZINE) tablet 5-10 mg  5-10 mg Oral Q6H PRN Bary Leriche, PA-C   10 mg at 01/13/17 9562   Or  . prochlorperazine (COMPAZINE) injection 5-10 mg  5-10 mg Intramuscular Q6H PRN Love, Pamela S, PA-C       Or  . prochlorperazine (COMPAZINE) suppository 12.5 mg  12.5 mg Rectal Q6H PRN Love, Pamela S, PA-C      . sodium chloride flush (NS) 0.9 % injection 10-40 mL  10-40 mL Intracatheter PRN Meredith Staggers, MD   10 mL at 01/25/17 1601  . sodium phosphate (FLEET) 7-19 GM/118ML enema 1 enema  1 enema Rectal Once PRN  Love, Pamela S, PA-C      . traZODone (DESYREL) tablet 25-50 mg  25-50 mg Oral QHS PRN Love, Ivan Anchors, PA-C         REVIEW OF SYSTEMS: On review of systems, the patient reports that she is doing well overall. She feels like her weakness is improving and denies any chest pain, shortness of breath, cough, fevers, chills, night sweats, unintended weight changes. She denies any bowel or bladder disturbances, and denies abdominal pain, nausea or vomiting. She denies any headaches, changes in  visual acuity or in her hearing. She denies any new musculoskeletal or joint aches or pains. A complete review of systems is obtained and is otherwise negative.     PHYSICAL EXAM:  Wt Readings from Last 3 Encounters:  01/26/17 172 lb (78 kg)  01/10/17 180 lb 6.4 oz (81.8 kg)  01/09/17 171 lb 3.2 oz (77.7 kg)   Temp Readings from Last 3 Encounters:  01/26/17 97.9 F (36.6 C) (Oral)  01/10/17 98.6 F (37 C) (Oral)  01/10/17 98.4 F (36.9 C)   BP Readings from Last 3 Encounters:  01/26/17 (!) 123/53  01/10/17 132/68  01/10/17 (!) 120/39   Pulse Readings from Last 3 Encounters:  01/26/17 78  01/10/17 65  01/10/17 (!) 56   Pain Assessment Pain Score: 0-No pain/10  In general this is a well appearing caucasian female in no acute distress. She's alert and oriented x4 and appropriate throughout the examination. Cardiopulmonary assessment is negative for acute distress and she exhibits normal effort. Her craniotomy site is healing well. No erythema is noted.     ECOG = 2  0 - Asymptomatic (Fully active, able to carry on all predisease activities without restriction)  1 - Symptomatic but completely ambulatory (Restricted in physically strenuous activity but ambulatory and able to carry out work of a light or sedentary nature. For example, light housework, office work)  2 - Symptomatic, <50% in bed during the day (Ambulatory and capable of all self care but unable to carry out any work activities.  Up and about more than 50% of waking hours)  3 - Symptomatic, >50% in bed, but not bedbound (Capable of only limited self-care, confined to bed or chair 50% or more of waking hours)  4 - Bedbound (Completely disabled. Cannot carry on any self-care. Totally confined to bed or chair)  5 - Death   Eustace Pen MM, Creech RH, Tormey DC, et al. [phone]). "Toxicity and response criteria of the [facility] Group". [place] Oncol. 5 (6): 649-55    LABORATORY DATA:  Lab Results  Component Value Date   WBC 5.1 01/24/2017   HGB 11.7 (L) 01/24/2017   HCT 36.0 01/24/2017   MCV 93.5 01/24/2017   PLT 249 01/24/2017   Lab Results  Component Value Date   NA 138 01/24/2017   K 4.3 01/24/2017   CL 101 01/24/2017   CO2 27 01/24/2017   Lab Results  Component Value Date   ALT 26 01/11/2017   AST 20 01/11/2017   ALKPHOS 47 01/11/2017   BILITOT 1.0 01/11/2017      RADIOGRAPHY: Ct Head Wo Contrast  Result Date: 01/23/2017 CLINICAL DATA:  Initial evaluation for generalized weakness. History of metastatic lung cancer. EXAM: CT HEAD WITHOUT CONTRAST TECHNIQUE: Contiguous axial images were obtained from the base of the skull through the vertex without intravenous contrast. COMPARISON:  Prior MRI from 12/30/2016. FINDINGS: Brain: Postoperative changes from prior left parietal craniotomy for tumor excision. Residual tumor or at the left frontal parietal junction is present, measuring approximately 2.9 cm (series 3, image 24). Exact measurements difficult on this noncontrast examination. Associated vasogenic edema with regional mass effect is increased relative to previous MRI. No significant midline shift. No hydrocephalus or ventricular trapping. No other appreciable metastatic lesions. No acute intracranial hemorrhage. No evidence for acute large vessel territory infarct. No extra-axial fluid collection. Vascular: No hyperdense vessel. Scattered vascular calcifications noted within the carotid  siphons. Skull: Sequelae of prior craniotomy at the left parietal calvarium. Overlying  skin staples noted. Sinuses/Orbits: Globes and orbital soft tissues within normal limits. Paranasal sinuses are largely clear. No mastoid effusion. Other: None. IMPRESSION: 1. Sequelae of prior left parietal craniotomy for tumor resection. Suspected residual tumor at the left frontoparietal junction with worsened localized vasogenic edema as compared to previous MRI. Associated regional mass effect without midline shift. 2. No other acute intracranial process identified. Electronically Signed   By: Jeannine Boga M.D.   On: 01/23/2017 23:50   Ct Head Wo Contrast  Result Date: 01/01/2017 CLINICAL DATA:  Nausea and vertigo. Postop tumor resection 12/30/2016. EXAM: CT HEAD WITHOUT CONTRAST TECHNIQUE: Contiguous axial images were obtained from the base of the skull through the vertex without intravenous contrast. COMPARISON:  Brain MRI 12/30/2016 FINDINGS: Brain: Postoperative changes are again seen in the left frontoparietal region near the vertex related to recent tumor resection. Small volume blood products are again seen at the resection site, not grossly increased compared to the MRI. Mild pneumocephalus is noted. Surrounding vasogenic edema has not significantly changed. Patchy hypodensities elsewhere in the cerebral white matter bilaterally are nonspecific but compatible with moderate chronic small vessel ischemic disease. There is no evidence of acute large territory infarct, new intracranial hemorrhage, or midline shift. A very small extra-axial hematoma is noted subjacent to the craniotomy, similar to the MRI and without significant mass effect. There is mild cerebral atrophy. Vascular: Calcified atherosclerosis at the skullbase. No hyperdense vessel. Skull: Left frontoparietal craniotomy.  Skin staples in place. Sinuses/Orbits: Bilateral cataract extraction. Prior functional sinus surgery. Clear mastoid air cells.  Other: None. IMPRESSION: 1. Similar appearance of postoperative changes in the left frontoparietal region. 2. No evidence of acute intracranial abnormality. Electronically Signed   By: Logan Bores M.D.   On: 01/01/2017 10:50   Stealth Ct Head W Contrast  Result Date: 12/29/2016 CLINICAL DATA:  Pre-surgical planning for solitary frontoparietal mass. History of lung cancer. EXAM: CT HEAD WITH CONTRAST TECHNIQUE: Contiguous axial images were obtained from the base of the skull through the vertex with intravenous contrast. CONTRAST:  39m ISOVUE-300 IOPAMIDOL (ISOVUE-300) INJECTION 61% COMPARISON:  CT HEAD and MRI head December 26, 2016 FINDINGS: BRAIN: 2.8 x 3.5 x 3.5 cm (transverse by AP by CC) heterogeneously enhancing intraparenchymal LEFT frontoparietal mass involving gray-white matter again noted with surrounding low-density vasogenic edema and regional mass effect. Mass does not extend to superior sagittal sinus. No dural tail. No midline shift. No hydrocephalus. No acute large vascular territory infarcts. Patchy supratentorial white matter hypodensities exclusive of the aforementioned abnormality. Limited assessment for hemorrhage on this postcontrast examination. No abnormal extra-axial fluid collections. VASCULAR: Moderate calcific atherosclerosis of the carotid siphons. SKULL: No skull fracture. No destructive bony lesions with particular attention to LEFT frontoparietal convexity. No significant scalp soft tissue swelling. SINUSES/ORBITS: The mastoid air-cells and included paranasal sinuses are well-aerated.The included ocular globes and orbital contents are non-suspicious. OTHER: None. IMPRESSION: 1. 2.8 x 3.5 x 3.5 cm LEFT frontoparietal mass concerning for metastatic disease given history of lung cancer, less likely primary glial neoplasm. Regional mass effect without midline shift. 2. Moderate chronic small vessel ischemic disease. Electronically Signed   By: CElon AlasM.D.   On: 12/29/2016  17:55   Ct Chest W Contrast  Result Date: 12/31/2016 CLINICAL DATA:  Status post resection of left brain mass. Evaluate for metastatic disease. EXAM: CT CHEST, ABDOMEN, AND PELVIS WITH CONTRAST TECHNIQUE: Multidetector CT imaging of the chest, abdomen and pelvis was performed following the standard protocol during bolus administration of intravenous  contrast. CONTRAST:  180m ISOVUE-300 IOPAMIDOL (ISOVUE-300) INJECTION 61% COMPARISON:  CT head 12/29/2016.  MRI brain 12/30/2016. FINDINGS: CT CHEST FINDINGS Cardiovascular: There is atherosclerosis of the aorta, great vessels and coronary arteries. No acute vascular findings are demonstrated. The heart size is normal. There is no pericardial effusion. Mediastinum/Nodes: There are no enlarged mediastinal, hilar or axillary lymph nodes. 15 x 11 mm left thyroid nodule noted on image 3. There is a moderate size hiatal hernia. Lungs/Pleura: There is no pleural effusion. Status post right upper lobe resection. Underlying emphysema noted. There is atelectasis medially in the left lower lobe. No evidence of pulmonary nodule, endobronchial lesion or confluent airspace opacity. Musculoskeletal/Chest wall: No chest wall mass or suspicious osseous findings. Right-sided thoracotomy defects noted. CT ABDOMEN AND PELVIS FINDINGS Hepatobiliary: The liver is normal in density without focal abnormality. Dependent high density within the gallbladder lumen, likely a combination of sludge and stones. No gallbladder wall thickening, surrounding inflammation or biliary ductal dilatation. Pancreas: There are multiple small calcifications throughout the pancreatic parenchyma, likely due to chronic pancreatitis. No pancreatic mass, ductal dilatation or surrounding inflammation. Spleen: Normal in size without focal abnormality. Adrenals/Urinary Tract: Both adrenal glands appear normal. Both kidneys appear normal. No evidence of renal mass, hydronephrosis or urinary tract calculus. There is  air in the bladder lumen, likely iatrogenic. No bladder wall thickening seen. Stomach/Bowel: No evidence of bowel wall thickening, distention or surrounding inflammatory change. Mild diverticular changes throughout the distal colon. As above, moderate-size hiatal hernia. Vascular/Lymphatic: There are no enlarged abdominal or pelvic lymph nodes. Aortic and branch vessel atherosclerosis. Reproductive: Hysterectomy.  No evidence of adnexal mass. Other: No evidence of abdominal wall mass or hernia. No ascites. Musculoskeletal: No acute or significant osseous findings. There are mild degenerative changes throughout the spine. IMPRESSION: 1. No evidence of metastatic disease within the chest, abdomen or pelvis. No primary malignancy identified. 2. Previous right upper lobe resection. Underlying emphysema and left lower lobe atelectasis noted. 3. Left thyroid nodule, indeterminate in etiology, but of doubtful clinical significance. Consider ultrasound correlation. 4.  Aortic Atherosclerosis (ICD10-I70.0). 5. Moderate-size hiatal hernia. 6. Sequela of chronic pancreatitis. Electronically Signed   By: WRichardean SaleM.D.   On: 12/31/2016 20:20   Mr BJeri CosWGEContrast  Result Date: 12/30/2016 CLINICAL DATA:  76y/o  F; EXAM: MRI HEAD WITHOUT AND WITH CONTRAST TECHNIQUE: Multiplanar, multiecho pulse sequences of the brain and surrounding structures were obtained without and with intravenous contrast. CONTRAST:  138mMULTIHANCE GADOBENATE DIMEGLUMINE 529 MG/ML IV SOLN COMPARISON:  None. FINDINGS: Brain: Resection cavity within the left frontoparietal junction containing blood products which demonstrate T1 shortening and susceptibility blooming. Faint reduced diffusion of the cavity margins is likely related to the presence of blood products. No findings of brain parenchymal infarction. At the anterior cavity margin there is an 8 x 13 x 14 mm focus of enhancement (series 14, image 14 and series 12, image 43). No additional  focus of enhancement is identified within the brain. T2 FLAIR signal abnormality surrounding the cavity is decreased from the preoperative MRI compatible with interval decrease in edema. Mass effect associated with the resection cavity and edema partially effaces the left lateral ventricle, there is no midline shift or herniation. Postsurgical changes related to the left frontoparietal craniotomy include subjacent dural enhancement and a thin postoperative extra-axial collection. There are skin staples in the overlying scalp and mild scalp edema. There is a small volume of pneumocephalus collected predominantly over the left frontal lobe. Stable  background of mild chronic microvascular ischemic changes and parenchymal volume loss of the brain for age. Vascular: Normal flow voids. Skull and upper cervical spine: As above. Sinuses/Orbits: Negative. Other: None. IMPRESSION: 1. Expected postsurgical changes related to left frontoparietal junction region tumor resection and craniotomy. 2. Nodular focus of enhancement at the anterior cavity margin measuring up to 14 mm may represent residual neoplasm, attention at follow-up is recommended. 3. No new distant metastasis identified. Electronically Signed   By: Kristine Garbe M.D.   On: 12/30/2016 22:59   Ct Abdomen Pelvis W Contrast  Result Date: 12/31/2016 CLINICAL DATA:  Status post resection of left brain mass. Evaluate for metastatic disease. EXAM: CT CHEST, ABDOMEN, AND PELVIS WITH CONTRAST TECHNIQUE: Multidetector CT imaging of the chest, abdomen and pelvis was performed following the standard protocol during bolus administration of intravenous contrast. CONTRAST:  134m ISOVUE-300 IOPAMIDOL (ISOVUE-300) INJECTION 61% COMPARISON:  CT head 12/29/2016.  MRI brain 12/30/2016. FINDINGS: CT CHEST FINDINGS Cardiovascular: There is atherosclerosis of the aorta, great vessels and coronary arteries. No acute vascular findings are demonstrated. The heart size is  normal. There is no pericardial effusion. Mediastinum/Nodes: There are no enlarged mediastinal, hilar or axillary lymph nodes. 15 x 11 mm left thyroid nodule noted on image 3. There is a moderate size hiatal hernia. Lungs/Pleura: There is no pleural effusion. Status post right upper lobe resection. Underlying emphysema noted. There is atelectasis medially in the left lower lobe. No evidence of pulmonary nodule, endobronchial lesion or confluent airspace opacity. Musculoskeletal/Chest wall: No chest wall mass or suspicious osseous findings. Right-sided thoracotomy defects noted. CT ABDOMEN AND PELVIS FINDINGS Hepatobiliary: The liver is normal in density without focal abnormality. Dependent high density within the gallbladder lumen, likely a combination of sludge and stones. No gallbladder wall thickening, surrounding inflammation or biliary ductal dilatation. Pancreas: There are multiple small calcifications throughout the pancreatic parenchyma, likely due to chronic pancreatitis. No pancreatic mass, ductal dilatation or surrounding inflammation. Spleen: Normal in size without focal abnormality. Adrenals/Urinary Tract: Both adrenal glands appear normal. Both kidneys appear normal. No evidence of renal mass, hydronephrosis or urinary tract calculus. There is air in the bladder lumen, likely iatrogenic. No bladder wall thickening seen. Stomach/Bowel: No evidence of bowel wall thickening, distention or surrounding inflammatory change. Mild diverticular changes throughout the distal colon. As above, moderate-size hiatal hernia. Vascular/Lymphatic: There are no enlarged abdominal or pelvic lymph nodes. Aortic and branch vessel atherosclerosis. Reproductive: Hysterectomy.  No evidence of adnexal mass. Other: No evidence of abdominal wall mass or hernia. No ascites. Musculoskeletal: No acute or significant osseous findings. There are mild degenerative changes throughout the spine. IMPRESSION: 1. No evidence of metastatic  disease within the chest, abdomen or pelvis. No primary malignancy identified. 2. Previous right upper lobe resection. Underlying emphysema and left lower lobe atelectasis noted. 3. Left thyroid nodule, indeterminate in etiology, but of doubtful clinical significance. Consider ultrasound correlation. 4.  Aortic Atherosclerosis (ICD10-I70.0). 5. Moderate-size hiatal hernia. 6. Sequela of chronic pancreatitis. Electronically Signed   By: WRichardean SaleM.D.   On: 12/31/2016 20:20   Ir Ivc Filter Plmt / S&i /img Guid/mod Sed  Result Date: 01/10/2017 INDICATION: 76year old female with history of DVT. 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 ANESTHESIA/SEDATION: 1.0 mg IV Versed; 50 mcg IV Fentanyl Moderate Sedation Time:  25 minutes The patient was continuously monitored during the procedure by the interventional radiology nurse under  my direct supervision. FLUOROSCOPY TIME:  Fluoroscopy Time: 3 minutes 54 seconds (21.4 mGy). COMPLICATIONS: None PROCEDURE: The procedure, risks, benefits, and alternatives were explained to the patient. Specific risks discussed include bleeding, infection, contrast reaction, renal failure, IVC filter fracture, migration, ileo caval thrombus (3% incidence), need for further procedure, need for further surgery, pulmonary embolism, cardiopulmonary collapse, death. Questions regarding the procedure were encouraged and answered. The patient understands and consents to the procedure. Ultrasound survey was performed with images stored and sent to PACs. The neck was prepped with Betadine in a sterile fashion, and a sterile drape was applied covering the operative field. A sterile gown and sterile gloves were used for the procedure. Local anesthesia was provided with 1% Lidocaine. A micropuncture needle was used access the right internal jugular vein under  ultrasound. With excellent venous blood flow returned, and an .018 micro wire was passed through the needle. Small incision was made with an 11 blade scalpel. The needle was removed, and a micropuncture sheath was placed over the wire. The inner dilator and wire were removed, and an 035 Bentson wire was advanced under fluoroscopy into the IVC. Serial dilation of the soft tissue tract was performed with an 8 [place] dilator and subsequently a 10 [place] dilator. The delivery sheath for a retrievable Bard Denali filter was passed over the Bentson wire into the IVC. The wire was removed and small contrast was used to confirm IVC location. IVC cavagram performed. Dilator was removed, and the IVC filter was then delivered, with proximal migration such that the wall contact encroached upon the inflow from the right renal vein. A Bard retrieval kit was then passed over the guidewire for repositioning. The cap of the filter was easily captured with the gooseneck snare, and the filter was repositioned to a slightly lower position at the L3 level. Final position below the lowest renal vein. Repeat cavagram performed, and the catheter/wire/sheath was removed. Manual pressure was used for hemostasis. Patient tolerated the procedure well and remained hemodynamically stable throughout. No complications were encountered and no significant blood loss was encounter. IMPRESSION: Status post IVC filter placement via right IJ approach. Signed, Dulcy Fanny. Earleen Newport DO [facility] PLAN: This IVC filter is potentially retrievable. The patient can be assessed for filter retrieval by Interventional Radiology in approximately 8-12 weeks, or if the patient is a candidate for anti coagulation. Further recommendations regarding filter retrieval, continued surveillance or declaration of device permanence, will be made at that time. Electronically Signed   By: Corrie Mckusick D.O.   On:  01/10/2017 10:18   Dg Foot Complete Right  Result Date: 01/22/2017 CLINICAL DATA:  Injury EXAM: RIGHT FOOT COMPLETE - 3+ VIEW COMPARISON:  None. FINDINGS: Marked hallux valgus deformity.  Negative for fracture or erosion. IMPRESSION: No acute abnormality. Electronically Signed   By: Franchot Gallo M.D.   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 ANESTHESIA/SEDATION: 1.0 mg IV Versed; 50 mcg IV Fentanyl Moderate Sedation Time:  25 minutes The patient was continuously monitored during the procedure by the interventional radiology nurse under my direct supervision. FLUOROSCOPY TIME:  Fluoroscopy Time: 3 minutes 54 seconds (21.4 mGy). COMPLICATIONS: None PROCEDURE: The procedure, risks, benefits, and alternatives were explained to the patient.  Specific risks discussed include bleeding, infection, contrast reaction, renal failure, IVC filter fracture, migration, ileo caval thrombus (3% incidence), need for further procedure, need for further surgery, pulmonary embolism, cardiopulmonary collapse, death. Questions regarding the procedure were encouraged and answered. The patient understands and consents to the procedure. Ultrasound survey was performed with images stored and sent to PACs. The neck was prepped with Betadine in a sterile fashion, and a sterile drape was applied covering the operative field. A sterile gown and sterile gloves were used for the procedure. Local anesthesia was provided with 1% Lidocaine. A micropuncture needle was used access the right internal jugular vein under ultrasound. With excellent venous blood flow returned, and an .018 micro wire was passed through the needle. Small incision was made with an 11  blade scalpel. The needle was removed, and a micropuncture sheath was placed over the wire. The inner dilator and wire were removed, and an 035 Bentson wire was advanced under fluoroscopy into the IVC. Serial dilation of the soft tissue tract was performed with an 8 [place] dilator and subsequently a 10 [place] dilator. The delivery sheath for a retrievable Bard Denali filter was passed over the Bentson wire into the IVC. The wire was removed and small contrast was used to confirm IVC location. IVC cavagram performed. Dilator was removed, and the IVC filter was then delivered, with proximal migration such that the wall contact encroached upon the inflow from the right renal vein. A Bard retrieval kit was then passed over the guidewire for repositioning. The cap of the filter was easily captured with the gooseneck snare, and the filter was repositioned to a slightly lower position at the L3 level. Final position below the lowest renal vein. Repeat cavagram performed, and the catheter/wire/sheath was removed. Manual pressure was used for hemostasis. Patient tolerated the procedure well and remained hemodynamically stable throughout. No complications were encountered and no significant blood loss was encounter. IMPRESSION: Status post IVC filter placement via right IJ approach. Signed, Dulcy Fanny. Earleen Newport DO [facility] PLAN: This IVC filter is potentially retrievable. The patient can be assessed for filter retrieval by Interventional Radiology in approximately 8-12 weeks, or if the patient is a candidate for anti coagulation. Further recommendations regarding filter retrieval, continued surveillance or declaration of device permanence, will be made at that time. Electronically Signed   By: Corrie Mckusick D.O.   On: 01/10/2017 10:18       IMPRESSION/PLAN: 1. 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 I spent time today with the patient and her husband as well as extended  family reviewing her case and discussing the findings so far. She is counseled on the role of [place], and we would like to proceed with 3T MRI Friday of this week or Monday of next so that we can have this for planning purposes within 2 weeks of her treatment which is currently scheduled for [facility]/10/17 at [facility]. We also discussed the need to simulate and make a mask for treatment which we will plan for next week also.  We discussed the risks, benefits, short, and long term effects of radiotherapy, and the patient is interested in proceeding. We will begin the process of planning out her treatment and anticipate that if she's still inpatient that care link will be utilized for transportation back and forth from our department at [facility]. We will review formal consent at her simulation appointment. 2. Claustrophobia. The patient will be given a prescription outpatient for Ativan to  take 30 minutes prior to MRIs or Radiotherapy. I will also include this in her inpt orders.   In a visit lasting 70 minutes, greater than 50% of the time was spent face to face discussing her case and in floor time coordinating the patient's care.    Carola Rhine, PAC

## 2017-01-26 NOTE — Progress Notes (Signed)
[facility] PHYSICAL MEDICINE & REHABILITATION     PROGRESS NOTE    Subjective/Complaints: Sitting up in bed. Feels better than yesterday but right arm and leg remain profoundly weak. Denies pain. Has remained up beat  ROS: pt denies nausea, vomiting, diarrhea, cough, shortness of breath or chest pain   Objective: Vital Signs: Blood pressure (!) 113/43, pulse 66, temperature 97.8 F (36.6 C), temperature source Oral, resp. rate 17, height 5\' 5"  (1.651 m), weight 78 kg (172 lb), SpO2 95 %. No results found.  Recent Labs  01/24/17 2155  WBC 5.1  HGB 11.7*  HCT 36.0  PLT 249    Recent Labs  01/24/17 2155  NA 138  K 4.3  CL 101  GLUCOSE 200*  BUN 19  CREATININE 1.00  CALCIUM 9.1   CBG (last 3)  No results for input(s): GLUCAP in the last 72 hours.  Wt Readings from Last 3 Encounters:  01/26/17 78 kg (172 lb)  01/10/17 81.8 kg (180 lb 6.4 oz)  01/09/17 77.7 kg (171 lb 3.2 oz)    Physical Exam:  Constitutional: She is oriented to person, place, and time. She appears well-developedand well-nourished. No distress but does look a little fatigued.  HENT:  Head: Normocephalicand atraumatic.  Eyes: EOMI. No discharge.  Neck: Normal range of motion. Neck supple.  Cardiovascular: RRR without murmur. No JVD    . Respiratory: CTA Bilaterally without wheezes or rales. Normal effort     GI: BS +, non-tender, non-distended   Musculoskeletal: She exhibits trace RLE edema --stable Neurological: She is alertand oriented to person, place, and time.  Speech more fluid and automatic today Decreased LT right leg.    RUE   1-2+/5 deltoid, biceps, triceps, wrist, HI. RLE: 1 to 1+/5 HAD, 0-tr distally   Skin: Skin is warmand dry.   Psychiatric: up beat and smiling. .   Assessment/Plan: 1. 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 Physiatrist is providing close team supervision and 24 hour management of active medical problems listed below. Physiatrist and rehab team continue to assess barriers to discharge/monitor patient progress toward functional and medical goals.  Function:  Bathing Bathing position   Position: Wheelchair/chair at sink  Bathing parts Body parts bathed by patient: Chest, Abdomen, Front perineal area, Right upper leg, Left upper leg Body parts bathed by helper: Right arm, Left arm, Buttocks, Right lower leg, Left lower leg, Back  Bathing assist Assist Level:  (Mod A)      Upper Body Dressing/Undressing Upper body dressing   What is the patient wearing?: Pull over shirt/dress     Pull over shirt/dress - Perfomed by patient: Thread/unthread left sleeve, Put head through opening Pull over shirt/dress - Perfomed by helper: Thread/unthread right sleeve, Pull shirt over trunk        Upper body assist Assist Level:  (Mod A)   Set up : To obtain clothing/put away  Lower Body Dressing/Undressing Lower body dressing   What is the patient wearing?: Non-skid slipper socks, Underwear, Pants Underwear - Performed by patient: Thread/unthread left underwear leg Underwear - Performed by helper: Thread/unthread right underwear leg, Pull underwear up/down Pants- Performed by patient: Thread/unthread left pants leg Pants- Performed by helper: Thread/unthread right pants leg, Pull pants up/down Non-skid slipper socks- Performed by patient: Don/doff right sock, Don/doff left sock Non-skid slipper socks- Performed by helper: Don/doff right sock,  Don/doff left sock               TED Hose - Performed by helper:  (pt refusing Teds/ace wraps)  Lower body assist Assist for lower body dressing:  (Max A sit<stand in [place])      Toileting Toileting   Toileting steps completed by patient: Adjust clothing prior to toileting, Adjust clothing after toileting Toileting steps completed by helper:  Adjust clothing prior to toileting, Performs perineal hygiene, Adjust clothing after toileting Toileting Assistive Devices: Other (comment)  Toileting assist Assist level: Two helpers   Transfers Chair/bed transfer   Chair/bed transfer method: Stand pivot Chair/bed transfer assist level: Maximal assist (Pt 25 - 49%/lift and lower) Chair/bed transfer assistive device: Armrests Mechanical lift: [name]   Locomotion Ambulation     Max distance: 12' Assist level: Touching or steadying assistance (Pt > 75%)   Wheelchair   Type: Manual Max wheelchair distance: 15' Assist Level: Moderate assistance (Pt 50 - 74%)  Cognition Comprehension Comprehension assist level: Follows complex conversation/direction with extra time/assistive device  Expression Expression assist level: Expresses complex ideas: With extra time/assistive device  Social Interaction Social Interaction assist level: Interacts appropriately with others - No medications needed.  Problem Solving Problem solving assist level: Solves complex 90% of the time/cues < 10% of the time  Memory Memory assist level: More than reasonable amount of time   Medical Problem List and Plan: 1. Functional deficits and right hemiparesissecondary to metastatic lung cancer to the brain. -CT reviewed :increased edema at site of residual tumor, left-fronto-parietal area, substantial worsening Right HP, aphasic       -continue decadron iv 8mg  q6     -contacted and spoke with Dr. Cyndy Freeze who agrees with plan. He has not spoken with family/pt yet  -Oncology to see pt today re: plan, involvement of rad-onc, etc            -may continue with therapies as tolerated.   -funcitonal goals adjusted given neurological set back. ' 2. DVT left gastroc: Mechanical: Sequential compression devices, below knee Bilateral lower extremities, IVC filter in place.   -discussed elevation of legs when resting, compression stockings, etc  3. Headaches/Pain  Management: Headaches persistent and being managed with prn hydocodone.  4. Mood: LCSW to follow for evaluation and support.  5. Neuropsych: This patient iscapable of making decisions on herown behalf. 6. Skin/Wound Care: routine pressure relief measures.  --staples out 7. Fluids/Electrolytes/Nutrition/dysphagia: Monitor I/O. BUN   normal 13  -SLP to follow up re: safety with diet  -D2 thins currently  -potassium 3.8 on 10/18---continue supplement---recheck this week 8. Nausea: Likely central. Continue full liquid diet. Zofran not effective--continue promethizine prn.  9. H/o A Fib: Managed with diltiazem. Monitor HR bid. HR controlled 10/19 Vitals:   01/25/17 1958 01/26/17 0430  BP:  (!) 113/43  Pulse:  66  Resp:  17  Temp:  97.8 F (36.6 C)  SpO2: 96% 95%   10. Emphysema/Bronchiectasis: continue nebulizers.  chest PT bid as at home. Continue oxygen at nights and prn during the day.  11. Leucocytosis: resolved   -likely steroid related   -6.4 10/15 12. Constipation:  Moving bowels    -use prn miralax  -encourage fruits/fiber  -encourage PO fluids 13.  GERD-  Protonix  Increased to 40mg  bid with improvement   LOS (Days) 16 A FACE TO FACE EVALUATION WAS PERFORMED  Meredith Staggers, MD 01/26/2017 9:19 AM

## 2017-01-26 NOTE — Progress Notes (Signed)
Patient ID: Tracy Bridges, female   DOB: 13-Dec-1940, 76 y.o.   MRN: [id number] BP (!) 113/43 (BP Location: Left Arm)   Pulse 66   Temp 97.8 F (36.6 C) (Oral)   Resp 17   Ht 5\' 5"  (1.651 m)   Wt 78 kg (172 lb)   SpO2 95%   BMI 28.62 kg/m  Spoke with patient today. Has edema around the resection site, no midline shift.  Awaiting radiation, no neurosurgical issues. No blood, no new issues. Expected evolution of resection cavity.  Decadron has been increased.  I have called rad onc to find out why there has been no radiation.

## 2017-01-26 NOTE — Progress Notes (Signed)
Occupational Therapy Session Note  Patient Details  Name: Tracy Bridges MRN: [id number] Date of Birth: 1941/01/03  Today's Date: 01/26/2017 OT Individual Time: 1400-1430 OT Individual Time Calculation (min): 30 min   Short Term Goals: Week 2:  OT Short Term Goal 1 (Week 2): Pt will complete UB dressing with Min A OT Short Term Goal 2 (Week 2): Pt will consistently complete sit<stand during LB dressing with Mod A OT Short Term Goal 3 (Week 2): Pt will complete 1/3 components of toileting   Skilled Therapeutic Interventions/Progress Updates:    Pt greeted sitting in wc with family present. Pt reports fatigue after recently finishing PT session, but agreeable to OT. Treatment session focused on general strengthening activities from wc level. R LE there-ex of hip flexion, knee extension, ankle pumps, hip abduction and hip adduction 10x3. Pt able to initiate each exercises but needed OT assist to bring through full ROM against gravity. Trace activation with hip abduction/adduction, requiring max/total A. Discussed OT goals with pt and family, then left pt seated in wc with needs met and family present.   Therapy Documentation Precautions:  Precautions Precautions: Fall Restrictions Weight Bearing Restrictions: No Pain: Pain Assessment Pain Assessment: No/denies pain Pain Score: 0-No pain  See Function Navigator for Current Functional Status.   Therapy/Group: Individual Therapy  Valma Cava 01/26/2017, 3:12 PM

## 2017-01-26 NOTE — Progress Notes (Signed)
[place] [phone]   [phone]  CONSULT NOTE  REFERRING PHYSICIAN: Dr. Alger Simons  REASON FOR CONSULTATION:  76 years old white female with metastatic lung cancer.  HPI Tracy Bridges is a 76 y.o. female with past medical history significant for atrial fibrillation, COPD as well as history of smoking but quit more than 35 years ago. The patient mentioned that she was diagnosed with a stage IB (T2, N0, M0) non-small cell lung cancer status post right upper lobectomy at [facility] under the care of Dr. Wynelle Cleveland in August of 1999. She was on observation for almost 10 years before she was discharged. She did not have any evidence of disease recurrence during this period. The patient was admitted to [facility] on 12/26/2016 com several weeks of right sided numbness and weakness as well as jerking movement in the right arm. CT scan of the head followed by MRI of the brain on 12/26/2016 showed enhancing mass lesion in the high left frontal parietal lobe measuring 3.8 x 2.8 x 4.2 cm. The mass with surrounding by vasogenic edema. It was suspicious for meningioma but metastatic disease was possible. On 12/31/2016 she underwent left frontal craniotomy with resection of the brain tumor. He final pathology ([id number]) was consistent with metastatic carcinoma favoring lung primary. PDL 1 expression was 0%. She was transferred to the inpatient rehabilitation and has been undergoing physical therapy. He was on a tapered dose of Decadron. She was noted few days ago to have increase in the right sided weakness again. Repeat CT scan of the head without contrast on 01/23/2017 showed sequela of the prior left parietal craniotomy for tumor resection suspected residual tumor at the left frontoparietal junction with worsened localized vasogenic edema as compared to the previous MRI. Here was associated regional mass effect without midline shift. Her dose of Decadron  was increased to 8 mg IV every 6 hours. Repeat CT scan of the chest, abdomen and pelvis showed no evidence of metastatic disease in the chest, abdomen and pelvis. Dr. Naaman Plummer kindly asked me to see the patient today for evaluation and recommendation regarding her condition. When seen today the patient is feeling much better. She continues to have weakness in the right lower extremities. She was accompanied by her husband, sister and niece. She denied having any chest pain, shortness of breath, cough or hemoptysis. Family history significant for mother with lung cancer. The patient is married and has 2 children. She has remote history of smoking but quit 35 years ago. She has no history of 5 core drug abuse.  HPI  Past Medical History:  Diagnosis Date  . Asthma   . Atrial fibrillation ([place])   . Emphysema/COPD ([place])   . Lung cancer [facility])     Past Surgical History:  Procedure Laterality Date  . ABDOMINAL HYSTERECTOMY    . APPLICATION OF CRANIAL NAVIGATION Left 12/30/2016   Procedure: APPLICATION OF CRANIAL NAVIGATION;  Surgeon: Ashok Pall, MD;  Location: [place];  Service: Neurosurgery;  Laterality: Left;  APPLICATION OF CRANIAL NAVIGATION  . CESAREAN SECTION    . CRANIOTOMY Left 12/30/2016   Procedure: CRANIOTOMY TUMOR EXCISION;  Surgeon: Ashok Pall, MD;  Location: [place];  Service: Neurosurgery;  Laterality: Left;  CRANIOTOMY TUMOR EXCISION  . HERNIA REPAIR    . IR IVC FILTER PLMT / S&I /IMG GUID/MOD SED  01/10/2017  . IR IVC FILTER REPOSITION / S&I /IMG GUID/MOD SED  01/10/2017  . LUNG REMOVAL, PARTIAL  04/1997    Family History  Problem Relation Age of Onset  . Liver cancer Other   . Heart disease Other   . Brain cancer Neg Hx     Social History Social History  Substance Use Topics  . Smoking status: Former Smoker    Types: Cigarettes    Quit date: 62  . Smokeless tobacco: Never Used  . Alcohol use No    Allergies  Allergen Reactions  . Aspirin Shortness Of Breath    . Atenolol Swelling    Throat swelled and closed up  . Boniva [Ibandronic Acid] Other (See Comments)    Pt does not remember specific reaction  . Fluticasone Anaphylaxis and Hives  . Fosamax [Alendronate Sodium] Other (See Comments)    Pt does not remember specific reaction  . Latex Swelling and Rash    Throat swelled and closed up  . Mucinex [Guaifenesin Er] Anaphylaxis  . Rocephin [Ceftriaxone Sodium In Dextrose] Anaphylaxis and Rash  . Rotateq [Rotavirus Vaccine Live Oral] Other (See Comments)    "poorly tolerated"  . Sulfa Antibiotics Other (See Comments)    Pt does not remember specific reaction  . Vancomycin Other (See Comments)    Pt does not remember specific reaction  . Senokot S [Sennosides-Docusate Sodium]     Throat swelling  . Penicillins Rash    Childhood reaction Has patient had a PCN reaction causing immediate rash, facial/tongue/throat swelling, SOB or lightheadedness with hypotension: Yes Has patient had a PCN reaction causing severe rash involving mucus membranes or skin necrosis: No Has patient had a PCN reaction that required hospitalization: Unknown Has patient had a PCN reaction occurring within the last 10 years: No If all of the above answers are "NO", then may proceed with Cephalosporin use.  . Tobramycin Sulfate Rash    Current Facility-Administered Medications  Medication Dose Route Frequency Provider Last Rate Last Dose  . acetaminophen (TYLENOL) tablet 325-650 mg  325-650 mg Oral Q4H PRN Love, Pamela S, PA-C      . alum & mag hydroxide-simeth (MAALOX/MYLANTA) 200-200-20 MG/5ML suspension 30 mL  30 mL Oral Q4H PRN Love, Pamela S, PA-C      . arformoterol (BROVANA) nebulizer solution 15 mcg  15 mcg Nebulization BID Reesa Chew S, PA-C   15 mcg at 01/26/17 1034  . bisacodyl (DULCOLAX) suppository 10 mg  10 mg Rectal Daily PRN Bary Leriche, PA-C   10 mg at 01/24/17 2205  . dexamethasone (DECADRON) injection 8 mg  8 mg Intravenous Q6H Meredith Staggers, MD   8 mg at 01/26/17 1206  . diltiazem (CARDIZEM CD) 24 hr capsule 240 mg  240 mg Oral QHS Bary Leriche, PA-C   240 mg at 01/25/17 2105  . diphenhydrAMINE (BENADRYL) 12.5 MG/5ML elixir 12.5-25 mg  12.5-25 mg Oral Q6H PRN Love, Pamela S, PA-C      . furosemide (LASIX) tablet 20 mg  20 mg Oral Daily PRN Bary Leriche, PA-C   20 mg at 01/24/17 1049  . Gerhardt's butt cream   Topical TID PRN Angiulli, Lavon Paganini, PA-C      . ipratropium-albuterol (DUONEB) 0.5-2.5 (3) MG/3ML nebulizer solution 3 mL  3 mL Nebulization Q4H PRN Meredith Staggers, MD   3 mL at 01/21/17 1953  . levETIRAcetam (KEPPRA) tablet 500 mg  500 mg Oral BID Bary Leriche, PA-C   500 mg at 01/26/17 0825  . LORazepam (ATIVAN) injection 1-2 mg  1-2 mg Intravenous Q6H PRN Love,  Ivan Anchors, PA-C      . methocarbamol (ROBAXIN) tablet 500 mg  500 mg Oral Q8H PRN Jamse Arn, MD      . nystatin (MYCOSTATIN/NYSTOP) topical powder   Topical TID Love, Pamela S, PA-C      . pantoprazole (PROTONIX) EC tablet 40 mg  40 mg Oral BID Charlett Blake, MD   40 mg at 01/26/17 4098  . potassium chloride 20 MEQ/15ML (10%) solution 20 mEq  20 mEq Oral Daily Meredith Staggers, MD   20 mEq at 01/26/17 0825  . prochlorperazine (COMPAZINE) tablet 5-10 mg  5-10 mg Oral Q6H PRN Bary Leriche, PA-C   10 mg at 01/13/17 1191   Or  . prochlorperazine (COMPAZINE) injection 5-10 mg  5-10 mg Intramuscular Q6H PRN Love, Pamela S, PA-C       Or  . prochlorperazine (COMPAZINE) suppository 12.5 mg  12.5 mg Rectal Q6H PRN Love, Pamela S, PA-C      . sodium chloride flush (NS) 0.9 % injection 10-40 mL  10-40 mL Intracatheter PRN Meredith Staggers, MD   10 mL at 01/25/17 1601  . sodium phosphate (FLEET) 7-19 GM/118ML enema 1 enema  1 enema Rectal Once PRN Love, Pamela S, PA-C      . traZODone (DESYREL) tablet 25-50 mg  25-50 mg Oral QHS PRN Love, Ivan Anchors, PA-C        Review of Systems  Constitutional: positive for fatigue Eyes: negative Ears, nose,  mouth, throat, and face: negative Respiratory: negative Cardiovascular: negative Gastrointestinal: negative Genitourinary:negative Integument/breast: negative Hematologic/lymphatic: negative Musculoskeletal:negative Neurological: positive for weakness Behavioral/Psych: negative Endocrine: negative Allergic/Immunologic: negative  Physical Exam  YNW:GNFAO, healthy, no distress, well nourished and well developed SKIN: skin color, texture, turgor are normal, no rashes or significant lesions HEAD: Normocephalic, No masses, lesions, tenderness or abnormalities EYES: normal, PERRLA, Conjunctiva are pink and non-injected EARS: External ears normal, Canals clear OROPHARYNX:no exudate, no erythema and lips, buccal mucosa, and tongue normal  NECK: supple, no adenopathy, no JVD LYMPH:  no palpable lymphadenopathy, no hepatosplenomegaly BREAST:not examined LUNGS: clear to auscultation , and palpation HEART: regular rate & rhythm, no murmurs and no gallops ABDOMEN:abdomen soft, non-tender, normal bowel sounds and no masses or organomegaly BACK: Back symmetric, no curvature., No CVA tenderness EXTREMITIES:no joint deformities, effusion, or inflammation, no edema, no skin discoloration  NEURO: alert & oriented x 3 with fluent speech, no focal motor/sensory deficits  PERFORMANCE STATUS: ECOG 1  LABORATORY DATA: Lab Results  Component Value Date   WBC 5.1 01/24/2017   HGB 11.7 (L) 01/24/2017   HCT 36.0 01/24/2017   MCV 93.5 01/24/2017   PLT 249 01/24/2017    _0 @  RADIOGRAPHIC STUDIES: Ct Head Wo Contrast  Result Date: 01/23/2017 CLINICAL DATA:  Initial evaluation for generalized weakness. History of metastatic lung cancer. EXAM: CT HEAD WITHOUT CONTRAST TECHNIQUE: Contiguous axial images were obtained from the base of the skull through the vertex without intravenous contrast. COMPARISON:  Prior MRI from 12/30/2016. FINDINGS: Brain: Postoperative changes from prior left parietal  craniotomy for tumor excision. Residual tumor or at the left frontal parietal junction is present, measuring approximately 2.9 cm (series 3, image 24). Exact measurements difficult on this noncontrast examination. Associated vasogenic edema with regional mass effect is increased relative to previous MRI. No significant midline shift. No hydrocephalus or ventricular trapping. No other appreciable metastatic lesions. No acute intracranial hemorrhage. No evidence for acute large vessel territory infarct. No extra-axial fluid collection. Vascular: No hyperdense  vessel. Scattered vascular calcifications noted within the carotid siphons. Skull: Sequelae of prior craniotomy at the left parietal calvarium. Overlying skin staples noted. Sinuses/Orbits: Globes and orbital soft tissues within normal limits. Paranasal sinuses are largely clear. No mastoid effusion. Other: None. IMPRESSION: 1. Sequelae of prior left parietal craniotomy for tumor resection. Suspected residual tumor at the left frontoparietal junction with worsened localized vasogenic edema as compared to previous MRI. Associated regional mass effect without midline shift. 2. No other acute intracranial process identified. Electronically Signed   By: Jeannine Boga M.D.   On: 01/23/2017 23:50   Ct Head Wo Contrast  Result Date: 01/01/2017 CLINICAL DATA:  Nausea and vertigo. Postop tumor resection 12/30/2016. EXAM: CT HEAD WITHOUT CONTRAST TECHNIQUE: Contiguous axial images were obtained from the base of the skull through the vertex without intravenous contrast. COMPARISON:  Brain MRI 12/30/2016 FINDINGS: Brain: Postoperative changes are again seen in the left frontoparietal region near the vertex related to recent tumor resection. Small volume blood products are again seen at the resection site, not grossly increased compared to the MRI. Mild pneumocephalus is noted. Surrounding vasogenic edema has not significantly changed. Patchy hypodensities  elsewhere in the cerebral white matter bilaterally are nonspecific but compatible with moderate chronic small vessel ischemic disease. There is no evidence of acute large territory infarct, new intracranial hemorrhage, or midline shift. A very small extra-axial hematoma is noted subjacent to the craniotomy, similar to the MRI and without significant mass effect. There is mild cerebral atrophy. Vascular: Calcified atherosclerosis at the skullbase. No hyperdense vessel. Skull: Left frontoparietal craniotomy.  Skin staples in place. Sinuses/Orbits: Bilateral cataract extraction. Prior functional sinus surgery. Clear mastoid air cells. Other: None. IMPRESSION: 1. Similar appearance of postoperative changes in the left frontoparietal region. 2. No evidence of acute intracranial abnormality. Electronically Signed   By: Logan Bores M.D.   On: 01/01/2017 10:50   [facility] Ct Head W Contrast  Result Date: 12/29/2016 CLINICAL DATA:  Pre-surgical planning for solitary frontoparietal mass. History of lung cancer. EXAM: CT HEAD WITH CONTRAST TECHNIQUE: Contiguous axial images were obtained from the base of the skull through the vertex with intravenous contrast. CONTRAST:  17m ISOVUE-300 IOPAMIDOL (ISOVUE-300) INJECTION 61% COMPARISON:  CT HEAD and MRI head December 26, 2016 FINDINGS: BRAIN: 2.8 x 3.5 x 3.5 cm (transverse by AP by CC) heterogeneously enhancing intraparenchymal LEFT frontoparietal mass involving gray-white matter again noted with surrounding low-density vasogenic edema and regional mass effect. Mass does not extend to superior sagittal sinus. No dural tail. No midline shift. No hydrocephalus. No acute large vascular territory infarcts. Patchy supratentorial white matter hypodensities exclusive of the aforementioned abnormality. Limited assessment for hemorrhage on this postcontrast examination. No abnormal extra-axial fluid collections. VASCULAR: Moderate calcific atherosclerosis of the carotid siphons.  SKULL: No skull fracture. No destructive bony lesions with particular attention to LEFT frontoparietal convexity. No significant scalp soft tissue swelling. SINUSES/ORBITS: The mastoid air-cells and included paranasal sinuses are well-aerated.The included ocular globes and orbital contents are non-suspicious. OTHER: None. IMPRESSION: 1. 2.8 x 3.5 x 3.5 cm LEFT frontoparietal mass concerning for metastatic disease given history of lung cancer, less likely primary glial neoplasm. Regional mass effect without midline shift. 2. Moderate chronic small vessel ischemic disease. Electronically Signed   By: CElon AlasM.D.   On: 12/29/2016 17:55   Ct Chest W Contrast  Result Date: 12/31/2016 CLINICAL DATA:  Status post resection of left brain mass. Evaluate for metastatic disease. EXAM: CT CHEST, ABDOMEN, AND PELVIS WITH CONTRAST TECHNIQUE:  Multidetector CT imaging of the chest, abdomen and pelvis was performed following the standard protocol during bolus administration of intravenous contrast. CONTRAST:  159m ISOVUE-300 IOPAMIDOL (ISOVUE-300) INJECTION 61% COMPARISON:  CT head 12/29/2016.  MRI brain 12/30/2016. FINDINGS: CT CHEST FINDINGS Cardiovascular: There is atherosclerosis of the aorta, great vessels and coronary arteries. No acute vascular findings are demonstrated. The heart size is normal. There is no pericardial effusion. Mediastinum/Nodes: There are no enlarged mediastinal, hilar or axillary lymph nodes. 15 x 11 mm left thyroid nodule noted on image 3. There is a moderate size hiatal hernia. Lungs/Pleura: There is no pleural effusion. Status post right upper lobe resection. Underlying emphysema noted. There is atelectasis medially in the left lower lobe. No evidence of pulmonary nodule, endobronchial lesion or confluent airspace opacity. Musculoskeletal/Chest wall: No chest wall mass or suspicious osseous findings. Right-sided thoracotomy defects noted. CT ABDOMEN AND PELVIS FINDINGS Hepatobiliary:  The liver is normal in density without focal abnormality. Dependent high density within the gallbladder lumen, likely a combination of sludge and stones. No gallbladder wall thickening, surrounding inflammation or biliary ductal dilatation. Pancreas: There are multiple small calcifications throughout the pancreatic parenchyma, likely due to chronic pancreatitis. No pancreatic mass, ductal dilatation or surrounding inflammation. Spleen: Normal in size without focal abnormality. Adrenals/Urinary Tract: Both adrenal glands appear normal. Both kidneys appear normal. No evidence of renal mass, hydronephrosis or urinary tract calculus. There is air in the bladder lumen, likely iatrogenic. No bladder wall thickening seen. Stomach/Bowel: No evidence of bowel wall thickening, distention or surrounding inflammatory change. Mild diverticular changes throughout the distal colon. As above, moderate-size hiatal hernia. Vascular/Lymphatic: There are no enlarged abdominal or pelvic lymph nodes. Aortic and branch vessel atherosclerosis. Reproductive: Hysterectomy.  No evidence of adnexal mass. Other: No evidence of abdominal wall mass or hernia. No ascites. Musculoskeletal: No acute or significant osseous findings. There are mild degenerative changes throughout the spine. IMPRESSION: 1. No evidence of metastatic disease within the chest, abdomen or pelvis. No primary malignancy identified. 2. Previous right upper lobe resection. Underlying emphysema and left lower lobe atelectasis noted. 3. Left thyroid nodule, indeterminate in etiology, but of doubtful clinical significance. Consider ultrasound correlation. 4.  Aortic Atherosclerosis (ICD10-I70.0). 5. Moderate-size hiatal hernia. 6. Sequela of chronic pancreatitis. Electronically Signed   By: WRichardean SaleM.D.   On: 12/31/2016 20:20   Mr BJeri CosWSAContrast  Result Date: 12/30/2016 CLINICAL DATA:  76y/o  F; EXAM: MRI HEAD WITHOUT AND WITH CONTRAST TECHNIQUE: Multiplanar,  multiecho pulse sequences of the brain and surrounding structures were obtained without and with intravenous contrast. CONTRAST:  166mMULTIHANCE GADOBENATE DIMEGLUMINE 529 MG/ML IV SOLN COMPARISON:  None. FINDINGS: Brain: Resection cavity within the left frontoparietal junction containing blood products which demonstrate T1 shortening and susceptibility blooming. Faint reduced diffusion of the cavity margins is likely related to the presence of blood products. No findings of brain parenchymal infarction. At the anterior cavity margin there is an 8 x 13 x 14 mm focus of enhancement (series 14, image 14 and series 12, image 43). No additional focus of enhancement is identified within the brain. T2 FLAIR signal abnormality surrounding the cavity is decreased from the preoperative MRI compatible with interval decrease in edema. Mass effect associated with the resection cavity and edema partially effaces the left lateral ventricle, there is no midline shift or herniation. Postsurgical changes related to the left frontoparietal craniotomy include subjacent dural enhancement and a thin postoperative extra-axial collection. There are skin staples in the overlying  scalp and mild scalp edema. There is a small volume of pneumocephalus collected predominantly over the left frontal lobe. Stable background of mild chronic microvascular ischemic changes and parenchymal volume loss of the brain for age. Vascular: Normal flow voids. Skull and upper cervical spine: As above. Sinuses/Orbits: Negative. Other: None. IMPRESSION: 1. Expected postsurgical changes related to left frontoparietal junction region tumor resection and craniotomy. 2. Nodular focus of enhancement at the anterior cavity margin measuring up to 14 mm may represent residual neoplasm, attention at follow-up is recommended. 3. No new distant metastasis identified. Electronically Signed   By: Kristine Garbe M.D.   On: 12/30/2016 22:59   Ct Abdomen Pelvis W  Contrast  Result Date: 12/31/2016 CLINICAL DATA:  Status post resection of left brain mass. Evaluate for metastatic disease. EXAM: CT CHEST, ABDOMEN, AND PELVIS WITH CONTRAST TECHNIQUE: Multidetector CT imaging of the chest, abdomen and pelvis was performed following the standard protocol during bolus administration of intravenous contrast. CONTRAST:  123m ISOVUE-300 IOPAMIDOL (ISOVUE-300) INJECTION 61% COMPARISON:  CT head 12/29/2016.  MRI brain 12/30/2016. FINDINGS: CT CHEST FINDINGS Cardiovascular: There is atherosclerosis of the aorta, great vessels and coronary arteries. No acute vascular findings are demonstrated. The heart size is normal. There is no pericardial effusion. Mediastinum/Nodes: There are no enlarged mediastinal, hilar or axillary lymph nodes. 15 x 11 mm left thyroid nodule noted on image 3. There is a moderate size hiatal hernia. Lungs/Pleura: There is no pleural effusion. Status post right upper lobe resection. Underlying emphysema noted. There is atelectasis medially in the left lower lobe. No evidence of pulmonary nodule, endobronchial lesion or confluent airspace opacity. Musculoskeletal/Chest wall: No chest wall mass or suspicious osseous findings. Right-sided thoracotomy defects noted. CT ABDOMEN AND PELVIS FINDINGS Hepatobiliary: The liver is normal in density without focal abnormality. Dependent high density within the gallbladder lumen, likely a combination of sludge and stones. No gallbladder wall thickening, surrounding inflammation or biliary ductal dilatation. Pancreas: There are multiple small calcifications throughout the pancreatic parenchyma, likely due to chronic pancreatitis. No pancreatic mass, ductal dilatation or surrounding inflammation. Spleen: Normal in size without focal abnormality. Adrenals/Urinary Tract: Both adrenal glands appear normal. Both kidneys appear normal. No evidence of renal mass, hydronephrosis or urinary tract calculus. There is air in the bladder  lumen, likely iatrogenic. No bladder wall thickening seen. Stomach/Bowel: No evidence of bowel wall thickening, distention or surrounding inflammatory change. Mild diverticular changes throughout the distal colon. As above, moderate-size hiatal hernia. Vascular/Lymphatic: There are no enlarged abdominal or pelvic lymph nodes. Aortic and branch vessel atherosclerosis. Reproductive: Hysterectomy.  No evidence of adnexal mass. Other: No evidence of abdominal wall mass or hernia. No ascites. Musculoskeletal: No acute or significant osseous findings. There are mild degenerative changes throughout the spine. IMPRESSION: 1. No evidence of metastatic disease within the chest, abdomen or pelvis. No primary malignancy identified. 2. Previous right upper lobe resection. Underlying emphysema and left lower lobe atelectasis noted. 3. Left thyroid nodule, indeterminate in etiology, but of doubtful clinical significance. Consider ultrasound correlation. 4.  Aortic Atherosclerosis (ICD10-I70.0). 5. Moderate-size hiatal hernia. 6. Sequela of chronic pancreatitis. Electronically Signed   By: WRichardean SaleM.D.   On: 12/31/2016 20:20   Ir Ivc Filter Plmt / S&i /img Guid/mod Sed  Result Date: 01/10/2017 INDICATION: 76year old female with history of DVT. 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 ANESTHESIA/SEDATION: 1.0 mg IV Versed; 50 mcg IV Fentanyl  Moderate Sedation Time:  25 minutes The patient was continuously monitored during the procedure by the interventional radiology nurse under my direct supervision. FLUOROSCOPY TIME:  Fluoroscopy Time: 3 minutes 54 seconds (21.4 mGy). COMPLICATIONS: None PROCEDURE: The procedure, risks, benefits, and alternatives were explained to the patient. Specific risks discussed include bleeding, infection, contrast reaction, renal failure, IVC filter  fracture, migration, ileo caval thrombus (3% incidence), need for further procedure, need for further surgery, pulmonary embolism, cardiopulmonary collapse, death. Questions regarding the procedure were encouraged and answered. The patient understands and consents to the procedure. Ultrasound survey was performed with images stored and sent to PACs. The neck was prepped with Betadine in a sterile fashion, and a sterile drape was applied covering the operative field. A sterile gown and sterile gloves were used for the procedure. Local anesthesia was provided with 1% Lidocaine. A micropuncture needle was used access the right internal jugular vein under ultrasound. With excellent venous blood flow returned, and an .018 micro wire was passed through the needle. Small incision was made with an 11 blade scalpel. The needle was removed, and a micropuncture sheath was placed over the wire. The inner dilator and wire were removed, and an 035 Bentson wire was advanced under fluoroscopy into the IVC. Serial dilation of the soft tissue tract was performed with an 8 [place] dilator and subsequently a 10 [place] dilator. The delivery sheath for a retrievable Bard Denali filter was passed over the Bentson wire into the IVC. The wire was removed and small contrast was used to confirm IVC location. IVC cavagram performed. Dilator was removed, and the IVC filter was then delivered, with proximal migration such that the wall contact encroached upon the inflow from the right renal vein. A Bard retrieval kit was then passed over the guidewire for repositioning. The cap of the filter was easily captured with the gooseneck snare, and the filter was repositioned to a slightly lower position at the L3 level. Final position below the lowest renal vein. Repeat cavagram performed, and the catheter/wire/sheath was removed. Manual pressure was used for hemostasis. Patient tolerated the procedure well and remained hemodynamically stable  throughout. No complications were encountered and no significant blood loss was encounter. IMPRESSION: Status post IVC filter placement via right IJ approach. Signed, Dulcy Fanny. Earleen Newport DO [facility] Radiology PLAN: This IVC filter is potentially retrievable. The patient can be assessed for filter retrieval by Interventional Radiology in approximately 8-12 weeks, or if the patient is a candidate for anti coagulation. Further recommendations regarding filter retrieval, continued surveillance or declaration of device permanence, will be made at that time. Electronically Signed   By: Corrie Mckusick D.O.   On: 01/10/2017 10:18   Dg Foot Complete Right  Result Date: 01/22/2017 CLINICAL DATA:  Injury EXAM: RIGHT FOOT COMPLETE - 3+ VIEW COMPARISON:  None. FINDINGS: Marked hallux valgus deformity.  Negative for fracture or erosion. IMPRESSION: No acute abnormality. Electronically Signed   By: Franchot Gallo M.D.   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 ANESTHESIA/SEDATION: 1.0 mg IV Versed; 50 mcg IV Fentanyl Moderate Sedation Time:  25 minutes The patient was continuously monitored during the procedure by the interventional radiology nurse under my direct supervision. FLUOROSCOPY TIME:  Fluoroscopy Time:  3 minutes 54 seconds (21.4 mGy). COMPLICATIONS: None PROCEDURE: The procedure, risks, benefits, and alternatives were explained to the patient. Specific risks discussed include bleeding, infection, contrast reaction, renal failure, IVC filter fracture, migration, ileo caval thrombus (3% incidence), need for further procedure, need for further surgery, pulmonary embolism,  cardiopulmonary collapse, death. Questions regarding the procedure were encouraged and answered. The patient understands and consents to the procedure. Ultrasound survey was performed with images stored and sent to PACs. The neck was prepped with Betadine in a sterile fashion, and a sterile drape was applied covering the operative field. A sterile gown and sterile gloves were used for the procedure. Local anesthesia was provided with 1% Lidocaine. A micropuncture needle was used access the right internal jugular vein under ultrasound. With excellent venous blood flow returned, and an .018 micro wire was passed through the needle. Small incision was made with an 11 blade scalpel. The needle was removed, and a micropuncture sheath was placed over the wire. The inner dilator and wire were removed, and an 035 Bentson wire was advanced under fluoroscopy into the IVC. Serial dilation of the soft tissue tract was performed with an 8 [place] dilator and subsequently a 10 [place] dilator. The delivery sheath for a retrievable Bard Denali filter was passed over the Bentson wire into the IVC. The wire was removed and small contrast was used to confirm IVC location. IVC cavagram performed. Dilator was removed, and the IVC filter was then delivered, with proximal migration such that the wall contact encroached upon the inflow from the right renal vein. A Bard retrieval kit was then passed over the guidewire for repositioning. The cap of the filter was easily captured with the gooseneck snare, and the filter was repositioned to a slightly lower position at the L3 level. Final position below the lowest renal vein. Repeat cavagram performed, and the catheter/wire/sheath was removed. Manual pressure was used for hemostasis. Patient tolerated the procedure well and remained hemodynamically stable throughout. No complications were encountered and no significant blood loss was encounter. IMPRESSION: Status post IVC filter placement via  right IJ approach. Signed, Dulcy Fanny. Earleen Newport DO [facility] Radiology PLAN: This IVC filter is potentially retrievable. The patient can be assessed for filter retrieval by Interventional Radiology in approximately 8-12 weeks, or if the patient is a candidate for anti coagulation. Further recommendations regarding filter retrieval, continued surveillance or declaration of device permanence, will be made at that time. Electronically Signed   By: Corrie Mckusick D.O.   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 The final pathology was consistent with metastatic carcinoma of lung primary. The molecular studies showed negative PDL1 expression.  PLAN: I had a lengthy discussion with the patient and her family today about her current disease stage, prognosis and treatment options. I recommended for the patient to stay on the high-dose Decadron for now until she has stereotactic radiotherapy to the tumor resection cavity. She is expected to see Dr. Lisbeth Renshaw later today for evaluation and discussion of this option. I will also arrange for the patient to have a PET scan performed on outpatient basis after discharge to rule out any other metastatic disease. If the patient has no evidence of recurrence on the PET scan,  I may consider monitoring her closely with repeat imaging studies at regular basis. I will also consider sending [facility] for molecular studies and to check for the presence of any target mutations. I will arrange for the patient a follow-up appointment with me at the [facility] after discharge. The patient was advised to call if she has any concerning  symptoms. The patient voices understanding of current disease status and treatment options and is in agreement with the current care plan.  All questions were answered. The patient knows to call the clinic with any problems, questions or concerns. We can certainly see the patient much sooner if necessary.  Thank you so much for allowing me to participate in the care of Tracy Bridges. I will continue to follow up the patient with you and assist in her care. Disclaimer: This note was dictated with voice recognition software. Similar sounding words can inadvertently be transcribed and may not be corrected upon review.   Eilleen Kempf January 26, 2017, 3:20 PM

## 2017-01-26 NOTE — Plan of Care (Signed)
Problem: RH BOWEL ELIMINATION Goal: RH STG MANAGE BOWEL WITH ASSISTANCE STG Manage Bowel with mod  Assistance.    Outcome: Not Progressing Last bm 10/23 after suppository

## 2017-01-26 NOTE — Progress Notes (Signed)
Physical Therapy Session Note  Patient Details  Name: Tracy Bridges MRN: [id number] Date of Birth: 08-Jul-1940  Today's Date: 01/26/2017 PT Individual Time: 6948-5462 PT Individual Time Calculation (min): 46 min   Short Term Goals: Week 2:  PT Short Term Goal 1 (Week 2): = LTGS  Skilled Therapeutic Interventions/Progress Updates:    no c/o pain but reports fatigue.  Session focus on forward weight shift, head hips relationship, and RLE NMR.    Pt transfers to therapy mat with max assist for squat/pivot with multimodal cues and manual facilitation for forward weight shift and head hips relationship.  Sitting edge of mat focus on RLE activation for ball kicks x10 reps with ball set up, 2x10 reps with rolling approach, rest breaks in between and verbal cues for decreased use of compensatory strategies.  Sit>sidelying with mod assist for LE management.  PT supported RLE in side lying and pt completes 2x10 reps hip flexion and extension with gravity eliminated, good activation of hip flexors and excellent activation of hip extensors.  Pt returned to sitting with min assist and verbal cues.  Squat/pivot back to w/c with mod assist and max multimodal cues for head/hips and forward weight shift.  Pt propelled w/c with L hemi technique back towards room x50' with mod assist.  Left upright in w/c to await OT session.    Therapy Documentation Precautions:  Precautions Precautions: Fall Restrictions Weight Bearing Restrictions: No   See Function Navigator for Current Functional Status.   Therapy/Group: Individual Therapy  Michel Santee 01/26/2017, 3:36 PM

## 2017-01-26 NOTE — Progress Notes (Signed)
Physical Therapy Session Note  Patient Details  Name: RONNEISHA JETT MRN: [id number] Date of Birth: 1941-01-20  Today's Date: 01/26/2017 PT Individual Time: 1100-1200 AND 1515-1600 PT Individual Time Calculation (min): 60 min AND 70mn   Short Term Goals: Week 1:  PT Short Term Goal 1 (Week 1): (P) Pt will perform sit<>stand with min assist consistently PT Short Term Goal 1 - Progress (Week 1): Not met PT Short Term Goal 2 (Week 1): (P) Pt will ambulate 50 ft with RW and min assist PT Short Term Goal 2 - Progress (Week 1): Not met  Skilled Therapeutic Interventions/Progress Updates:    Pt received sitting in WC and agreeable to PT. In room. Pt reports that she is waiting to here from MD at 11:00 regarding continued care for CA.   Seated BLE NMR. AAROM on the R and light manual resistance for the L. X 10-12 BLE  LAQ,  Hip flexion Hip extension from flexed position Hip abduction Isometric hip adduction  Ankle PF.    BUE AAROM ROM for the R UE to improved neuromotor control. X 10 BUE  Shoulder flexion with therapy ball.  Lateral reaches while holding ball.  Bicep curl 3# bar weight  Chest press 3# bar weight  shoulder press 3# bar weight  tricep extension 3# bar weight   Sit<>stand from WC x 3 with min assist from PT. Pt noted to have adequate activation of the RLE to perform sit<>stand as well as stabilize the RLE to prevent fall while adjusting pants.   [facility] to [facility] and all needs met.    Session 2.   Pt received sitting in WC and agreeable to PT. [facility].  PT treatment focused on functional mobility including sit<>stand transfers, gait training, and WC mobility.  Pt peformed sit<>stand from [place] [facility] with min-mod assist from PT and modeate cues for improved anterior weight shift as well as proper BUE positioning.  Gait training instructed by PT 2x  599fwith moderate assist from PT. Pt noted to have kne R knee instability with stance, but unable to perform hip flexion, requiring PT to advance the RLE.   WC mobility instructed by PT with moderate assist x 15 ft. Max cues for attention to the RUE to improved straight trajectory as well as improved posture to prevent compensatory movements at the shoulders.   [facility] returned to [facility] and [facility] and all needs met.             Therapy Documentation Precautions:  Precautions Precautions: Fall Restrictions Weight Bearing Restrictions: No Vital Signs: Therapy Vitals Pulse Rate: 81 Resp: 18 Patient Position (if appropriate): Sitting Oxygen Therapy SpO2: 97 % O2 Device: Not Delivered Pain: 0/10 at rest    See Function Navigator for Current Functional Status.   Therapy/Group: Individual Therapy  AuLorie Phenix0/24/2018, 1:49 PM

## 2017-01-26 NOTE — Telephone Encounter (Signed)
A user error has taken place: encounter opened in error, closed for administrative reasons.

## 2017-01-27 ENCOUNTER — Inpatient Hospital Stay (HOSPITAL_COMMUNITY): Payer: Medicare HMO | Admitting: Occupational Therapy

## 2017-01-27 ENCOUNTER — Other Ambulatory Visit: Payer: Self-pay | Admitting: Radiation Oncology

## 2017-01-27 ENCOUNTER — Inpatient Hospital Stay (HOSPITAL_COMMUNITY): Payer: Medicare HMO | Admitting: Physical Therapy

## 2017-01-27 ENCOUNTER — Ambulatory Visit: Payer: Medicare HMO | Admitting: Internal Medicine

## 2017-01-27 ENCOUNTER — Other Ambulatory Visit: Payer: Self-pay | Admitting: Radiation Therapy

## 2017-01-27 ENCOUNTER — Inpatient Hospital Stay (HOSPITAL_COMMUNITY): Payer: Medicare HMO

## 2017-01-27 ENCOUNTER — Ambulatory Visit: Payer: Medicare HMO | Admitting: Radiation Oncology

## 2017-01-27 ENCOUNTER — Ambulatory Visit (HOSPITAL_COMMUNITY): Payer: Medicare HMO

## 2017-01-27 DIAGNOSIS — C7931 Secondary malignant neoplasm of brain: Secondary | ICD-10-CM

## 2017-01-27 DIAGNOSIS — C7949 Secondary malignant neoplasm of other parts of nervous system: Principal | ICD-10-CM

## 2017-01-27 DIAGNOSIS — I4891 Unspecified atrial fibrillation: Secondary | ICD-10-CM

## 2017-01-27 MED ORDER — LORAZEPAM 0.5 MG PO TABS: 0.5000 mg | ORAL_TABLET | Freq: Once | ORAL | Status: DC

## 2017-01-27 MED ORDER — LORAZEPAM 0.5 MG PO TABS: ORAL_TABLET | ORAL | 0 refills | Status: AC

## 2017-01-27 NOTE — Progress Notes (Signed)
Physical Therapy Weekly Progress Note  Patient Details  Name: Tracy Bridges MRN: [id number] Date of Birth: Jan 30, 1941  Beginning of progress report period: January 20, 2017 End of progress report period: January 27, 2017   Patient has met 0 of 5 long term goals. Pt presented with functional decline due to new onset of edema around area of tumor over weekend/beginning of this week resulting in increased weakness in RUE/RLE and almost a full day missed of therapies due to lethargy. Pt was requiring min to mod assist for stand pivot transfers and some short distance (10') of gait with min to mod assist prior to new onset of weakness but then requiring up to max assist for transfers and decreased functional use of RUE/RLE since onset. Goals have been downgraded to min to mod assist for transfers at w/c level with focus on family education. Pt to also start with new radiation treatment soon and education about how effects of radiation can impact mobility (ex. Fatigue, etc). Focusing on increasing functional use of RUE/RLE and increasing independence with mobility as pt demonstrating improvements with steroid treatment the last couple days. Pt still inconsistent and therefore goals downgraded.   Patient continues to demonstrate the following deficits muscle weakness, muscle joint tightness and muscle paralysis, decreased cardiorespiratoy endurance, impaired timing and sequencing, abnormal tone and decreased coordination, decreased problem solving and decreased memory and decreased sitting balance, decreased standing balance, decreased postural control, hemiplegia and decreased balance strategies and therefore will continue to benefit from skilled PT intervention to increase functional independence with mobility.  Patient not progressing toward long term goals.  See goal revision..  Plan of care revisions: goals downgraded to min/mod assist w/c level.  PT Short Term Goals Week 2:  PT Short Term Goal 1 (Week  2): = LTGS PT Short Term Goal 1 - Progress (Week 2): Not met Week 3:  PT Short Term Goal 1 (Week 3): = LTGs - LOS adjusted due to change in medical status  Skilled Therapeutic Interventions/Progress Updates:  Ambulation/gait training;Disease management/prevention;Pain management;Stair training;Wheelchair propulsion/positioning;Therapeutic Activities;Patient/family education;DME/adaptive equipment instruction;Balance/vestibular training;Cognitive remediation/compensation;Psychosocial support;Therapeutic Exercise;UE/LE Strength taining/ROM;Skin care/wound management;Functional mobility training;Community reintegration;Discharge planning;Neuromuscular re-education;Splinting/orthotics;UE/LE Coordination activities   Therapy Documentation Precautions:  Precautions Precautions: Fall Precaution Comments: claustrophobia; R hemi (increasing weakness 10/22) Restrictions Weight Bearing Restrictions: No   See Function Navigator for Current Functional Status.   [name], PT, DPT  01/27/2017, 2:20 PM

## 2017-01-27 NOTE — Progress Notes (Signed)
Physical Therapy Note  Patient Details  Name: ZAKAIYA LARES MRN: [id number] Date of Birth: 1940/12/30 Today's Date: 01/27/2017  1435-1530, 55 min individual tx Pain: none reported  Husband present for family ed.  Pt reported she was exhuasted, but willing to work.  Family ed for husband for squat pivot w/c>< R side of bed, w/c parts management, bed mobility.  Husband has a painful L knee and is not able to bend it very much to assist pt with transfer.  He stated he plans to hire a caregiver; PT informed them HHPT will need to train caregiver.     Since pt was sleeping in a recliner PTA due to breathing problems, this PT recommended that they get a hospital bed; conveyed to [place], [place].  In supine, active assistive R hip abd/adduction, R active assistive short arc quad knee ext, active R ankle pumps.  Pt left resting in bed with all needs in place, R arm positioned for comfort, and husband present.  See function navigator for current status.  Brodee Mauritz 01/27/2017, 2:38 PM

## 2017-01-27 NOTE — Progress Notes (Signed)
Occupational Therapy Session Note  Patient Details  Name: Tracy Bridges MRN: [id number] Date of Birth: [birth year]  Today's Date: 01/27/2017 OT Individual Time: 5638-7564 OT Individual Time Calculation (min): 57 min    Short Term Goals: Week 2:  OT Short Term Goal 1 (Week 2): Pt will complete UB dressing with Min A OT Short Term Goal 2 (Week 2): Pt will consistently complete sit<stand during LB dressing with Mod A OT Short Term Goal 3 (Week 2): Pt will complete 1/3 components of toileting   Skilled Therapeutic Interventions/Progress Updates:    Pt received sitting EOB, ready for OT treatment session and requesting need to use bathroom. Pt completed squat pivot transfer EOB>w/c with MaxA, stand pivot transfer w/c<>BSC over toilet using grab bars with MaxA, requires MaxA for clothing management during task completion. Pt completed bathing/dressing ADLs seated in w/c at sink with ModA for sit<>stand portions of task completion. Pt ultimately requires total assist for LB dressing task, ModA for UB dressing using hemi techniques. Pt requires increased time and rest breaks throughout task completion due to fatigue. Pt left seated in w/c, call bell and needs within reach, respiratory therapy present for breathing treatment.   Therapy Documentation Precautions:  Precautions Precautions: Fall Restrictions Weight Bearing Restrictions: No   Pain: Pain Assessment Pain Assessment: No/denies pain  See Function Navigator for Current Functional Status.   Therapy/Group: Individual Therapy  Raymondo Band 01/27/2017, 12:29 PM

## 2017-01-27 NOTE — Progress Notes (Signed)
[place] PHYSICAL MEDICINE & REHABILITATION     PROGRESS NOTE    Subjective/Complaints: Lying in bed. No new complaints. Happy that she's experiencing some improvement in her weakness.   ROS: pt denies nausea, vomiting, diarrhea, cough, shortness of breath or chest pain   Objective: Vital Signs: Blood pressure (!) 124/51, pulse 64, temperature 97.7 F (36.5 C), temperature source Oral, resp. rate 18, height 5\' 5"  (1.651 m), weight 78.1 kg (172 lb 1.6 oz), SpO2 96 %. No results found.  Recent Labs  01/24/17 2155  WBC 5.1  HGB 11.7*  HCT 36.0  PLT 249    Recent Labs  01/24/17 2155  NA 138  K 4.3  CL 101  GLUCOSE 200*  BUN 19  CREATININE 1.00  CALCIUM 9.1   CBG (last 3)  No results for input(s): GLUCAP in the last 72 hours.  Wt Readings from Last 3 Encounters:  01/27/17 78.1 kg (172 lb 1.6 oz)  01/10/17 81.8 kg (180 lb 6.4 oz)  01/09/17 77.7 kg (171 lb 3.2 oz)    Physical Exam:  Constitutional: She is oriented to person, place, and time. She appears well-developedand well-nourished. No distress but does look a little fatigued.  HENT:  Head: Normocephalicand atraumatic.  Eyes: EOMI. No discharge.  Neck: Normal range of motion. Neck supple.  Cardiovascular: RRR without murmur. No JVD     . Respiratory: CTA Bilaterally without wheezes or rales. Normal effort      GI: BS +, non-tender, non-distended   Musculoskeletal: She exhibits trace RLE edema --stable Neurological: She is alertand oriented to person, place, and time.  Speech more fluid and automatic  Decreased LT right leg.    RUE   2 to 2+/5 deltoid, biceps, triceps, wrist, HI. RLE: 1+ to 2/5 HAD/HF, 1/5 distally   Skin: Skin is warmand dry.   Psychiatric: up beat and smiling. .   Assessment/Plan: 1. 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 Physiatrist is providing close team supervision and 24 hour management of active medical problems listed below. Physiatrist and rehab team continue to assess barriers to discharge/monitor patient progress toward functional and medical goals.  Function:  Bathing Bathing position   Position: Wheelchair/chair at sink  Bathing parts Body parts bathed by patient: Chest, Abdomen, Front perineal area, Right upper leg, Left upper leg Body parts bathed by helper: Right arm, Left arm, Buttocks, Right lower leg, Left lower leg, Back  Bathing assist Assist Level:  (Mod A)      Upper Body Dressing/Undressing Upper body dressing   What is the patient wearing?: Pull over shirt/dress     Pull over shirt/dress - Perfomed by patient: Thread/unthread left sleeve, Put head through opening Pull over shirt/dress - Perfomed by helper: Thread/unthread right sleeve, Pull shirt over trunk        Upper body assist Assist Level:  (Mod A)   Set up : To obtain clothing/put away  Lower Body Dressing/Undressing Lower body dressing   What is the patient wearing?: Non-skid slipper socks, Underwear, Pants Underwear - Performed by patient: Thread/unthread left underwear leg Underwear - Performed by helper: Thread/unthread right underwear leg, Pull underwear up/down Pants- Performed by patient: Thread/unthread left pants leg Pants- Performed by helper: Thread/unthread right pants leg, Pull pants up/down Non-skid slipper socks- Performed by patient: Don/doff right sock, Don/doff left sock Non-skid slipper socks- Performed by helper: Don/doff  right sock, Don/doff left sock               TED Hose - Performed by helper:  (pt refusing Teds/ace wraps)  Lower body assist Assist for lower body dressing:  (Max A sit<stand in [place])      Toileting Toileting   Toileting steps completed by patient: [place] prior to toileting, Adjust clothing after toileting Toileting steps completed by helper:  Adjust clothing prior to toileting, Performs perineal hygiene, Adjust clothing after toileting Toileting Assistive Devices: Grab bar or rail  Toileting assist Assist level: Two helpers   Transfers Chair/bed transfer   Chair/bed transfer method: Squat pivot Chair/bed transfer assist level: Maximal assist (Pt 25 - 49%/lift and lower) Chair/bed transfer assistive device: Armrests Mechanical lift: [name]   Locomotion Ambulation     Max distance: 12' Assist level: Touching or steadying assistance (Pt > 75%)   Wheelchair   Type: Manual Max wheelchair distance: 15' Assist Level: Moderate assistance (Pt 50 - 74%)  Cognition Comprehension Comprehension assist level: Follows complex conversation/direction with extra time/assistive device  Expression Expression assist level: Expresses complex ideas: With extra time/assistive device  Social Interaction Social Interaction assist level: Interacts appropriately with others - No medications needed.  Problem Solving Problem solving assist level: Solves complex problems: With extra time  Memory Memory assist level: More than reasonable amount of time   Medical Problem List and Plan: 1. Functional deficits and right hemiparesissecondary to metastatic lung cancer to the brain. -CT :increased edema at site of residual tumor, left-fronto-parietal area        -continue decadron iv 8mg  q6     -oncology/rad-onc have seen patient. Appreciate their care and follow up  -pt for simulation for tomorrow or Monday with XRT to begin in November  -pt/family up to date with plan. I reviewed again with the patient today        -pt is demonstrating neurological improvement with decadron  -all notes/labs/films reviewed 2. DVT left gastroc: Mechanical: Sequential compression devices, below knee Bilateral lower extremities, IVC filter in place.   - continjue elevation of legs when resting, compression stockings, etc  3. Headaches/Pain Management:  Headaches persistent and being managed with prn hydocodone.  4. Mood: LCSW to follow for evaluation and support.  5. Neuropsych: This patient iscapable of making decisions on herown behalf. 6. Skin/Wound Care: routine pressure relief measures.  --staples out 7. Fluids/Electrolytes/Nutrition/dysphagia: Monitor I/O. BUN   normal 13  -SLP to follow up re: safety with diet  -D2 thins currently  -potassium 4.3 on 10/22---continue supplement--- 8. Nausea: Likely central.  Some improvement.  9. H/o A Fib: Managed with diltiazem. Monitor HR bid. HR controlled 10/19 Vitals:   01/26/17 2021 01/27/17 0508  BP:  (!) 124/51  Pulse:  64  Resp:  18  Temp:  97.7 F (36.5 C)  SpO2: 100% 96%   10. Emphysema/Bronchiectasis: continue nebulizers.  chest PT bid as at home. Continue oxygen at nights and prn during the day.  11. Leucocytosis: resolved   -likely steroid related   -6.4 10/15 12. Constipation:  Moving bowels    -use prn miralax  -encourage fruits/fiber  -encourage PO fluids 13.  GERD-  Protonix  Increased to 40mg  bid with improvement   LOS (Days) 17 A FACE TO FACE EVALUATION WAS PERFORMED  Meredith Staggers, MD 01/27/2017 9:18 AM

## 2017-01-27 NOTE — Progress Notes (Signed)
Occupational Therapy Session Note  Patient Details  Name: Tracy Bridges MRN: [id number] Date of Birth: 1940/09/23  Today's Date: 01/27/2017 OT Individual Time: 1300-1415 OT Individual Time Calculation (min): 75 min    Short Term Goals: Week 2:  OT Short Term Goal 1 (Week 2): Pt will complete UB dressing with Min A OT Short Term Goal 2 (Week 2): Pt will consistently complete sit<stand during LB dressing with Mod A OT Short Term Goal 3 (Week 2): Pt will complete 1/3 components of toileting   Skilled Therapeutic Interventions/Progress Updates:    Pt completed self feeding to start session from wheelchair level.  She used the RUE as a stabilizer for her plate , while scooping the food with the fork in the left hand.  Next had pt wash hands at the sink in preparation for transition out of the room.  Therapist transported pt to the dayroom where she utilized UE ergonometer for 3 sets of 3 mins.  Ace bandage applied to the right hand as she was not able to maintain grip more than a few seconds.  She completed the first set using BUEs with resistance on level 5 and RPMs maintained greater than 24.  Heart rate and O2 at 97 BPM, and 98 % O2.Second and third sets completed with isolated use of the RUE and min assist.  Pt unable to move handle fast enough to generate readable RPMs but did maintain moving it for the 3 mins.  Next had pt work on AROM shoulder internal and external rotation with shoulder flexion using towel and table.  From sitting, had pt push towel forward and backwards with the LUE with min assist.  She was able to adduct her arm across the table with supervision but needed min assist for abduction.  Completed 3 intervals of 1 min with pt needing rest in between.  Finished session with return to the room with call button and phone in reach and spouse present as well.      Therapy Documentation Precautions:  Precautions Precautions: Fall Precaution Comments: claustrophobia; R hemi  (increasing weakness 10/22) Restrictions Weight Bearing Restrictions: No  Pain: Pain Assessment Pain Assessment: No/denies pain ADL: See Function Navigator for Current Functional Status.   Therapy/Group: Individual Therapy  Signe Tackitt OTR/L 01/27/2017, 4:22 PM

## 2017-01-27 NOTE — Progress Notes (Signed)
Social Work Patient ID: Lorri Frederick, female   DOB: [birth year], 76 y.o.   MRN: [id number]   Have reviewed team conference with pt and spouse.  Both aware that team recommended extension of CIR with new target d/c date of 11/2.  Decision made due to decline in function earlier in week due to medical issues.  They understand and agree with plan.  They are also aware that I will assist in arranging transport for pt next week to radiation onc (Tues pm).  Continue to follow.  Jada Kuhnert, LCSW

## 2017-01-27 NOTE — Progress Notes (Signed)
Has armband been applied?   Does patient have an allergy to IV contrast dye?: NO   Has patient ever received premedication for IV contrast dye?: NO  Does patient take metformin?: NO  If patient does take metformin when was the last dose: N/A  Date of lab work: 01/24/17 BUN: 19 CR: 1.0, EFGR_>60 IV site:   Has IV site been added to flowsheet?

## 2017-01-27 NOTE — Progress Notes (Signed)
We anticipate treatment on 02/10/17 and to have postop imaging be as precise as possible we will proceed with MRI and CT simulation within 4 days of treatment. Manuela Schwartz will call the patient's husband. If she is still inpt we will plan to proceed with simulation and MRI, but for now I've cancelled this so it is performed at the appropriate time.    Carola Rhine, PAC

## 2017-01-28 ENCOUNTER — Inpatient Hospital Stay (HOSPITAL_COMMUNITY): Payer: Medicare HMO

## 2017-01-28 ENCOUNTER — Other Ambulatory Visit: Payer: Self-pay | Admitting: Radiation Therapy

## 2017-01-28 ENCOUNTER — Inpatient Hospital Stay (HOSPITAL_COMMUNITY)
Admission: AD | Admit: 2017-01-28 | Discharge: 2017-02-03 | DRG: 291 | Disposition: E | Payer: Medicare HMO | Source: Intra-hospital | Attending: Internal Medicine | Admitting: Internal Medicine

## 2017-01-28 ENCOUNTER — Other Ambulatory Visit: Payer: Self-pay

## 2017-01-28 ENCOUNTER — Encounter (HOSPITAL_COMMUNITY): Payer: Self-pay

## 2017-01-28 ENCOUNTER — Inpatient Hospital Stay (HOSPITAL_COMMUNITY): Payer: Medicare HMO | Admitting: Occupational Therapy

## 2017-01-28 DIAGNOSIS — S2232XA Fracture of one rib, left side, initial encounter for closed fracture: Secondary | ICD-10-CM | POA: Diagnosis present

## 2017-01-28 DIAGNOSIS — J449 Chronic obstructive pulmonary disease, unspecified: Secondary | ICD-10-CM | POA: Diagnosis present

## 2017-01-28 DIAGNOSIS — I4891 Unspecified atrial fibrillation: Secondary | ICD-10-CM | POA: Diagnosis present

## 2017-01-28 DIAGNOSIS — G936 Cerebral edema: Secondary | ICD-10-CM | POA: Diagnosis present

## 2017-01-28 DIAGNOSIS — G931 Anoxic brain damage, not elsewhere classified: Secondary | ICD-10-CM | POA: Diagnosis present

## 2017-01-28 DIAGNOSIS — D638 Anemia in other chronic diseases classified elsewhere: Secondary | ICD-10-CM | POA: Diagnosis present

## 2017-01-28 DIAGNOSIS — E872 Acidosis: Secondary | ICD-10-CM | POA: Diagnosis present

## 2017-01-28 DIAGNOSIS — N170 Acute kidney failure with tubular necrosis: Secondary | ICD-10-CM | POA: Diagnosis present

## 2017-01-28 DIAGNOSIS — Z86718 Personal history of other venous thrombosis and embolism: Secondary | ICD-10-CM

## 2017-01-28 DIAGNOSIS — Z66 Do not resuscitate: Secondary | ICD-10-CM | POA: Diagnosis present

## 2017-01-28 DIAGNOSIS — X58XXXA Exposure to other specified factors, initial encounter: Secondary | ICD-10-CM | POA: Diagnosis present

## 2017-01-28 DIAGNOSIS — R1310 Dysphagia, unspecified: Secondary | ICD-10-CM | POA: Diagnosis present

## 2017-01-28 DIAGNOSIS — C7931 Secondary malignant neoplasm of brain: Secondary | ICD-10-CM

## 2017-01-28 DIAGNOSIS — I959 Hypotension, unspecified: Secondary | ICD-10-CM | POA: Diagnosis not present

## 2017-01-28 DIAGNOSIS — N182 Chronic kidney disease, stage 2 (mild): Secondary | ICD-10-CM | POA: Diagnosis not present

## 2017-01-28 DIAGNOSIS — Y92238 Other place in hospital as the place of occurrence of the external cause: Secondary | ICD-10-CM | POA: Diagnosis present

## 2017-01-28 DIAGNOSIS — E875 Hyperkalemia: Secondary | ICD-10-CM | POA: Diagnosis present

## 2017-01-28 DIAGNOSIS — D6489 Other specified anemias: Secondary | ICD-10-CM | POA: Diagnosis present

## 2017-01-28 DIAGNOSIS — D62 Acute posthemorrhagic anemia: Secondary | ICD-10-CM | POA: Diagnosis not present

## 2017-01-28 DIAGNOSIS — I469 Cardiac arrest, cause unspecified: Secondary | ICD-10-CM | POA: Diagnosis not present

## 2017-01-28 DIAGNOSIS — Z85118 Personal history of other malignant neoplasm of bronchus and lung: Secondary | ICD-10-CM

## 2017-01-28 DIAGNOSIS — R7989 Other specified abnormal findings of blood chemistry: Secondary | ICD-10-CM | POA: Diagnosis not present

## 2017-01-28 DIAGNOSIS — Z4659 Encounter for fitting and adjustment of other gastrointestinal appliance and device: Secondary | ICD-10-CM

## 2017-01-28 DIAGNOSIS — J9602 Acute respiratory failure with hypercapnia: Secondary | ICD-10-CM | POA: Diagnosis present

## 2017-01-28 DIAGNOSIS — C7949 Secondary malignant neoplasm of other parts of nervous system: Principal | ICD-10-CM

## 2017-01-28 DIAGNOSIS — Z87891 Personal history of nicotine dependence: Secondary | ICD-10-CM | POA: Diagnosis not present

## 2017-01-28 DIAGNOSIS — R55 Syncope and collapse: Secondary | ICD-10-CM | POA: Diagnosis not present

## 2017-01-28 DIAGNOSIS — R570 Cardiogenic shock: Secondary | ICD-10-CM | POA: Diagnosis present

## 2017-01-28 DIAGNOSIS — R739 Hyperglycemia, unspecified: Secondary | ICD-10-CM | POA: Diagnosis present

## 2017-01-28 DIAGNOSIS — G8191 Hemiplegia, unspecified affecting right dominant side: Secondary | ICD-10-CM | POA: Diagnosis not present

## 2017-01-28 DIAGNOSIS — J438 Other emphysema: Secondary | ICD-10-CM | POA: Diagnosis not present

## 2017-01-28 DIAGNOSIS — R4182 Altered mental status, unspecified: Secondary | ICD-10-CM | POA: Diagnosis present

## 2017-01-28 DIAGNOSIS — S270XXA Traumatic pneumothorax, initial encounter: Secondary | ICD-10-CM | POA: Diagnosis present

## 2017-01-28 DIAGNOSIS — I82402 Acute embolism and thrombosis of unspecified deep veins of left lower extremity: Secondary | ICD-10-CM | POA: Diagnosis not present

## 2017-01-28 LAB — POCT I-STAT 3, ART BLOOD GAS (G3+)
ACID-BASE DEFICIT: 20 mmol/L — AB (ref 0.0–2.0)
Acid-base deficit: 9 mmol/L — ABNORMAL HIGH (ref 0.0–2.0)
BICARBONATE: 19.2 mmol/L — AB (ref 20.0–28.0)
Bicarbonate: 11.5 mmol/L — ABNORMAL LOW (ref 20.0–28.0)
O2 Saturation: 100 %
O2 Saturation: 99 %
PCO2 ART: 55.4 mmHg — AB (ref 32.0–48.0)
PO2 ART: 499 mmHg — AB (ref 83.0–108.0)
PO2 ART: 167 mmHg — AB (ref 83.0–108.0)
Patient temperature: 98.6
TCO2: 13 mmol/L — ABNORMAL LOW (ref 22–32)
TCO2: 21 mmol/L — ABNORMAL LOW (ref 22–32)
pCO2 arterial: 52.1 mmHg — ABNORMAL HIGH (ref 32.0–48.0)
pH, Arterial: 6.954 — CL (ref 7.350–7.450)
pH, Arterial: 7.148 — CL (ref 7.350–7.450)

## 2017-01-28 LAB — CBC WITH DIFFERENTIAL/PLATELET
BASOS PCT: 2 %
Basophils Absolute: 0.4 10*3/uL — ABNORMAL HIGH (ref 0.0–0.1)
EOS ABS: 0 10*3/uL (ref 0.0–0.7)
EOS PCT: 0 %
HEMATOCRIT: 31.9 % — AB (ref 36.0–46.0)
HEMOGLOBIN: 9.5 g/dL — AB (ref 12.0–15.0)
LYMPHS PCT: 24 %
Lymphs Abs: 4.5 10*3/uL — ABNORMAL HIGH (ref 0.7–4.0)
MCH: 30.2 pg (ref 26.0–34.0)
MCHC: 29.8 g/dL — ABNORMAL LOW (ref 30.0–36.0)
MCV: 101.3 fL — AB (ref 78.0–100.0)
Monocytes Absolute: 0.4 10*3/uL (ref 0.1–1.0)
Monocytes Relative: 2 %
NEUTROS ABS: 13.4 10*3/uL — AB (ref 1.7–7.7)
Neutrophils Relative %: 72 %
PLATELETS: 237 10*3/uL (ref 150–400)
RBC: 3.15 MIL/uL — ABNORMAL LOW (ref 3.87–5.11)
RDW: 16.7 % — ABNORMAL HIGH (ref 11.5–15.5)
WBC: 18.7 10*3/uL — ABNORMAL HIGH (ref 4.0–10.5)

## 2017-01-28 LAB — BASIC METABOLIC PANEL
ANION GAP: 12 (ref 5–15)
BUN: 55 mg/dL — ABNORMAL HIGH (ref 6–20)
CALCIUM: 8.7 mg/dL — AB (ref 8.9–10.3)
CO2: 17 mmol/L — ABNORMAL LOW (ref 22–32)
Chloride: 107 mmol/L (ref 101–111)
Creatinine, Ser: 1.24 mg/dL — ABNORMAL HIGH (ref 0.44–1.00)
GFR, EST AFRICAN AMERICAN: 48 mL/min — AB (ref >60)
GFR, EST NON AFRICAN AMERICAN: 41 mL/min — AB (ref >60)
GLUCOSE: 311 mg/dL — AB (ref 65–99)
POTASSIUM: 5.4 mmol/L — AB (ref 3.5–5.1)
Sodium: 136 mmol/L (ref 135–145)

## 2017-01-28 LAB — URINALYSIS, ROUTINE W REFLEX MICROSCOPIC
BILIRUBIN URINE: NEGATIVE
GLUCOSE, UA: 150 mg/dL — AB
Ketones, ur: NEGATIVE mg/dL
LEUKOCYTES UA: NEGATIVE
NITRITE: NEGATIVE
PH: 5 (ref 5.0–8.0)
Protein, ur: 100 mg/dL — AB
SPECIFIC GRAVITY, URINE: 1.018 (ref 1.005–1.030)

## 2017-01-28 LAB — PROTIME-INR
INR: >10
Prothrombin Time: >90 seconds — ABNORMAL HIGH (ref 11.4–15.2)

## 2017-01-28 LAB — COMPREHENSIVE METABOLIC PANEL
ALK PHOS: 69 U/L (ref 38–126)
ALT: 1018 U/L — AB (ref 14–54)
AST: 820 U/L — ABNORMAL HIGH (ref 15–41)
Albumin: 2.1 g/dL — ABNORMAL LOW (ref 3.5–5.0)
Anion gap: 27 — ABNORMAL HIGH (ref 5–15)
BILIRUBIN TOTAL: 0.8 mg/dL (ref 0.3–1.2)
BUN: 54 mg/dL — AB (ref 6–20)
CALCIUM: 9.6 mg/dL (ref 8.9–10.3)
CO2: 12 mmol/L — ABNORMAL LOW (ref 22–32)
CREATININE: 1.81 mg/dL — AB (ref 0.44–1.00)
Chloride: 100 mmol/L — ABNORMAL LOW (ref 101–111)
GFR, EST AFRICAN AMERICAN: 30 mL/min — AB (ref >60)
GFR, EST NON AFRICAN AMERICAN: 26 mL/min — AB (ref >60)
Glucose, Bld: 625 mg/dL (ref 65–99)
Potassium: 6.8 mmol/L (ref 3.5–5.1)
Sodium: 139 mmol/L (ref 135–145)
Total Protein: 3.8 g/dL — ABNORMAL LOW (ref 6.5–8.1)

## 2017-01-28 LAB — GLUCOSE, CAPILLARY
GLUCOSE-CAPILLARY: 277 mg/dL — AB (ref 65–99)
GLUCOSE-CAPILLARY: 253 mg/dL — AB (ref 65–99)
Glucose-Capillary: 449 mg/dL — ABNORMAL HIGH (ref 65–99)
Glucose-Capillary: 527 mg/dL (ref 65–99)

## 2017-01-28 LAB — TROPONIN I: TROPONIN I: 0.38 ng/mL — AB (ref <0.03)

## 2017-01-28 LAB — CBC
HCT: 36.3 % (ref 36.0–46.0)
Hemoglobin: 11.5 g/dL — ABNORMAL LOW (ref 12.0–15.0)
MCH: 29.8 pg (ref 26.0–34.0)
MCHC: 31.7 g/dL (ref 30.0–36.0)
MCV: 94 fL (ref 78.0–100.0)
PLATELETS: 298 10*3/uL (ref 150–400)
RBC: 3.86 MIL/uL — AB (ref 3.87–5.11)
RDW: 16.3 % — AB (ref 11.5–15.5)
WBC: 12.2 10*3/uL — AB (ref 4.0–10.5)

## 2017-01-28 LAB — LACTIC ACID, PLASMA: LACTIC ACID, VENOUS: 19.9 mmol/L — AB (ref 0.5–1.9)

## 2017-01-28 LAB — APTT: aPTT: 191 seconds (ref 24–36)

## 2017-01-28 MED ORDER — HYDROCORTISONE NA SUCCINATE PF 100 MG IJ SOLR
50.0000 mg | Freq: Four times a day (QID) | INTRAMUSCULAR | Status: DC
  Filled 2017-01-28 (×2): qty 1

## 2017-01-28 MED ORDER — SODIUM CHLORIDE 0.9 % IV SOLN
0.0000 ug/min | INTRAVENOUS | Status: DC
  Filled 2017-01-28: qty 1

## 2017-01-28 MED ORDER — NOREPINEPHRINE BITARTRATE 1 MG/ML IV SOLN
0.0000 ug/min | INTRAVENOUS | Status: DC
  Administered 2017-01-28: 80 ug/min via INTRAVENOUS
  Filled 2017-01-28 (×3): qty 16

## 2017-01-28 MED ORDER — SODIUM CHLORIDE 0.9 % IV BOLUS (SEPSIS)
1000.0000 mL | Freq: Once | INTRAVENOUS | Status: AC
  Administered 2017-01-28: 1000 mL via INTRAVENOUS

## 2017-01-28 MED ORDER — INSULIN REGULAR HUMAN 100 UNIT/ML IJ SOLN
INTRAMUSCULAR | Status: DC
  Administered 2017-01-28: 4.7 [IU]/h via INTRAVENOUS
  Filled 2017-01-28: qty 1

## 2017-01-28 MED ORDER — SODIUM BICARBONATE 8.4 % IV SOLN
INTRAVENOUS | Status: AC
  Administered 2017-01-28: 50 meq
  Filled 2017-01-28: qty 100

## 2017-01-28 MED ORDER — SODIUM BICARBONATE 8.4 % IV SOLN
100.0000 meq | Freq: Once | INTRAVENOUS | Status: AC
Start: 2017-01-28 — End: 2017-01-28
  Administered 2017-01-28: 100 meq via INTRAVENOUS

## 2017-01-28 MED ORDER — INSULIN ASPART 100 UNIT/ML ~~LOC~~ SOLN
10.0000 [IU] | Freq: Once | SUBCUTANEOUS | Status: AC
  Administered 2017-01-28: 10 [IU] via INTRAVENOUS

## 2017-01-28 MED ORDER — HEPARIN SODIUM (PORCINE) 5000 UNIT/ML IJ SOLN: 5000.0000 [IU] | Freq: Three times a day (TID) | INTRAMUSCULAR | Status: DC

## 2017-01-28 MED ORDER — SODIUM CHLORIDE 0.9 % IV BOLUS (SEPSIS)
250.0000 mL | Freq: Once | INTRAVENOUS | Status: AC
  Administered 2017-01-28: 250 mL via INTRAVENOUS

## 2017-01-28 MED ORDER — STERILE WATER FOR INJECTION IV SOLN
Freq: Once | INTRAVENOUS | Status: AC
  Filled 2017-01-28: qty 850

## 2017-01-28 MED ORDER — DEXTROSE 50 % IV SOLN
1.0000 | Freq: Once | INTRAVENOUS | Status: AC
Start: 2017-01-28 — End: 2017-01-28
  Administered 2017-01-28: 50 mL via INTRAVENOUS
  Filled 2017-01-28: qty 50

## 2017-01-28 MED ORDER — SODIUM BICARBONATE 8.4 % IV SOLN
INTRAVENOUS | Status: AC
  Administered 2017-01-28: 50 meq
  Filled 2017-01-28: qty 50

## 2017-01-28 MED ORDER — FENTANYL CITRATE (PF) 100 MCG/2ML IJ SOLN: 25.0000 ug | INTRAMUSCULAR | Status: DC | PRN

## 2017-01-28 MED ORDER — DEXTROSE-NACL 5-0.45 % IV SOLN: INTRAVENOUS | Status: DC

## 2017-01-28 MED ORDER — STERILE WATER FOR INJECTION IV SOLN
INTRAVENOUS | Status: DC
  Administered 2017-01-28: via INTRAVENOUS
  Filled 2017-01-28 (×4): qty 850

## 2017-01-28 MED ORDER — VITAMIN K1 10 MG/ML IJ SOLN
10.0000 mg | Freq: Every day | INTRAVENOUS | Status: AC
  Administered 2017-01-28: 10 mg via INTRAVENOUS
  Filled 2017-01-28: qty 1

## 2017-01-28 MED ORDER — VASOPRESSIN 20 UNIT/ML IV SOLN
0.0400 [IU]/min | INTRAVENOUS | Status: DC
  Administered 2017-01-28: 0.04 [IU]/min via INTRAVENOUS
  Filled 2017-01-28: qty 2

## 2017-01-28 MED ORDER — EPINEPHRINE PF 1 MG/ML IJ SOLN
0.5000 ug/min | INTRAMUSCULAR | Status: DC
  Filled 2017-01-28: qty 4

## 2017-01-28 MED ORDER — IPRATROPIUM-ALBUTEROL 0.5-2.5 (3) MG/3ML IN SOLN
3.0000 mL | Freq: Four times a day (QID) | RESPIRATORY_TRACT | Status: DC
  Administered 2017-01-28: 3 mL via RESPIRATORY_TRACT
  Filled 2017-01-28: qty 3

## 2017-01-28 MED ORDER — POTASSIUM CHLORIDE 10 MEQ/100ML IV SOLN: 10.0000 meq | INTRAVENOUS | Status: AC

## 2017-01-28 MED ORDER — SODIUM BICARBONATE 8.4 % IV SOLN
200.0000 meq | Freq: Once | INTRAVENOUS | Status: AC
  Administered 2017-01-28: 200 meq via INTRAVENOUS

## 2017-01-28 MED ORDER — SODIUM CHLORIDE 0.9 % IV SOLN
1.0000 g | Freq: Two times a day (BID) | INTRAVENOUS | Status: DC
  Filled 2017-01-28 (×2): qty 1

## 2017-01-28 MED ORDER — INSULIN ASPART 100 UNIT/ML ~~LOC~~ SOLN: 0.0000 [IU] | Freq: Every day | SUBCUTANEOUS | Status: DC

## 2017-01-28 MED ORDER — INSULIN ASPART 100 UNIT/ML ~~LOC~~ SOLN: 2.0000 [IU] | SUBCUTANEOUS | Status: DC

## 2017-01-28 MED ORDER — EPINEPHRINE PF 1 MG/10ML IJ SOSY
PREFILLED_SYRINGE | INTRAMUSCULAR | Status: AC
  Filled 2017-01-28: qty 20

## 2017-01-28 MED ORDER — SODIUM CHLORIDE 0.45 % IV SOLN: INTRAVENOUS | Status: DC

## 2017-01-28 MED ORDER — SODIUM CHLORIDE 0.9 % IV SOLN
0.0000 ug/min | INTRAVENOUS | Status: DC
  Administered 2017-01-28: 20 ug/min via INTRAVENOUS
  Filled 2017-01-28 (×3): qty 4

## 2017-01-28 MED ORDER — SODIUM CHLORIDE 0.9 % IV SOLN
INTRAVENOUS | Status: DC
  Administered 2017-01-28: 23:00:00 via INTRAVENOUS

## 2017-01-28 MED ORDER — INSULIN ASPART 100 UNIT/ML ~~LOC~~ SOLN
0.0000 [IU] | Freq: Three times a day (TID) | SUBCUTANEOUS | Status: DC
  Administered 2017-01-28: 15 [IU] via SUBCUTANEOUS

## 2017-01-28 MED ORDER — SODIUM CHLORIDE 0.9 % IV SOLN: Freq: Once | INTRAVENOUS | Status: DC

## 2017-01-28 MED ORDER — SODIUM CHLORIDE 0.9 % IV SOLN
500.0000 mg | Freq: Two times a day (BID) | INTRAVENOUS | Status: DC
  Filled 2017-01-28 (×2): qty 5

## 2017-01-28 MED ORDER — EPINEPHRINE PF 1 MG/ML IJ SOLN
0.5000 ug/min | INTRAMUSCULAR | Status: DC
  Administered 2017-01-28: 10 ug/min via INTRAVENOUS
  Filled 2017-01-28 (×2): qty 4

## 2017-01-28 MED ORDER — SODIUM CHLORIDE 0.9 % IV SOLN: INTRAVENOUS | Status: AC

## 2017-01-28 MED ORDER — SODIUM CHLORIDE 0.9 % IV SOLN
1.0000 g | Freq: Once | INTRAVENOUS | Status: AC
  Administered 2017-01-28: 1 g via INTRAVENOUS
  Filled 2017-01-28: qty 10

## 2017-01-28 MED ORDER — INSULIN REGULAR BOLUS VIA INFUSION: 10.0000 [IU] | Freq: Once | INTRAVENOUS | Status: DC

## 2017-01-28 MED ORDER — FAMOTIDINE IN NACL 20-0.9 MG/50ML-% IV SOLN
20.0000 mg | Freq: Two times a day (BID) | INTRAVENOUS | Status: DC
  Filled 2017-01-28 (×2): qty 50

## 2017-01-28 MED ORDER — SODIUM BICARBONATE 8.4 % IV SOLN
INTRAVENOUS | Status: AC
  Administered 2017-01-29
  Filled 2017-01-28: qty 200

## 2017-01-28 MED ORDER — LINEZOLID 600 MG/300ML IV SOLN
600.0000 mg | Freq: Two times a day (BID) | INTRAVENOUS | Status: DC
  Filled 2017-01-28 (×2): qty 300

## 2017-01-28 MED FILL — Medication: Qty: 1 | Status: AC

## 2017-01-28 NOTE — Progress Notes (Signed)
This was a code blue brick pager for a coding 76 year old female caucasian. Had brain tumor surgery earlier and developed complications.  Had many chest compressions and later transferred from 4W to 53M(ICU) Husband and daughter present in emotional distress. Chaplain provided emotional support and compassionate presence.  Chaplain Nestor Ramp.

## 2017-01-28 NOTE — Progress Notes (Signed)
[place] PHYSICAL MEDICINE & REHABILITATION     PROGRESS NOTE    Subjective/Complaints:   Happy that her right arm and leg are stronger. Using right hand to feed herself this morning. ?mild dysuria  ROS: pt denies nausea, vomiting, diarrhea, cough, shortness of breath or chest pain   Objective: Vital Signs: Blood pressure (!) 144/40, pulse 66, temperature 97.6 F (36.4 C), temperature source Oral, resp. rate 18, height 5\' 5"  (1.651 m), weight 73.1 kg (161 lb 3.2 oz), SpO2 97 %. No results found. No results for input(s): WBC, HGB, HCT, PLT in the last 72 hours. No results for input(s): NA, K, CL, GLUCOSE, BUN, CREATININE, CALCIUM in the last 72 hours.  Invalid input(s): CO CBG (last 3)  No results for input(s): GLUCAP in the last 72 hours.  Wt Readings from Last 3 Encounters:  01/27/2017 73.1 kg (161 lb 3.2 oz)  01/10/17 81.8 kg (180 lb 6.4 oz)  01/09/17 77.7 kg (171 lb 3.2 oz)    Physical Exam:  Constitutional: She is oriented to person, place, and time. She appears well-developedand well-nourished. No distress but does look a little fatigued.  HENT:  Head: Normocephalicand atraumatic.  Eyes: EOMI. No discharge.  Neck: Normal range of motion. Neck supple.  Cardiovascular: RRR without murmur. No JVD     . Respiratory: CTA Bilaterally without wheezes or rales. Normal effort       GI: BS +, non-tender, non-distended   Musculoskeletal: She exhibits trace RLE edema --stable Neurological: She is alertand oriented to person, place, and time.  Speech  fluid and automatic  Decreased LT right leg.    RUE   3 to 3+/5 deltoid, biceps, triceps, wrist, HI. RLE: 1+ to 2/5 HAD/HF, 1+/5 distally   Skin: Skin is warmand dry.   Psychiatric: up beat and smiling. .   Assessment/Plan: 1. 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 Physiatrist is providing close team supervision and 24 hour management of active medical problems listed below. Physiatrist and rehab team continue to assess barriers to discharge/monitor patient progress toward functional and medical goals.  Function:  Bathing Bathing position   Position: Wheelchair/chair at sink  Bathing parts Body parts bathed by patient: Chest, Abdomen, Front perineal area, Right upper leg, Left upper leg Body parts bathed by helper: Right arm, Left arm, Buttocks, Right lower leg, Left lower leg, Back  Bathing assist Assist Level:  (ModA)      Upper Body Dressing/Undressing Upper body dressing   What is the patient wearing?: Pull over shirt/dress     Pull over shirt/dress - Perfomed by patient: Thread/unthread left sleeve, Put head through opening Pull over shirt/dress - Perfomed by helper: Thread/unthread right sleeve, Pull shirt over trunk        Upper body assist Assist Level:  (ModA)   Set up : To obtain clothing/put away  Lower Body Dressing/Undressing Lower body dressing   What is the patient wearing?: Non-skid slipper socks, Underwear, Pants Underwear - Performed by patient: Thread/unthread left underwear leg Underwear - Performed by helper: Thread/unthread right underwear leg, Pull underwear up/down, Thread/unthread left underwear leg Pants- Performed by patient: Thread/unthread left pants leg Pants- Performed by helper: Thread/unthread right pants leg, Pull pants up/down, Thread/unthread left pants leg Non-skid slipper socks- Performed by patient: Don/doff right sock, Don/doff left sock Non-skid slipper socks- Performed by helper: Don/doff right sock, Don/doff left  sock               TED Hose - Performed by helper: Don/doff right TED hose, Don/doff left TED hose  Lower body assist Assist for lower body dressing:  (MaxA)      Toileting Toileting   Toileting steps completed by patient: Performs perineal hygiene Toileting steps  completed by helper: Adjust clothing prior to toileting, Adjust clothing after toileting Toileting Assistive Devices: Grab bar or rail  Toileting assist Assist level:  (MaxA)   Transfers Chair/bed transfer   Chair/bed transfer method: Squat pivot Chair/bed transfer assist level: Maximal assist (Pt 25 - 49%/lift and lower) Chair/bed transfer assistive device: Armrests Mechanical lift: [name]   Locomotion Ambulation     Max distance: 12' Assist level: Touching or steadying assistance (Pt > 75%)   Wheelchair   Type: Manual Max wheelchair distance: 15' Assist Level: Moderate assistance (Pt 50 - 74%)  Cognition Comprehension Comprehension assist level: Follows complex conversation/direction with extra time/assistive device  Expression Expression assist level: Expresses complex ideas: With extra time/assistive device  Social Interaction Social Interaction assist level: Interacts appropriately with others - No medications needed.  Problem Solving Problem solving assist level: Solves complex problems: With extra time  Memory Memory assist level: Recognizes or recalls 75 - 89% of the time/requires cueing 10 - 24% of the time   Medical Problem List and Plan: 1. Functional deficits and right hemiparesissecondary to metastatic lung cancer to the brain. -CT :increased edema at site of residual tumor, left-fronto-parietal area        -continue decadron iv 8mg  q6---change to oral next week?     -oncology/rad-onc have seen patient. Appreciate their care and follow up  -pt for simulation closer to treatment date of 11/8  -pt/family up to date with plan.         -pt is demonstrating neurological improvement with decadron   2. DVT left gastroc: Mechanical: Sequential compression devices, below knee Bilateral lower extremities, IVC filter in place.   - continjue elevation of legs when resting, compression stockings, etc  3. Headaches/Pain Management: Headaches persistent and being  managed with prn hydocodone.  4. Mood: LCSW to follow for evaluation and support.  5. Neuropsych: This patient iscapable of making decisions on herown behalf. 6. Skin/Wound Care: routine pressure relief measures.  --staples out 7. Fluids/Electrolytes/Nutrition/dysphagia: Monitor I/O. BUN   normal 13  -SLP to follow up re: safety with diet  -D2 thins currently  -potassium 4.3 on 10/22---continue supplement--- 8. Nausea: Likely central.  Some improvement.  9. H/o A Fib: Managed with diltiazem. Monitor HR bid. HR controlled 10/19 Vitals:   01/27/17 2100 01/12/2017 0408  BP: (!) 142/48 (!) 144/40  Pulse:  66  Resp:  18  Temp:  97.6 F (36.4 C)  SpO2:  97%   10. Emphysema/Bronchiectasis: continue nebulizers.  chest PT bid as at home. Continue oxygen at nights and prn during the day.  11. Leucocytosis: resolved   -likely steroid related   -6.4 10/15  -follow up next week 12. Constipation:  Moving bowels    -use prn miralax  -encourage fruits/fiber  -encourage PO fluids 13.  GERD-  Protonix  Increased to 40mg  bid with improvement 14. Dysuria; check ucx   LOS (Days) 18 A FACE TO FACE EVALUATION WAS PERFORMED  Meredith Staggers, MD 01/09/2017 9:59 AM

## 2017-01-28 NOTE — H&P (Signed)
Reason for ICU admission: cardiac arrest  In summary: 76 y.o. female with past Hx of atrial fibrillation, COPD as well as history of smoking but quit more than 35 years ago. The patient was diagnosed with a stage IB (T2, N0, M0) non-small cell lung cancer status post right upper lobectomy at [facility]. The patient was admitted to [facility] on 12/26/2016 com several weeks of right sided numbness and weakness as well as jerking movement in the right arm. CT scan of the head followed by MRI of the brain on 12/26/2016 showed enhancing mass lesion in the high left frontal parietal lobe measuring 3.8 x 2.8 x 4.2 cm. The mass with surrounding by vasogenic edema. On 12/31/2016 she underwent left frontal craniotomy with resection of the brain tumor. He final pathology was consistent with metastatic carcinoma favoring lung primary. Repeat CT scan of the head without contrast on 01/23/2017 showed sequela of the prior left parietal craniotomy for tumor resection suspected residual tumor at the left frontoparietal junction with worsened localized vasogenic edema as compared to the previous MRI.  I have been called bedside for PEA cardiac arrest. ACLS was started and after 4 rounds of CPR, ROSC archived, however the patient coded again and ACLS restarted and after 3 rounds of CPR, ROSC archived. Patient was severely hypotensive and required to be on norepi, epi and vaso to maintain hemodynamic stability.The family were bedside and update almost immediately.   Patient transferred to the ICU and emergent central and arterial lines inserted while resuscitating the patient. Patient is not candidate for hypothermia given her metastatic lung cancer  STAT labs, EKG and CXR ordered and pending  Patient is critically ill in the ICU and I am managing the patient for the following:  Neuro: unresponsive Postcardiac arrest: will maintain normothermia, too early for prognostications. S/p crani for metastatic  lung cancer: repeat head CT remain positive for residual tumor, will keep her on keppra Analgesia: prn fentanyl  Resp: diminished on the bases. CXR showed evidence of SQ emphysema and possible PTHx. The devices been adjusted Acute hypercapnic resp failure: increase minute ventilation and recheck ABGs Hx of COPD: dua-neb and IV steroids  CV: cold, mottled, detected peripheral pulses Shock: cardiogenic, ? Obstructive, septic. Aggressive fluid resuscitation, start bicarb drip to correct metabolic acidosis, on norepi, epi and vaso. Send cultures, board abx, stress dose of steroids. Check troponin and ECHO to r/o massive PE. Patient unstable for CTA. Follow lactic acid, SCVO2 trend Hx of a fib: if needed will use amiodarone  GI: abdomen distended NGT to LIS  GU: AKI: will need CRRT Severe metabolic acidosis: fluid resuscitation and follow lactic acid. Check ketones  ID: septic shock Send cultures, zyvox and imipenem, stress dose steroids  Hem: Check coag profile, will transfuse if Hb <7 SQH for DVT prophylaxis   I spoke to the husband and he understands the critical condition of the patient. The patient wishes are to have everything done but she does not want to be kept on life support measures for extended period of time. Will keep updating the family  I spent 78 min of CC time managing the patient exclusive of any procedures   Samul Dada, MD CCM attending

## 2017-01-28 NOTE — Progress Notes (Signed)
Physical Therapy Session Note  Patient Details  Name: Tracy Bridges MRN: [id number] Date of Birth: 08-14-1940  Today's Date: 01/20/2017 PT Individual Time: 1345-1445 PT Individual Time Calculation (min): 60 min   Short Term Goals: Week 3:  PT Short Term Goal 1 (Week 3): = LTGs - LOS adjusted due to change in medical status  Skilled Therapeutic Interventions/Progress Updates:   Session focused on family education with pt's husband Tracy Bridges in regards to simulated car transfer, w/c parts management, positioning in the w/c, bumping up w/c up/down 1 step for home entry, and education on pressure relief. Husband able to return demonstrate transfers (stand pivot technique) with max assist - cues for body mechanics and hand placement. Demonstrated and husband return demonstrated technique to bump w/c up/down one step and again cues for body mechanics. Discussed energy conservation, use of hospital bed features to increase pt's independence, and allowing time for pt to attempt prior to husband providing lifting assist. Will continue to benefit from hands on training until d/c next week. Family planning on putting a ramp in and making home modifications to allow for increased accessibility.   Therapy Documentation Precautions:  Precautions Precautions: Fall Precaution Comments: claustrophobia; R hemi (increasing weakness 10/22) Restrictions Weight Bearing Restrictions: No   Pain:  Denies pain.    See Function Navigator for Current Functional Status.   Therapy/Group: Individual Therapy  [name], PT, DPT  01/04/2017, 3:16 PM

## 2017-01-28 NOTE — Progress Notes (Addendum)
Code blue called at approximately 4:30. Arrived and patient had a pulse and was communicating. She was having a bowel movement stood up and subsequently synopsized. She did not hit her head. She was initially bradycardic and hypotensive. She was moved to the bed and fluids were started. Her HR and BP subsequently improved to baseline. She was in A-fib per monitoring. She described a warm flush feeling prior to the event. Dr. Cathleen Corti was notified of the event.   Ina Homes, MD [phone]

## 2017-01-28 NOTE — Progress Notes (Signed)
Pt with unresponsive episode in bathroom. Came to after being lowered to floor. BP's in 07'P systolic. Code team arrived on floor and began IVF. EKG reviewed and shows a-fib in 110's. I asked hospitalist to see the patient. Upon his arrival, BP had begun to improve and HR back to 90's. Pt is comfortable. Will continue IVF for now. Labs ordered. Continue to monitor the patient closely. Hold cardizem this evening. Likely vaso-vagal event  Meredith Staggers, MD, [facility] 01/09/2017

## 2017-01-28 NOTE — Progress Notes (Signed)
CRITICAL VALUE ALERT  Critical Value:  K-6.8, cbg-625, trop-0.38, lactic acid-19.9  Date & Time Notied:  01/13/2017 @ 2150  Provider Notified: Dr. Rollen Sox  Orders Received/Actions taken: MD ordered received and initiated.

## 2017-01-28 NOTE — Progress Notes (Signed)
Pt. Not in room at this time. Fran Lowes, RN VAST

## 2017-01-28 NOTE — Plan of Care (Signed)
Problem: RH Bed to Chair Transfers Goal: LTG Patient will perform bed/chair transfers w/assist (PT) LTG: Patient will perform bed/chair transfers with assistance, with/without cues (PT).  Downgraded due to inconsistency ABG

## 2017-01-28 NOTE — Progress Notes (Signed)
Pharmacy Antibiotic Note  Tracy Bridges is a 76 y.o. female admitted on 01/27/2017 with sepsis.  Pharmacy has been consulted for meropenem dosing.  S/p CPR and ROSC, transferred to 16M. Has multiple abx allergies. MD ok with carbapenem, has not had in the past. Scr 1.24, CrCl ~48ml/min  Plan: Start meropenem 1g IV Q12h Start linezolid 600mg  IV Q12h per MD Monitor clinical picture, renal function, any s/s of allergic reaction F/U C&S, abx deescalation / LOT     Temp (24hrs), Avg:97.7 F (36.5 C), Min:97.6 F (36.4 C), Max:97.7 F (36.5 C)   Recent Labs Lab 01/24/17 2155 01/08/2017 1647  WBC 5.1 12.2*  CREATININE 1.00 1.24*    Estimated Creatinine Clearance: 38.6 mL/min (A) (by C-G formula based on SCr of 1.24 mg/dL (H)).    Allergies  Allergen Reactions  . Aspirin Shortness Of Breath  . Atenolol Swelling    Throat swelled and closed up  . Boniva [Ibandronic Acid] Other (See Comments)    Pt does not remember specific reaction  . Fluticasone Anaphylaxis and Hives  . Fosamax [Alendronate Sodium] Other (See Comments)    Pt does not remember specific reaction  . Latex Swelling and Rash    Throat swelled and closed up  . Mucinex [Guaifenesin Er] Anaphylaxis  . Rocephin [Ceftriaxone Sodium In Dextrose] Anaphylaxis and Rash  . Rotateq [Rotavirus Vaccine Live Oral] Other (See Comments)    "poorly tolerated"  . Sulfa Antibiotics Other (See Comments)    Pt does not remember specific reaction  . Vancomycin Other (See Comments)    Pt does not remember specific reaction  . Senokot S [Sennosides-Docusate Sodium]     Throat swelling  . Penicillins Rash    Childhood reaction Has patient had a PCN reaction causing immediate rash, facial/tongue/throat swelling, SOB or lightheadedness with hypotension: Yes Has patient had a PCN reaction causing severe rash involving mucus membranes or skin necrosis: No Has patient had a PCN reaction that required hospitalization: Unknown Has patient  had a PCN reaction occurring within the last 10 years: No If all of the above answers are "NO", then may proceed with Cephalosporin use.  . Tobramycin Sulfate Rash     Thank you for allowing pharmacy to be a part of this patient's care.  Reginia Naas 01/08/2017 9:17 PM

## 2017-01-28 NOTE — Progress Notes (Signed)
Occupational Therapy Weekly Progress Note  Patient Details  Name: Tracy Bridges MRN: [id number] Date of Birth: May 28, 1940  Beginning of progress report period: 01/20/17 End of progress report period: 01/22/2017  Today's Date: 01/13/2017 OT Individual Time: 6269-4854 OT Individual Time Calculation (min): 84 min   Patient has met 0 of 3 short term goals.    Pts progress has been limited during this report period due to medical decline. Since new onset of edema around tumor site, pt has presented with increased Rt hemiparesis, weakness, and fatigue, resulting in overall BADL/functional transfer level decline compared to previous week. Goals have been downgraded as appropriate and family training has been initiated. Will continue to focus on stated deficits and family education (including energy conservation when radiation begins), in order for pt to safely transition home at time of d/c.    Patient continues to demonstrate the following deficits: muscle weakness and muscle paralysis, decreased cardiorespiratoy endurance, impaired timing and sequencing, abnormal tone, unbalanced muscle activation and decreased coordination and decreased sitting balance, decreased standing balance, decreased postural control, hemiplegia and decreased balance strategies and therefore will continue to benefit from skilled OT intervention to enhance overall performance with BADL.  Patient progressing toward long term goals..  Continue plan of care.  OT Short Term Goals Week 2:  OT Short Term Goal 1 (Week 2): Pt will complete UB dressing with Min A OT Short Term Goal 1 - Progress (Week 2): Progressing toward goal OT Short Term Goal 2 (Week 2): Pt will consistently complete sit<stand during LB dressing with Mod A OT Short Term Goal 3 (Week 2): Pt will complete 1/3 components of toileting  OT Short Term Goal 3 - Progress (Week 2): Progressing toward goal Week 3:  OT Short Term Goal 1 (Week 3): Pt will complete UB  dressing with Min A OT Short Term Goal 2 (Week 3): Pt will complete 1/3 components of toileting  OT Short Term Goal 3 (Week 3): Pt will consistenty complete sit<stand during LB dressing with Mod A OT Short Term Goal 4 (Week 3): Pt will complete toilet transfer with Mod A and LRAD  Skilled Therapeutic Interventions/Progress Updates:    Tx focus on Rt NMR, activity tolerance, functional transfers, and ADL retraining.   Pt greeted EOB, awaiting breakfast. Pt completing self feeding using R UE and built up utensils. HOH guidance for bringing food to mouth due to proximal weakness. Bilateral integration for managing beverages and active assist from OT. Afterwards pt reported wanting to try to void. Stand pivot with Max A<w/c<elevated toilet. Pt unable to move R LE during transfers. Pt requiring Total A for toileting tasks after successful B+B. Max A for sit<stand with RW for hygiene mgt. Also changed LB clothing with pt requiring Max A, needed UE assist to elevate R LE into pants. Able to don Lt gripper sock with bilateral UEs! Once she returned to w/c, pt very fatigued. Required prolonged seated rest break before completing hand washing/oral care/hair brushing w/c level at sink. At end of tx pt was left in w/c with all needs within reach.   Therapy Documentation Precautions:  Precautions Precautions: Fall Precaution Comments: claustrophobia; R hemi (increasing weakness 10/22) Restrictions Weight Bearing Restrictions: No Pain: Pain Assessment Pain Assessment: No/denies pain ADL:      See Function Navigator for Current Functional Status.   Therapy/Group: Individual Therapy  Myliah Medel A Oviya Ammar 01/15/2017, 12:25 PM

## 2017-01-28 NOTE — Procedures (Signed)
Central line insertion  Rt IJ central line  Emergent procedure, full sterile  Under US guidance Rt IJ central line inserted, from first attempt no complications CXR reviewed  Samul Dada, MD CCM attending

## 2017-01-28 NOTE — Progress Notes (Signed)
Physical Therapy Session Note  Patient Details  Name: Tracy Bridges MRN: [id number] Date of Birth: Oct 09, 1940  Today's Date: 01/27/2017 PT Individual Time: 1005-1100 PT Individual Time Calculation (min): 55 min   Short Term Goals: Week 3:  PT Short Term Goal 1 (Week 3): = LTGs - LOS adjusted due to change in medical status  Skilled Therapeutic Interventions/Progress Updates:    Session focused on functional transfers with various techniques and w/c mobility within in the room to negotiate for transfers. Sit <> stand retraining with mod assist overall and cues for technique and hand placement - requires 3 attempts before getting into standing. Added R hand orthosis to RW to maintain RUE support with pt able to place, but PT to fasten with velcro strap. Pt declines attempting to step forward with RLE due to feeling like it is going to buckle - able to weightshift to bring LLE forward and backwards x 2 with PT blocking R knee. Squat pivot transfer training with pt directing 50% of the time with max assist for lift and lower from w/c <> bed and cues for hand placement. Educated on trial with slideboard to decrease burden, but pt declining to attempt still. Planning for husband to engage in family education this PM and will discuss car transfer options and ideally practice.   Therapy Documentation Precautions:  Precautions Precautions: Fall Precaution Comments: claustrophobia; R hemi (increasing weakness 10/22) Restrictions Weight Bearing Restrictions: No   Pain: Pain Assessment Pain Assessment: No/denies pain    See Function Navigator for Current Functional Status.   Therapy/Group: Individual Therapy  [name], PT, DPT  01/24/2017, 12:06 PM

## 2017-01-28 NOTE — Procedures (Signed)
Arterial Catheter Insertion Procedure Note Tracy Bridges [id number] 03-Aug-1940  Procedure: Insertion of Arterial Catheter  Indications: Blood pressure monitoring and Frequent blood sampling  Procedure Details Consent: Unable to obtain consent because of emergent medical necessity. Time Out: Verified patient identification, verified procedure, site/side was marked, verified correct patient position, special equipment/implants available, medications/allergies/relevent history reviewed, required imaging and test results available.  Performed  Maximum sterile technique was used including antiseptics, cap, gloves, gown, hand hygiene, mask and sheet. Skin prep: Chlorhexidine; local anesthetic administered 20 gauge catheter was inserted into left radial artery using the Seldinger technique.  Evaluation Blood flow good; BP tracing good. Complications: No apparent complications.   Tracy Bridges 01/13/2017

## 2017-01-28 NOTE — Consult Note (Signed)
Medical Consultation   Tracy Bridges  ZOX:[id number]  DOB: [birth year]  DOA: 01/10/2017  PCP: Sherrilee Gilles, DO   Requesting physician: Dr. Naaman Plummer  Reason for consultation: Hypotension  History of Present Illness: Tracy Bridges is an 76 y.o. female with past medical history of non-small cell lung cancer status post right lung resection, recent diagnosis of metastatic brain lesion consistent with lung cancer status post craniotomy and resection of her tumor on 12/30/2016, chronic atrial fibrillation not on anticoagulation, recent diagnosis of left lower extremity DVT involving the gastrocnemius veins status post IVC filter, COPD who been currently in inpatient rehabilitation unit after her craniotomy. Hospitalist service was called to evaluate the patient because her blood pressure dropped into the 60s this afternoon a little while ago. As per the patient's husband and nursing staff, patient was in the bathroom using the commode and apparently passed out for a little while; blood pressure systolic was in the 81X. There was no witnessed seizure episodes or bowel bladder incontinence. Patient was brought back to the bed and IV fluids were started. Patient currently denies any chest pain, increasing shortness of breath, recent fever or vomiting. Patient states that she felt a little nauseous after the event. Patient never lost pulse. She complains of some anxiety. No complains of increasing cough, abdominal pain, diarrhea. Apparently she's been tolerating her physical therapy well. Oncology and neurosurgery have been following the patient and plan is for patient to start radiation soon   Review of Systems:  ROS As per HPI otherwise 10 point review of systems negative.   Past Medical History: Past Medical History:  Diagnosis Date  . Asthma   . Atrial fibrillation ([place])   . Emphysema/COPD ([place])   . Lung cancer [facility])     Past Surgical History: Past Surgical History:  Procedure  Laterality Date  . ABDOMINAL HYSTERECTOMY    . APPLICATION OF CRANIAL NAVIGATION Left 12/30/2016   Procedure: APPLICATION OF CRANIAL NAVIGATION;  Surgeon: Ashok Pall, MD;  Location: [place];  Service: Neurosurgery;  Laterality: Left;  APPLICATION OF CRANIAL NAVIGATION  . CESAREAN SECTION    . CRANIOTOMY Left 12/30/2016   Procedure: CRANIOTOMY TUMOR EXCISION;  Surgeon: Ashok Pall, MD;  Location: [place];  Service: Neurosurgery;  Laterality: Left;  CRANIOTOMY TUMOR EXCISION  . HERNIA REPAIR    . IR IVC FILTER PLMT / S&I /IMG GUID/MOD SED  01/10/2017  . IR IVC FILTER REPOSITION / S&I /IMG GUID/MOD SED  01/10/2017  . LUNG REMOVAL, PARTIAL  04/1997     Allergies:   Allergies  Allergen Reactions  . Aspirin Shortness Of Breath  . Atenolol Swelling    Throat swelled and closed up  . Boniva [Ibandronic Acid] Other (See Comments)    Pt does not remember specific reaction  . Fluticasone Anaphylaxis and Hives  . Fosamax [Alendronate Sodium] Other (See Comments)    Pt does not remember specific reaction  . Latex Swelling and Rash    Throat swelled and closed up  . Mucinex [Guaifenesin Er] Anaphylaxis  . Rocephin [Ceftriaxone Sodium In Dextrose] Anaphylaxis and Rash  . Rotateq [Rotavirus Vaccine Live Oral] Other (See Comments)    "poorly tolerated"  . Sulfa Antibiotics Other (See Comments)    Pt does not remember specific reaction  . Vancomycin Other (See Comments)    Pt does not remember specific reaction  . Senokot S [Sennosides-Docusate Sodium]  Throat swelling  . Penicillins Rash    Childhood reaction Has patient had a PCN reaction causing immediate rash, facial/tongue/throat swelling, SOB or lightheadedness with hypotension: Yes Has patient had a PCN reaction causing severe rash involving mucus membranes or skin necrosis: No Has patient had a PCN reaction that required hospitalization: Unknown Has patient had a PCN reaction occurring within the last 10 years: No If all of the  above answers are "NO", then may proceed with Cephalosporin use.  . Tobramycin Sulfate Rash     Social History:  reports that she quit smoking about 38 years ago. Her smoking use included Cigarettes. She has never used smokeless tobacco. She reports that she does not drink alcohol or use drugs.   Family History: Family History  Problem Relation Age of Onset  . Liver cancer Other   . Heart disease Other   . Brain cancer Neg Hx       Physical Exam: Vitals:   01/27/17 2100 01/05/2017 0408 01/10/2017 0500 01/16/2017 1532  BP: (!) 142/48 (!) 144/40  132/68  Pulse:  66  85  Resp:  18  16  Temp:  97.6 F (36.4 C)  97.7 F (36.5 C)  TempSrc:  Oral  Oral  SpO2:  97%  97%  Weight:   73.1 kg (161 lb 3.2 oz)   Height:        Constitutional: Elderly female lying in bed, slightly ill-looking, awake and answers questions  Head: well healing scar on the left parietal scalp. Eyes: PERLA, no icterus or pallor ENT: no posterior pharyngeal wall erythema  Neck: neck appears normal, no masses, normal ROM, no thyromegaly, no JVD  CVS: S1 and S2 positive, mildly tachycardic intermittently Respiratory:  Bilateral decreased breath sounds at bases similar; No wheezing. No tachypnea.  Abdomen: soft nontender, nondistended, normal bowel sounds, no hepatosplenomegaly, no hernias  Musculoskeletal: : no cyanosis, clubbing ; trace lower extremity edema ; no obvious joint deformity  Neuro: Alert and awake, moving extremities.  Lymph: No cervical lymphadenopathy Skin: no other rashes or lesions or ulcers  Data reviewed:  I have personally reviewed following labs and imaging studies Labs:  CBC:  Recent Labs Lab 01/24/17 2155 01/27/2017 1647  WBC 5.1 12.2*  NEUTROABS 4.7  --   HGB 11.7* 11.5*  HCT 36.0 36.3  MCV 93.5 94.0  PLT 249 244    Basic Metabolic Panel:  Recent Labs Lab 01/24/17 2155 01/06/2017 1647  NA 138 136  K 4.3 5.4*  CL 101 107  CO2 27 17*  GLUCOSE 200* 311*  BUN 19 55*    CREATININE 1.00 1.24*  CALCIUM 9.1 8.7*   GFR Estimated Creatinine Clearance: 38.6 mL/min (A) (by C-G formula based on SCr of 1.24 mg/dL (H)). Liver Function Tests: No results for input(s): AST, ALT, ALKPHOS, BILITOT, PROT, ALBUMIN in the last 168 hours. No results for input(s): LIPASE, AMYLASE in the last 168 hours. No results for input(s): AMMONIA in the last 168 hours. Coagulation profile No results for input(s): INR, PROTIME in the last 168 hours.  Cardiac Enzymes: No results for input(s): CKTOTAL, CKMB, CKMBINDEX, TROPONINI in the last 168 hours. BNP: Invalid input(s): POCBNP CBG:  Recent Labs Lab 01/21/2017 1643  GLUCAP 277*   D-Dimer No results for input(s): DDIMER in the last 72 hours. Hgb A1c No results for input(s): HGBA1C in the last 72 hours. Lipid Profile No results for input(s): CHOL, HDL, LDLCALC, TRIG, CHOLHDL, LDLDIRECT in the last 72 hours. Thyroid function studies No  results for input(s): TSH, T4TOTAL, T3FREE, THYROIDAB in the last 72 hours.  Invalid input(s): FREET3 Anemia work up No results for input(s): VITAMINB12, FOLATE, FERRITIN, TIBC, IRON, RETICCTPCT in the last 72 hours. Urinalysis    Component Value Date/Time   COLORURINE YELLOW 01/08/2017 0716   APPEARANCEUR HAZY (A) 01/08/2017 0716   LABSPEC 1.021 01/08/2017 0716   PHURINE 7.0 01/08/2017 0716   GLUCOSEU NEGATIVE 01/08/2017 0716   HGBUR NEGATIVE 01/08/2017 0716   BILIRUBINUR NEGATIVE 01/08/2017 0716   [place] NEGATIVE 01/08/2017 0716   PROTEINUR NEGATIVE 01/08/2017 0716   NITRITE NEGATIVE 01/08/2017 0716   LEUKOCYTESUR NEGATIVE 01/08/2017 0716     Microbiology No results found for this or any previous visit (from the past 240 hour(s)).     Inpatient Medications:   Scheduled Meds: . arformoterol  15 mcg Nebulization BID  . dexamethasone  8 mg Intravenous Q6H  . insulin aspart  0-15 Units Subcutaneous TID WC  . insulin aspart  0-5 Units Subcutaneous QHS  . levETIRAcetam   500 mg Oral BID  . LORazepam  0.5 mg Oral Once  . nystatin   Topical TID  . pantoprazole  40 mg Oral BID   Continuous Infusions: . sodium chloride    . sodium chloride       Radiological Exams on Admission: No results found.  Impression/Recommendations Principal Problem:   Metastatic cancer to brain [facility]) Active Problems:   Atrial fibrillation (H[facility])   Emphysema/COPD ([place])   CKD (chronic kidney disease), stage II   Right sided weakness   History of lung cancer   Vascular headache   Leukocytosis   Prerenal azotemia   Right foot pain   Thrombocytopenia (HCC)   Acute blood loss anemia   Hypokalemia  Hypotension - Probably from vasovagal event. Blood pressure improving with bolus intravenous fluids. Give normal saline to 250 mL bolus more and patient can be continued on maintenance IV fluids for tonight. - Patient does not appear to be septic. We will still order blood cultures, urinalysis with culture and chest x-ray. - Mild leukocytosis probably can be secondary to steroid use. Monitor off antibiotics. Repeat a.m. labs - Hold Cardizem for tonight - We will monitor the patient's blood pressure closely and for now patient can remain in the inpatient observation unit but if patient persistently remains hypotensive, consider transferring the patient to inpatient.  Syncope probably from vasovagal event - Monitor.  - Stat CAT scan of the brain to rule out any worsening brain edema. - Monitor neurological status   Mild leukocytosis - Repeat a.m. labs. Probably reactive from steroids. Monitor off antibiotics  Hyperglycemia - Probably steroid induced. Accu-Cheks with insulin sliding scale coverage. Check hemoglobin A1c  Metastatic lung cancer to the brain status post recent craniotomy - Patient being followed by neurosurgery and oncology. - Patient is currently on Decadron as per neurosurgery  COPD - Stable. Oxygen supplementation if needed. Duonebs as needed  Chronic  atrial fibrillation - Hold Cardizem dose for tonight. Restart once blood pressure improves - Not on anticoagulation probably because of recent brain surgery  History of recent left lower extremity DVT of the gastrocnemius vein status post IVC filter  Thank you for this consultation.  Our [facility] hospitalist team will follow the patient with you.    Aline August M.D. [facility] Pager: [phone] 01/24/2017, 5:49 PM

## 2017-01-28 NOTE — Significant Event (Signed)
  Patient Name: Tracy Bridges   MRN: [id number]   Date of Birth/ Sex: 16-Apr-1940 , female      Admission Date: 01/10/2017  Attending Provider: Meredith Staggers, MD  Primary Diagnosis: Metastatic cancer to brain [facility])   Indication: Pt was in her usual state of health until this PM at approximately 7:15pm, when she was noted to be unresponsive on her way back from CT. Code blue was subsequently called. At the time of arrival on scene, ACLS protocol was underway.   Technical Description:  - CPR performance duration:  30 minutes  - Was defibrillation or cardioversion used? No   - Was external pacer placed? No  - Was patient intubated pre/post CPR? Yes   Medications Administered: Y = Yes; Blank = No Amiodarone    Atropine    Calcium  Y  Epinephrine  Y  Lidocaine    Magnesium    Norepinephrine    Phenylephrine    Sodium bicarbonate  Y  Vasopressin     Post CPR evaluation:  - Final Status - Was patient successfully resuscitated ? Yes - What is current rhythm? Wide tachycardia - What is current hemodynamic status? BP 90s/40s  Miscellaneous Information:  - Labs sent, including: Recent labs drawn  - Primary team notified?  Yes  - Family Notified? Yes  - Additional notes/ transfer status: Discussed with Critical Care at bedside. Transfer to MICU.     Colbert Ewing, MD  01/15/2017, 7:54 PM

## 2017-01-28 NOTE — Progress Notes (Signed)
I spoke with the husband and explained to him that patient is on maximum medical support and it is very likely to have another cardiac arrest tonight. He does not want CPR to be initiated. So the patient is DNR for now.  [place] attending

## 2017-01-28 NOTE — Progress Notes (Signed)
ETT advanced 3CM per MD order, patient tolerated well with no adverse reactions. BBS confirmed.    ABG critical values reported to MD.

## 2017-01-29 ENCOUNTER — Inpatient Hospital Stay (HOSPITAL_COMMUNITY): Payer: Medicare HMO

## 2017-01-30 LAB — URINE CULTURE: CULTURE: NO GROWTH

## 2017-01-31 ENCOUNTER — Inpatient Hospital Stay (HOSPITAL_COMMUNITY): Payer: Medicare HMO

## 2017-01-31 ENCOUNTER — Inpatient Hospital Stay (HOSPITAL_COMMUNITY): Payer: Medicare HMO | Admitting: Occupational Therapy

## 2017-01-31 MED FILL — Medication: Qty: 1 | Status: AC

## 2017-02-01 ENCOUNTER — Ambulatory Visit: Payer: Medicare HMO | Admitting: Radiation Oncology

## 2017-02-01 ENCOUNTER — Ambulatory Visit: Admit: 2017-02-01 | Payer: Medicare HMO

## 2017-02-01 NOTE — Discharge Summary (Signed)
Physician Discharge Summary   Patient ID: Tracy Bridges MRN: [id number] DOB/AGE: Jun 04, 1940 76 y.o.  Admit date: 01/07/2017 Discharge date: 01/09/17  Discharge Diagnoses:  Principal Problem:   Right sided weakness Active Problems:   Atrial fibrillation (HCC)   Emphysema/COPD (HCC)   Metastatic cancer to brain [facility])   Discharged Condition:  Stable   Significant Diagnostic Studies:   Labs:  Basic Metabolic Panel: BMP Latest Ref Rng & Units 01/11/2017 01/30/2017 01/24/2017  Glucose 65 - 99 mg/dL 625(HH) 311(H) 200(H)  BUN 6 - 20 mg/dL 54(H) 55(H) 19  Creatinine 0.44 - 1.00 mg/dL 1.81(H) 1.24(H) 1.00  Sodium 135 - 145 mmol/L 139 136 138  Potassium 3.5 - 5.1 mmol/L 6.8(HH) 5.4(H) 4.3  Chloride 101 - 111 mmol/L 100(L) 107 101  CO2 22 - 32 mmol/L 12(L) 17(L) 27  Calcium 8.9 - 10.3 mg/dL 9.6 8.7(L) 9.1    CBC:  Recent Labs Lab 01/25/2017 1647 02/01/2017 2047  WBC 12.2* 18.7*  NEUTROABS  --  13.4*  HGB 11.5* 9.5*  HCT 36.3 31.9*  MCV 94.0 101.3*  PLT 298 237    CBG:  Recent Labs Lab 01/25/2017 1643 01/23/2017 1815 01/08/2017 2114 01/18/2017 2345  GLUCAP 277* 449* 253* 527*    Brief HPI:    Tracy S Jonesis a 76 y.o.femalewith history of A fib, emphysema, bronchiectasis who was admitted on 12/26/16 with abnormalRUE motor activity. Patient with 6 week history of progressive right sided weakness with foot drop, headaches and nausea.  CT head revealed large ovoid mass with edema and MRI brain showed solitary mass in left frontal parietal convexity but difficult to determine if intra-axial or extra axial with question of meningioma v/s malignancy. She was started on Keppra as well as steroids for edema control. . Eliquis placed on hold and patient transitioned to IV heparin.  She underwent craniotomy for tumor resection on 12/31/16 by Dr. Christella Noa. Pathology revealed metastatic lung cancer.  Patient with resultant right hemiplegia, lethargy, as well as deficits in processing and  following commands. CIR recommended due to deficits in mobility and ability to carry out ADL tasks   Hospital Course: Tracy Bridges was admitted to rehab 01/07/2017 for inpatient therapies to consist of PT, ST and OT at least three hours five days a week. She continued to have intermittent issues with nausea and headaches that was felt to be centrally related. Chest PT was resumed with home nebulizer schedule.   Therapy evaluations done revealing need for mod assist + 2 with ADLs and max assist with mobility. BLE dopplers done on 10/7 revealing acute intramuscular DVT in gastrocnemius vein of LLE. Radiology was consulted for placement of IVC filter due to recent CNS surgery. As they were unable to perform this till next day, patient's rehab stay was interrupted with transfer back to acute for bed rest and closer monitoring later that day.    Disposition: To acute floor.   Diet: Regular.   Medications at time of transfer:  1. Pulmicort nebulizer 0.5 mg bid. 2. Brovana nebulizer 15 mcg bid 3.Decadron 4 mg every 8 hours. 4. Oscal with Vitamin D daily 5. Cardizem CD 240 mg daily 6. Duonebs tid 7. Keppra 500 mg po bid 8. Meclizine 25 mg po tid 9. Multivitamin po daily 10. Senna S two pills po HS 11. Phenergan 12.5 mg po every 6 hours prn        Signed: Bary Leriche 02/01/2017, 7:42 PM

## 2017-02-02 ENCOUNTER — Telehealth (HOSPITAL_COMMUNITY): Payer: Self-pay

## 2017-02-02 LAB — CULTURE, BLOOD (ROUTINE X 2)
Culture: NO GROWTH
Culture: NO GROWTH
SPECIAL REQUESTS: ADEQUATE
Special Requests: ADEQUATE

## 2017-02-02 NOTE — Discharge Summary (Signed)
Physician Discharge Summary  Patient ID: BELLE CHARLIE MRN: [id number] DOB/AGE: 10-Feb-1941 76 y.o.  Admit date: 01/10/2017 Discharge date: 01/11/2017  Discharge Diagnoses:  Principal Problem:   Metastatic cancer to brain [facility]) Active Problems:   Atrial fibrillation ([place])   Emphysema/COPD ([place])   CKD (chronic kidney disease), stage II   Right sided weakness   History of lung cancer   Vascular headache   Leukocytosis   Prerenal azotemia   Right foot pain   Thrombocytopenia ([facility])   Acute blood loss anemia   Hypokalemia   Discharged Condition:  Guarded.   Significant Diagnostic Studies: Ct Head Wo Contrast  Addendum Date: 01/20/2017   ADDENDUM REPORT: 01/20/2017 20:15 ADDENDUM: These results were called by telephone at the time of interpretation on 01/06/2017 at 7:50 p.m. to Dr. Earnest Conroy. Naaman Plummer, who verbally acknowledged these results. Electronically Signed   By: Marin Olp M.D.   On: 01/09/2017 20:15   Result Date: 02/02/2017 CLINICAL DATA:  Syncopal episode today while in bathroom. History of brain tumor post craniotomy. History of lung cancer. EXAM: CT HEAD WITHOUT CONTRAST TECHNIQUE: Contiguous axial images were obtained from the base of the skull through the vertex without intravenous contrast. COMPARISON:  01/23/2017 and 12/29/2016 FINDINGS: Brain: Ventricles and cisterns are within normal. There is evidence of patient's previous left parieto-occipital craniotomy. There is an oval masslike focus of low attenuation over the surgical bed measuring 1.4 x 2.1 cm without significant change as cannot exclude residual tumor on this noncontrast study. There is a 4 mm focus of increased density along the superior anterior margin of the surgical bed as cannot exclude a small focus of acute hemorrhage. Left hemisphere white matter edema unchanged. Mild chronic ischemic microvascular disease. No significant mass effect or midline shift. Vascular: No hyperdense vessel or unexpected calcification.  Skull: Previous left posterior parietal/ occipital craniotomy. Sinuses/Orbits: No acute finding. Other: None. IMPRESSION: Postsurgical change compatible with previous left posterior parietal/ occipital craniotomy. Oval masslike area of low attenuation over the surgical bed measuring 1.4 x 2.1 cm without significant change as cannot exclude residual tumor. 4 mm hyperdense focus along the anterior superior margin of the surgical bed which may represent a focus of acute hemorrhage. Mild chronic ischemic microvascular disease. Electronically Signed: By: Marin Olp M.D. On: 01/09/2017 19:43   Ct Head Wo Contrast  Result Date: 01/23/2017 CLINICAL DATA:  Initial evaluation for generalized weakness. History of metastatic lung cancer. EXAM: CT HEAD WITHOUT CONTRAST TECHNIQUE: Contiguous axial images were obtained from the base of the skull through the vertex without intravenous contrast. COMPARISON:  Prior MRI from 12/30/2016. FINDINGS: Brain: Postoperative changes from prior left parietal craniotomy for tumor excision. Residual tumor or at the left frontal parietal junction is present, measuring approximately 2.9 cm (series 3, image 24). Exact measurements difficult on this noncontrast examination. Associated vasogenic edema with regional mass effect is increased relative to previous MRI. No significant midline shift. No hydrocephalus or ventricular trapping. No other appreciable metastatic lesions. No acute intracranial hemorrhage. No evidence for acute large vessel territory infarct. No extra-axial fluid collection. Vascular: No hyperdense vessel. Scattered vascular calcifications noted within the carotid siphons. Skull: Sequelae of prior craniotomy at the left parietal calvarium. Overlying skin staples noted. Sinuses/Orbits: Globes and orbital soft tissues within normal limits. Paranasal sinuses are largely clear. No mastoid effusion. Other: None. IMPRESSION: 1. Sequelae of prior left parietal craniotomy for  tumor resection. Suspected residual tumor at the left frontoparietal junction with worsened localized vasogenic edema as compared to  previous MRI. Associated regional mass effect without midline shift. 2. No other acute intracranial process identified. Electronically Signed   By: Jeannine Boga M.D.   On: 01/23/2017 23:50   Ir Ivc Filter Plmt / S&i /img Guid/mod Sed  Result Date: 01/10/2017 INDICATION: 76 year old female with history of DVT. 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 ANESTHESIA/SEDATION: 1.0 mg IV Versed; 50 mcg IV Fentanyl Moderate Sedation Time:  25 minutes The patient was continuously monitored during the procedure by the interventional radiology nurse under my direct supervision. FLUOROSCOPY TIME:  Fluoroscopy Time: 3 minutes 54 seconds (21.4 mGy). COMPLICATIONS: None PROCEDURE: The procedure, risks, benefits, and alternatives were explained to the patient. Specific risks discussed include bleeding, infection, contrast reaction, renal failure, IVC filter fracture, migration, ileo caval thrombus (3% incidence), need for further procedure, need for further surgery, pulmonary embolism, cardiopulmonary collapse, death. Questions regarding the procedure were encouraged and answered. The patient understands and consents to the procedure. Ultrasound survey was performed with images stored and sent to PACs. The neck was prepped with Betadine in a sterile fashion, and a sterile drape was applied covering the operative field. A sterile gown and sterile gloves were used for the procedure. Local anesthesia was provided with 1% Lidocaine. A micropuncture needle was used access the right internal jugular vein under ultrasound. With excellent venous blood flow returned, and an .018 micro wire was passed through the needle. Small incision was made with an 11 blade  scalpel. The needle was removed, and a micropuncture sheath was placed over the wire. The inner dilator and wire were removed, and an 035 Bentson wire was advanced under fluoroscopy into the IVC. Serial dilation of the soft tissue tract was performed with an 8 [place] dilator and subsequently a 10 [place] dilator. The delivery sheath for a retrievable Bard Denali filter was passed over the Bentson wire into the IVC. The wire was removed and small contrast was used to confirm IVC location. IVC cavagram performed. Dilator was removed, and the IVC filter was then delivered, with proximal migration such that the wall contact encroached upon the inflow from the right renal vein. A Bard retrieval kit was then passed over the guidewire for repositioning. The cap of the filter was easily captured with the gooseneck snare, and the filter was repositioned to a slightly lower position at the L3 level. Final position below the lowest renal vein. Repeat cavagram performed, and the catheter/wire/sheath was removed. Manual pressure was used for hemostasis. Patient tolerated the procedure well and remained hemodynamically stable throughout. No complications were encountered and no significant blood loss was encounter. IMPRESSION: Status post IVC filter placement via right IJ approach. Signed, Dulcy Fanny. Earleen Newport DO [facility] Radiology PLAN: This IVC filter is potentially retrievable. The patient can be assessed for filter retrieval by Interventional Radiology in approximately 8-12 weeks, or if the patient is a candidate for anti coagulation. Further recommendations regarding filter retrieval, continued surveillance or declaration of device permanence, will be made at that time. Electronically Signed   By: Corrie Mckusick D.O.   On: 01/10/2017 10:18   Dg Chest Port 1 View  Result Date: 01/26/2017 CLINICAL DATA:  Post CPR and Code line placement EXAM: PORTABLE CHEST 1 VIEW COMPARISON:   07/15/2013 FINDINGS: Endotracheal tube tip is about 4 cm superior to the carina. Esophageal tube tip appears below diaphragm but is not included. Right-sided  central venous catheter tip partially obscured by overlying leads, suspect that the tip overlies the distal SVC. Hyperinflation. Moderate soft tissue emphysema at the neck and left axilla. Heart size is nonenlarged.  Aortic atherosclerosis. There is hyper lucency at the right CP angle with possible faintly visible pleural line at the right apex. Old right-sided rib deformities. Postsurgical changes at the right suprahilar lung. Possible acute left second rib fracture. IMPRESSION: 1. Endotracheal tube tip about 4 cm superior to carina 2. Endotracheal tube tip extends below diaphragm but is not included on the chest radiograph 3. Large amount of soft tissue emphysema at the neck, supraclavicular regions, and left shoulder. 4. Right IJ central venous catheter tip overlies distal SVC. Hyperlucent right lung base with possible pleural line at the right apex, findings are concerning for a pneumothorax. 5. Possible displaced left second rib fracture. Critical Value/emergent results were called by telephone at the time of interpretation on 01/15/2017 at 9:48 pm to Dr. Rollen Sox, who verbally acknowledged these results. Electronically Signed   By: Donavan Foil M.D.   On: 01/11/2017 21:48   Dg Abd Portable 1v  Result Date: 01/31/2017 CLINICAL DATA:  Post code, OG line placement EXAM: PORTABLE ABDOMEN - 1 VIEW COMPARISON:  CT 12/31/2016 FINDINGS: Esophageal tube tip projects over proximal stomach, side-port projects over the distal esophagus. Dilated loops of small bowel, measuring up to 4.5 cm. Moderate stool in the right colon. IMPRESSION: 1. Esophageal tube tip overlies proximal stomach, side-port projects over distal esophagus, suggest further advancement for more optimal positioning 2. Slightly dilated loops of central small bowel, cannot exclude ileus or  obstruction. Electronically Signed   By: Donavan Foil M.D.   On: 01/25/2017 21:33   Dg Foot Complete Right  Result Date: 01/22/2017 CLINICAL DATA:  Injury EXAM: RIGHT FOOT COMPLETE - 3+ VIEW COMPARISON:  None. FINDINGS: Marked hallux valgus deformity.  Negative for fracture or erosion. IMPRESSION: No acute abnormality. Electronically Signed   By: Franchot Gallo M.D.   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 ANESTHESIA/SEDATION: 1.0 mg IV Versed; 50 mcg IV Fentanyl Moderate Sedation Time:  25 minutes The patient was continuously monitored during the procedure by the interventional radiology nurse under my direct supervision. FLUOROSCOPY TIME:  Fluoroscopy Time: 3 minutes 54 seconds (21.4 mGy). COMPLICATIONS: None PROCEDURE: The procedure, risks, benefits, and alternatives were explained to the patient. Specific risks discussed include bleeding, infection, contrast reaction, renal failure, IVC filter fracture, migration, ileo caval thrombus (3% incidence), need for further procedure, need for further surgery, pulmonary embolism, cardiopulmonary collapse, death. Questions regarding the procedure were encouraged and answered. The patient understands and consents to the procedure. Ultrasound survey was performed with images stored and sent to PACs. The neck was prepped with Betadine in a sterile fashion, and a sterile drape was applied covering the operative field. A sterile gown and sterile gloves were used for the procedure. Local anesthesia was provided with 1% Lidocaine. A micropuncture needle was used access the right internal jugular vein under ultrasound. With excellent venous blood flow returned, and an .018 micro wire  was passed through the needle. Small incision was made with an 11 blade scalpel. The needle was removed, and a micropuncture sheath was placed over the wire. The inner dilator  and wire were removed, and an 035 Bentson wire was advanced under fluoroscopy into the IVC. Serial dilation of the soft tissue tract was performed with an 8 [place] dilator and subsequently a 10 [place] dilator. The delivery sheath for a retrievable Bard Denali filter was passed over the Bentson wire into the IVC. The wire was removed and small contrast was used to confirm IVC location. IVC cavagram performed. Dilator was removed, and the IVC filter was then delivered, with proximal migration such that the wall contact encroached upon the inflow from the right renal vein. A Bard retrieval kit was then passed over the guidewire for repositioning. The cap of the filter was easily captured with the gooseneck snare, and the filter was repositioned to a slightly lower position at the L3 level. Final position below the lowest renal vein. Repeat cavagram performed, and the catheter/wire/sheath was removed. Manual pressure was used for hemostasis. Patient tolerated the procedure well and remained hemodynamically stable throughout. No complications were encountered and no significant blood loss was encounter. IMPRESSION: Status post IVC filter placement via right IJ approach. Signed, Dulcy Fanny. Earleen Newport DO [facility] Radiology PLAN: This IVC filter is potentially retrievable. The patient can be assessed for filter retrieval by Interventional Radiology in approximately 8-12 weeks, or if the patient is a candidate for anti coagulation. Further recommendations regarding filter retrieval, continued surveillance or declaration of device permanence, will be made at that time. Electronically Signed   By: Corrie Mckusick D.O.   On: 01/10/2017 10:18    Labs:  Basic Metabolic Panel:  Recent Labs Lab  01/04/2017 1647 01/12/2017 2047  NA 136 139  K 5.4* 6.8*  CL 107 100*  CO2 17* 12*  GLUCOSE 311* 625*  BUN 55* 54*  CREATININE 1.24* 1.81*  CALCIUM 8.7* 9.6    CBC:  Recent Labs Lab 01/14/2017 1647 01/03/2017 2047  WBC 12.2* 18.7*  NEUTROABS  --  13.4*  HGB 11.5* 9.5*  HCT 36.3 31.9*  MCV 94.0 101.3*  PLT 298 237    CBG:  Recent Labs Lab 01/18/2017 1643 01/31/2017 1815 01/05/2017 2114 01/16/2017 2345  GLUCAP 277* 449* 253* 527*    Updated HPI: Ximenna S Jonesis a 76 y.o.femalewith history of A fib, emphysema, bronchiectasis,  lung cancer who was admitted on 12/26/16 with abnormalRUE motor activity. Patient with 6 week history of progressive right sided weakness with foot drop, headaches and nausea.  Head CT revealed large ovoid mass in high left frontal lobe with vasogenic edema. MRI brain showed solitary mass in left frontal parietal convexity but difficult to determine if intra-axial or extra axial with question of meningioma v/s malignancy. She was started on Keppra as well as steroids. Eliquis placed on hold and patient transitioned to IV heparin. She underwent craniotomy for tumor resection on 12/31/16 by Dr. Christella Noa. Pathology revealed metastatic lung cancerand Dr. Alen Blew recommended outpatient follow up for input on treatment.  Patient with resultant right hemiplegia, lethargy, as well as deficits in processing and following commands. CIR recommended due to deficits in mobility and ability to carry out ADL tasks.   She was admitted to CIR on 01/07/17 after resection of the brain tumor. She was doing well. She developed lower leg pain and swelling. Vascular ultrasound showed DVT and IVC filter recommended due to recent CNS surgery. t. Per report, VIR advised transfer back to acute care and admitted to acute hospital at 1707 on 01/09/17. She underwent filter placement 01/10/17 and was deemed to be  stable post procedure. She continued to meet criteria for acute inpatient rehab stay  and was re-admitted back on 01/10/17 to complete CIR course due to interrupted stay. Marland Kitchen    Hospital Course: TORINA EY was admitted to rehab 01/10/2017 for inpatient therapies to consist of PT, ST and OT at least three hours five days a week. Past admission physiatrist, therapy team and rehab RN have worked together to provide customized collaborative inpatient rehab. Blood pressures were monitored on bid basis and have been stable. Her diet was advanced to dysphagia 2, thin liquids as nausea was improving and she reported hunger.  Labs revealed pre-renal azotemia and she was encouraged to increase fluid intake. Diet was slowly advanced to dysphagia 3 with close monitoring of lytes. Follow up labs revealed resolution of dehydration and reactive leucocytosis had resolved.  Her respiratory status was stable on home regimen of nebulizer swith chest PT to help mobilize secretions. She required max assist with mobility and ADL tasks. She exhibited mild cognitive deficits and dysphagia. Weekly team conferences were held to monitor progress, set goals and discuss barriers to discharge. Her insomnia was resolving and she was making steady progress during her rehab stay initially.  Bowel program was augmented to help manage constipation. She had intermittent issues with GERD and Prilosec was resumed with improvement in symptoms.  BLE edema was managed with elevation as well as compression stockings.  On 10/22, she developed increased right sided weakness with aphasia.  CT head done revealing increased edema at site of residual tumor and decadron was increased to 8 mg IV qid. Case discussed with Dr. Christella Noa who concurred with plan and felt changes were due to expected evolution.  SRS was recommended by Dr. Alen Blew and Dr. Lisbeth Renshaw with plans to start therapy on 11/8.  Patient's symptoms did start to improve with increase in steroids and LOS was extended to help meet her goals. She was requiring mod assist for ADL tasks and  max assist with right knee blocked for squat pivot tranfers. Family education was initiated regarding body mechanics, energy conservation as well as need for home adaptation.   On 10/26 pm patient had unresponsive episode in bathroom while defecating. Code called and she was noted to be hypotensive with SBP 60's. She was treated with IVF with improvement in blood pressure and heart rate.  EKG revealed A fib with HR in 110's. Symptoms felt to be due to vasovagal event and Dr. Starla Link was consulted for input.  Labs done revealing leucocytosis and he recommended pan culture to rule out sepsis as well as holding Cardizem. Stat CT head done revealing no significant change in residual ovoid mass in surgical bed and 4 mm hyperdense focus questioned to be focus of acute bleed.  She became unresponsive on way back form CT and Code blue initiated with ACLS protocol with successful resuscitation after 30 minutes of CPR. She was discharged to ICU on 01/25/2017  for medical management.     Disposition: ICU/Acute hospital  Diet: NPO   Discharge Medication List as of 01/10/2017  8:09 PM    START taking these medications   Details  LORazepam (ATIVAN) 0.5 MG tablet 1 tablet po 30 minutes prior to radiation or MRI, Phone In      [place] these medications which have CHANGED   Details  acetaminophen (TYLENOL) 500 MG tablet Take 2 tablets (1,000 mg total) by mouth every 8 (eight) hours as needed for headache (pain)., Starting Tue 01/11/2017, Normal    salmeterol (  SEREVENT DISKUS) 50 MCG/DOSE diskus inhaler Inhale 1 puff into the lungs 2 (two) times daily., Starting Tue 01/11/2017, No Print      CONTINUE these medications which have NOT CHANGED   Details  albuterol (PROAIR HFA) 108 (90 Base) MCG/ACT inhaler Inhale 1-2 puffs into the lungs every 4 (four) hours as needed for wheezing or shortness of breath., Historical Med    Calcium Carbonate-Vitamin D (CALCIUM-D PO) Take 1 tablet by mouth at bedtime., Historical  Med    clopidogrel (PLAVIX) 75 MG tablet Take 75 mg by mouth at bedtime. , Starting Wed 07/11/2013, Historical Med    diltiazem (CARDIZEM LA) 240 MG 24 hr tablet Take 1 tablet (240 mg total) by mouth daily., Starting Sun 07/15/2013, Print    EPINEPHrine (EPIPEN) 0.3 mg/0.3 mL SOAJ injection Inject 0.3 mg into the muscle once as needed (severe allergic reaction). , Historical Med    furosemide (LASIX) 20 MG tablet Take 20 mg by mouth daily as needed for fluid or edema., Historical Med    HYDROcodone-homatropine (HYCODAN) 5-1.5 MG/5ML syrup Take 5 mLs by mouth at bedtime as needed for cough., Historical Med    ipratropium-albuterol (DUONEB) 0.5-2.5 (3) MG/3ML SOLN Take 3 mLs by nebulization 3 (three) times daily. , Starting Wed 12/01/2016, Historical Med    levETIRAcetam (KEPPRA) 500 MG tablet Take 1 tablet (500 mg total) by mouth 2 (two) times daily., Starting Mon 01/10/2017, No Print    Multiple Vitamin (MULTIVITAMIN WITH MINERALS) TABS tablet Take 1 tablet by mouth daily., Historical Med    omeprazole (PRILOSEC) 20 MG capsule Take 20 mg by mouth 2 (two) times daily., Starting Sat 07/14/2013, Historical Med    OXYGEN Inhale 2 L into the lungs at bedtime., Historical Med    predniSONE (DELTASONE) 10 MG tablet Take 10 mg by mouth every other day., Starting Wed 07/11/2013, Historical Med    PRESCRIPTION MEDICATION Inject into the muscle See admin instructions. 2 allergy shots weekly by Dr. Fransico Michael, [place], [place], Historical Med    Vitamin D, Ergocalciferol, (DRISDOL) 50000 UNITS CAPS capsule Take 50,000 Units by mouth See admin instructions. Take 1 capsule (50,000 units) by mouth every other Saturday, Historical Med      STOP taking these medications     Chlorhexidine Gluconate Cloth 2 % PADS      mupirocin ointment (BACTROBAN) 2 %          Signed: Bary Leriche 02/02/2017, 6:28 PM

## 2017-02-03 ENCOUNTER — Ambulatory Visit (HOSPITAL_COMMUNITY): Payer: Medicare HMO | Admitting: Adult Health

## 2017-02-03 NOTE — Progress Notes (Signed)
Received pt from floor post cardiac arrest. Limited assessment completed, pt on vent, assisted MD placing central lines and starting vasopressors. Pt unresponsive without reflexes. Received multiple stat orders from pt arrival on unit to time of death.  Summary: Placed central line Fluid bolus x2 Started levophed gtt, maxed out Stated epinephrine gtt, maxed out Foley placed OG tube placed to low-int suction Started vasopressin gtt, maxed out Bicarbonate pushes x 6 Bicarbonate gtt Bicarbonate bolus x1 Started neosynephyrine gtt, maxed out Started insulin gtt for CBG > 500 Pt BP continued to drop, advised all family to come into the room.  Pt's husband made pt DNR. Pt BP continued to drop, HR decreasing steadily. 0020 RNs x2 pronouced death, see previous note.

## 2017-02-03 NOTE — Progress Notes (Signed)
Patient brought back from radiology by transport during shift report.Patient did not look right and rns assessed patient immediately Patient was  unresponsive and pulseless Code blue initiated.

## 2017-02-03 NOTE — Progress Notes (Signed)
Pt asystole, no pulses x84minutes. Pt full DNR at time of death. Verified w/ second RN, Deberah Castle, and pronounced time of death at [place]. Family at bedside. Husband, Tracy Bridges, requested all lines and ETT be removed. RT removed ETT, RN removed all peripheral IV's, central line, arterial line. Notified CDS. Family gathering in pt room now. Son, Tracy Bridges, requests that calls pertaining to funeral home transfer be directed to him, at [phone].

## 2017-02-03 NOTE — Significant Event (Signed)
Late entry:  Patient deemed stable by hospitalist as patients status was back to baseline.  Patient went for ordered CT scan and upon arrival back to our unit, RN noticed a change in her color.  Immediately assessed, no palpable pulse.  Started CPR.  Called Code Blue.  Obtained code cart and placed patient on zoll.  No shockable rhythm noted.  Code team arrived, moved patient back to room, intubated, continued CPR.  Critical Care MD transferred patient to ICU.  Brita Romp, RN

## 2017-02-03 DEATH — deceased

## 2017-02-04 ENCOUNTER — Ambulatory Visit: Payer: Medicare HMO | Admitting: Neurology

## 2017-02-04 ENCOUNTER — Telehealth: Payer: Self-pay

## 2017-02-04 NOTE — Telephone Encounter (Signed)
On 02/04/17 I received a d/c from [employer] (original). The d/c is for burial. The patient is a patient of Akram Zaaqoq. The d/c will be taken to [facility] this pm for signature.  On 02/07/2017 I received the d/c back from Doctor Halford Chessman who signed the d/c for Doctor Zaaqoq. I got the d/c ready and called the funeral home to let them know the d/c is ready for pickup.

## 2017-02-08 ENCOUNTER — Ambulatory Visit: Payer: Medicare HMO | Admitting: Radiation Oncology

## 2017-02-08 ENCOUNTER — Ambulatory Visit (HOSPITAL_COMMUNITY): Payer: Medicare HMO | Admitting: Adult Health

## 2017-02-08 ENCOUNTER — Ambulatory Visit (HOSPITAL_COMMUNITY): Payer: Medicare HMO

## 2017-02-08 ENCOUNTER — Ambulatory Visit: Payer: Medicare HMO

## 2017-02-10 ENCOUNTER — Ambulatory Visit: Payer: Medicare HMO | Admitting: Radiation Oncology

## 2017-02-15 ENCOUNTER — Encounter (HOSPITAL_COMMUNITY): Payer: Self-pay | Admitting: Pulmonary Disease

## 2017-03-05 NOTE — Death Summary Note (Signed)
SVETLANA BAGBY was a 76 y.o. female former smoker who presented to [facility] on 12/26/16 with weakness and right arm jerking.  She was found to have left frontal parietal lobe mass with vasogenic edema.  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  She had left frontal craniotomy with tumor resection on 12/31/16 and pathology was consistent with metastatic lung cancer.  She was transferred to inpatient rehab on 01/07/17.  She was found to have a DVT in her leg.  She had an IVC filter placed on 01/10/17 since she could not have anticoagulation so soon after craniotomy.  On 10/22 she was noted to have worsening right sided weakness and aphasia.  She was found to have increasing edema on CT head and she had decadron dose increased.  On 01/22/2017 she was found unresponsive in the bathroom.  Code blue called.  She had almost 30 minutes of CPR prior to ROSC.  She was intubated and transferred to ICU.  Family discussion had and decision made for DNR status.  She continued to get worse, and expired on February 27, 2017.   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

## 2019-09-16 IMAGING — CT CT HEAD W/O CM
3 series · 14 of 47 positions shown, 16 images · non-contrast
Comparison: Prior MRI from 12/30/2016.

CLINICAL DATA: Initial evaluation for generalized weakness. History
of metastatic lung cancer.

EXAM:
CT HEAD WITHOUT CONTRAST
TECHNIQUE: Contiguous axial images were obtained from the base of the skull
through the vertex without intravenous contrast.

[Series 3: head 5.0 h30s · axial · 0.44mm/px · z∈[-114,+21]mm · 8 of 33 slices shown, 10 images]
[im 3/33  brain]
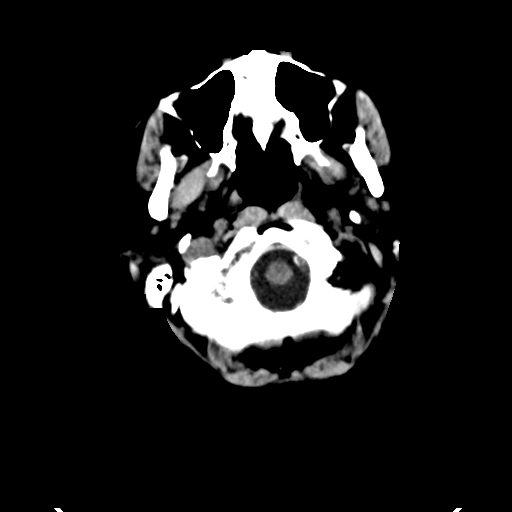
[im 3/33  bone]
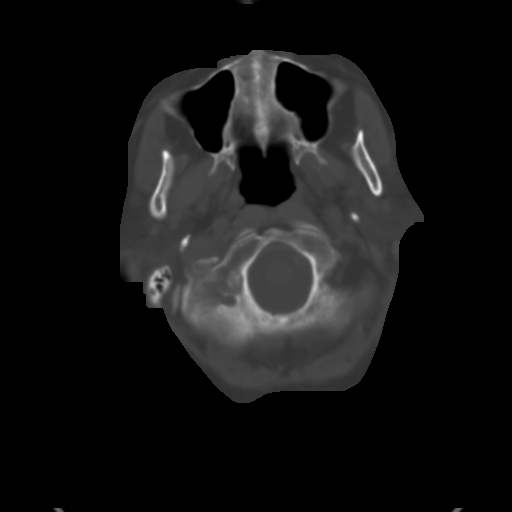
[im 7/33  brain]
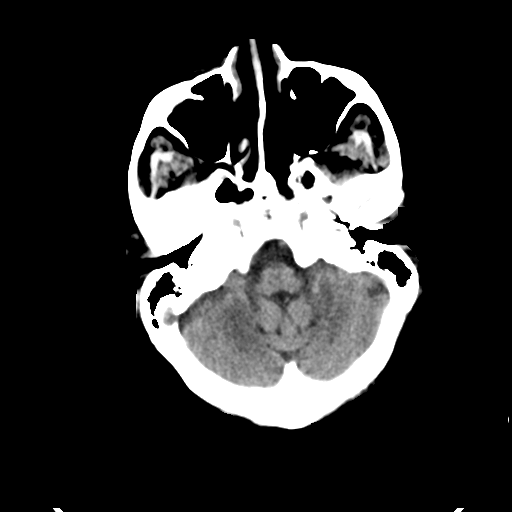
[im 10/33  brain]
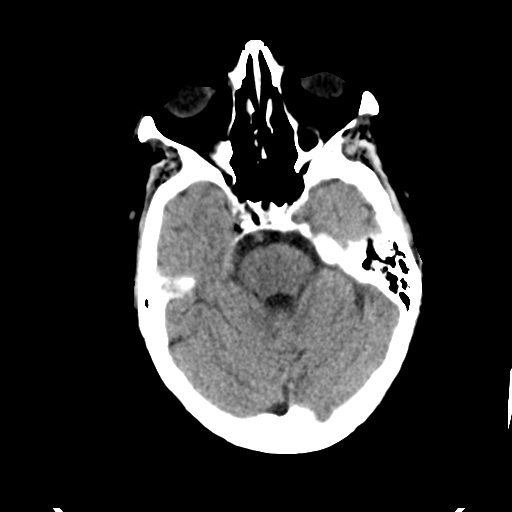
[im 15/33  brain]
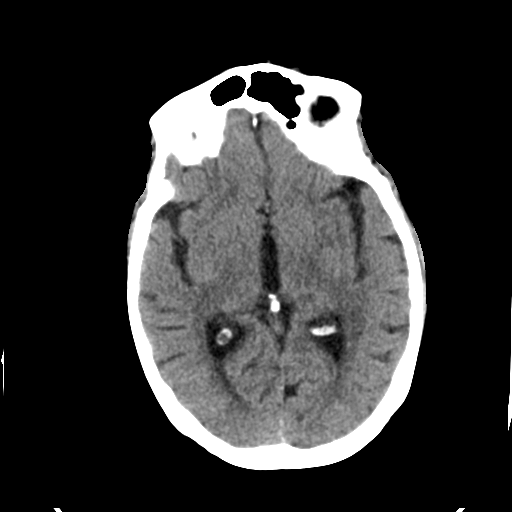
[im 18/33  brain]
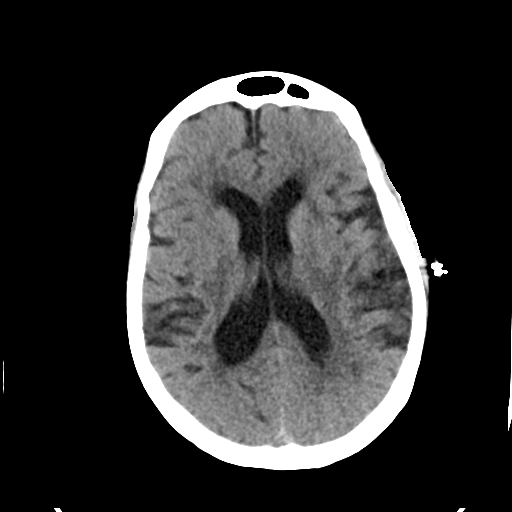
[im 18/33  bone]
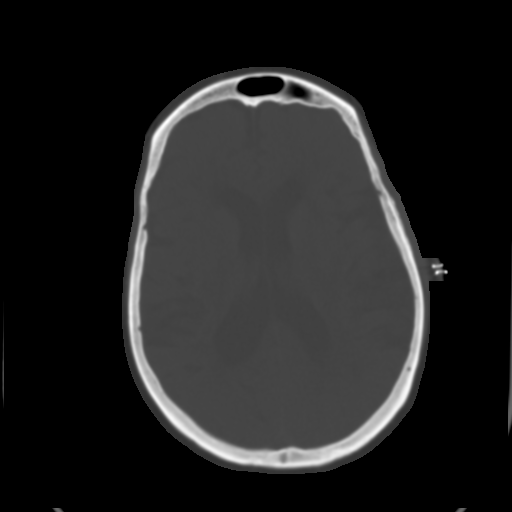
[im 23/33  brain]
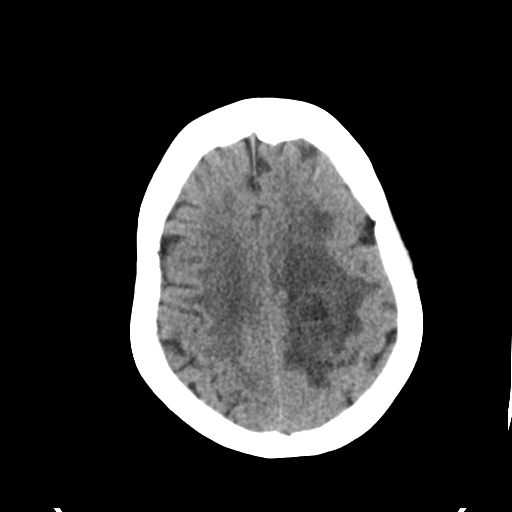
[im 26/33  brain]
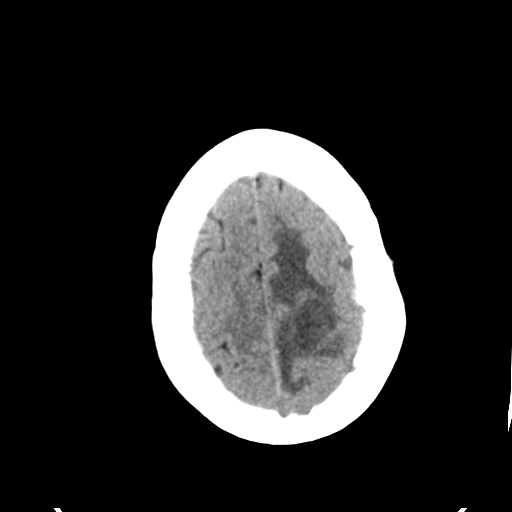
[im 30/33  brain]
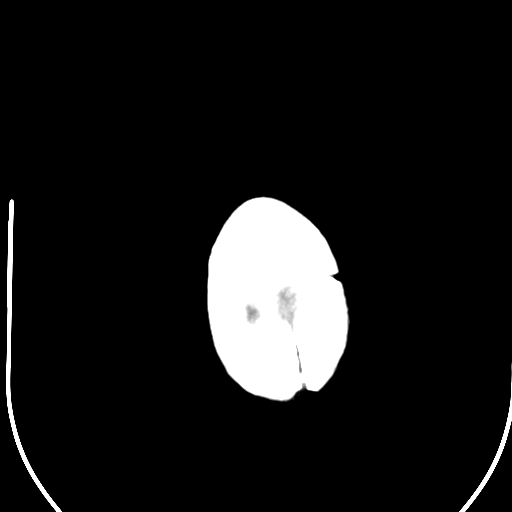

[Series 5: head 3.0 mpr cor · coronal · 0.32mm/px · 3 of 71 slices shown]
[im 24/71  brain]
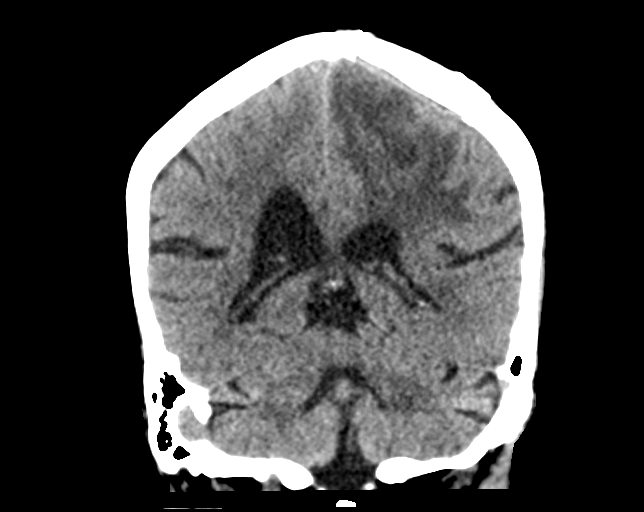
[im 32/71  brain]
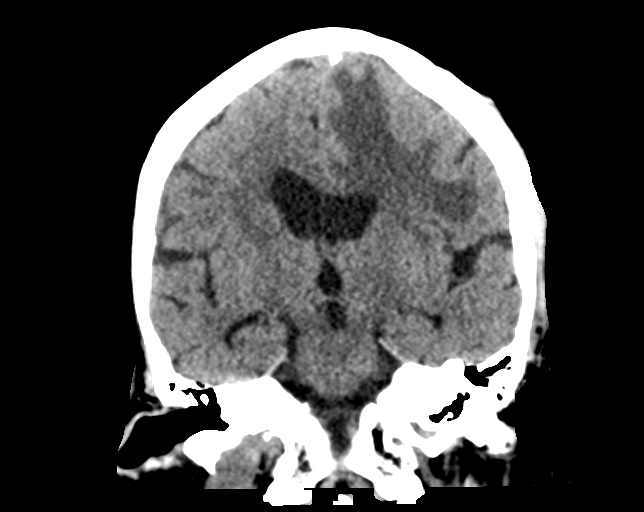
[im 39/71  brain]
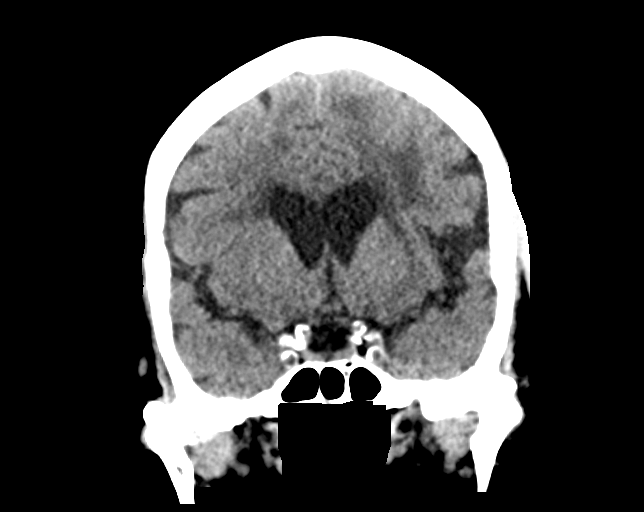

[Series 6: head 3.0 mpr sag · sagittal · 0.32mm/px · 3 of 54 slices shown]
[im 18/54  brain]
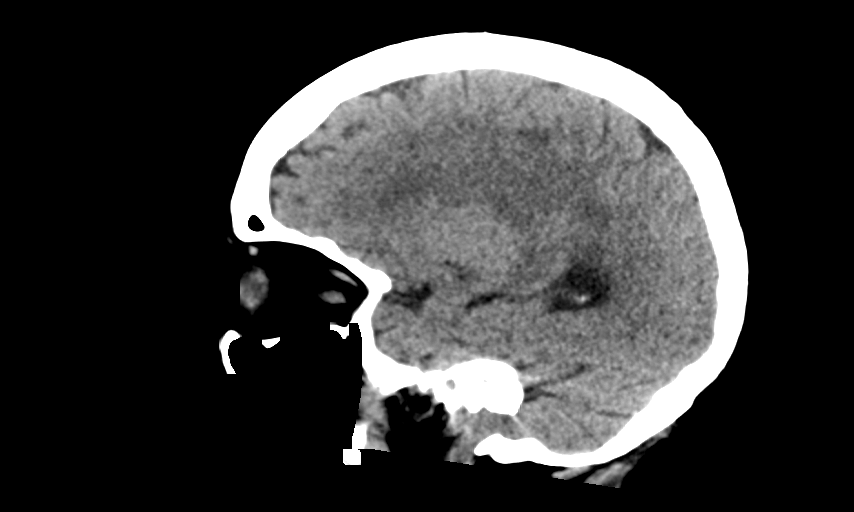
[im 27/54  brain]
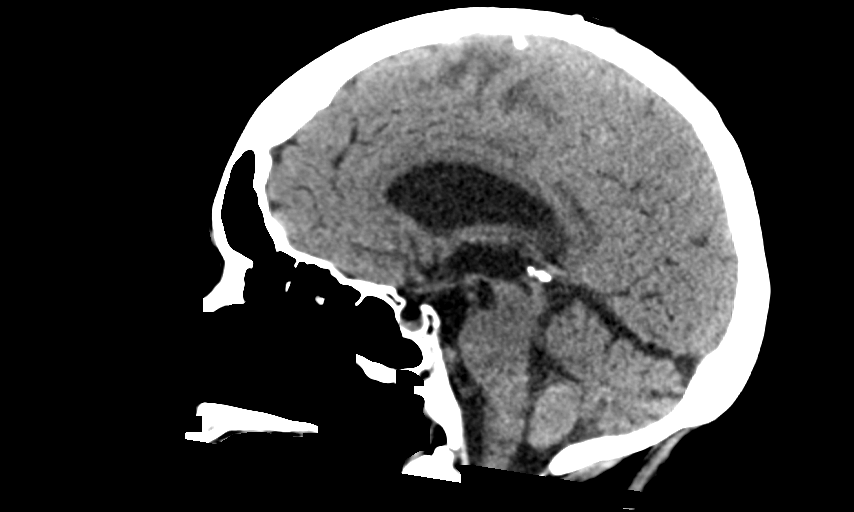
[im 36/54  brain]
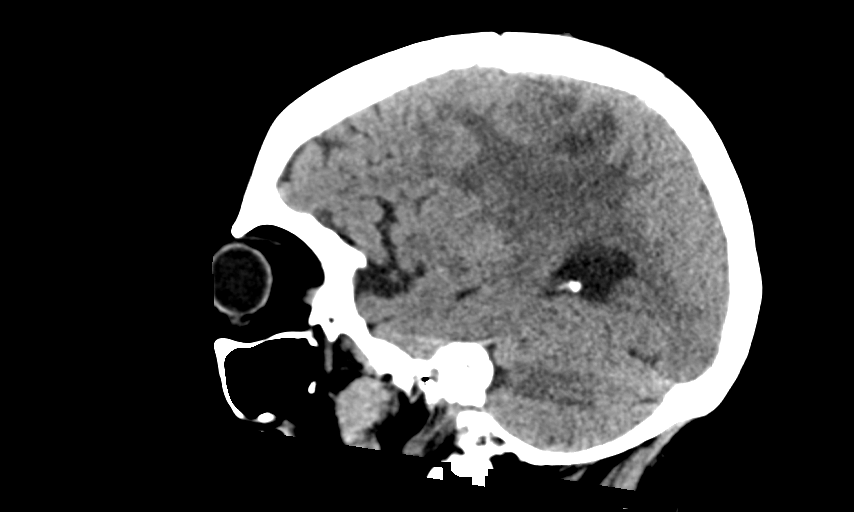

[14 of 47 positions shown; findings below may reference images not displayed]

FINDINGS: Brain: Postoperative changes from prior left parietal craniotomy for
tumor excision. Residual tumor or at the left frontal parietal
junction is present, measuring approximately 2.9 cm (series 3, image
24). Exact measurements difficult on this noncontrast examination.
Associated vasogenic edema with regional mass effect is increased
relative to previous MRI. No significant midline shift. No
hydrocephalus or ventricular trapping.

No other appreciable metastatic lesions. No acute intracranial
hemorrhage. No evidence for acute large vessel territory infarct. No
extra-axial fluid collection.

Vascular: No hyperdense vessel. Scattered vascular calcifications
noted within the carotid siphons.

Skull: Sequelae of prior craniotomy at the left parietal calvarium.
Overlying skin staples noted.

Sinuses/Orbits: Globes and orbital soft tissues within normal
limits. Paranasal sinuses are largely clear. No mastoid effusion.

Other: None.
IMPRESSION: 1. Sequelae of prior left parietal craniotomy for tumor resection.
Suspected residual tumor at the left frontoparietal junction with
worsened localized vasogenic edema as compared to previous MRI.
Associated regional mass effect without midline shift.
2. No other acute intracranial process identified.

## 2019-09-21 IMAGING — DX DG CHEST 1V PORT
1 series · 1 of 1 positions shown · non-contrast
Comparison: 07/15/2013

CLINICAL DATA: Post CPR and Code line placement

EXAM:
PORTABLE CHEST 1 VIEW

[chest ap]
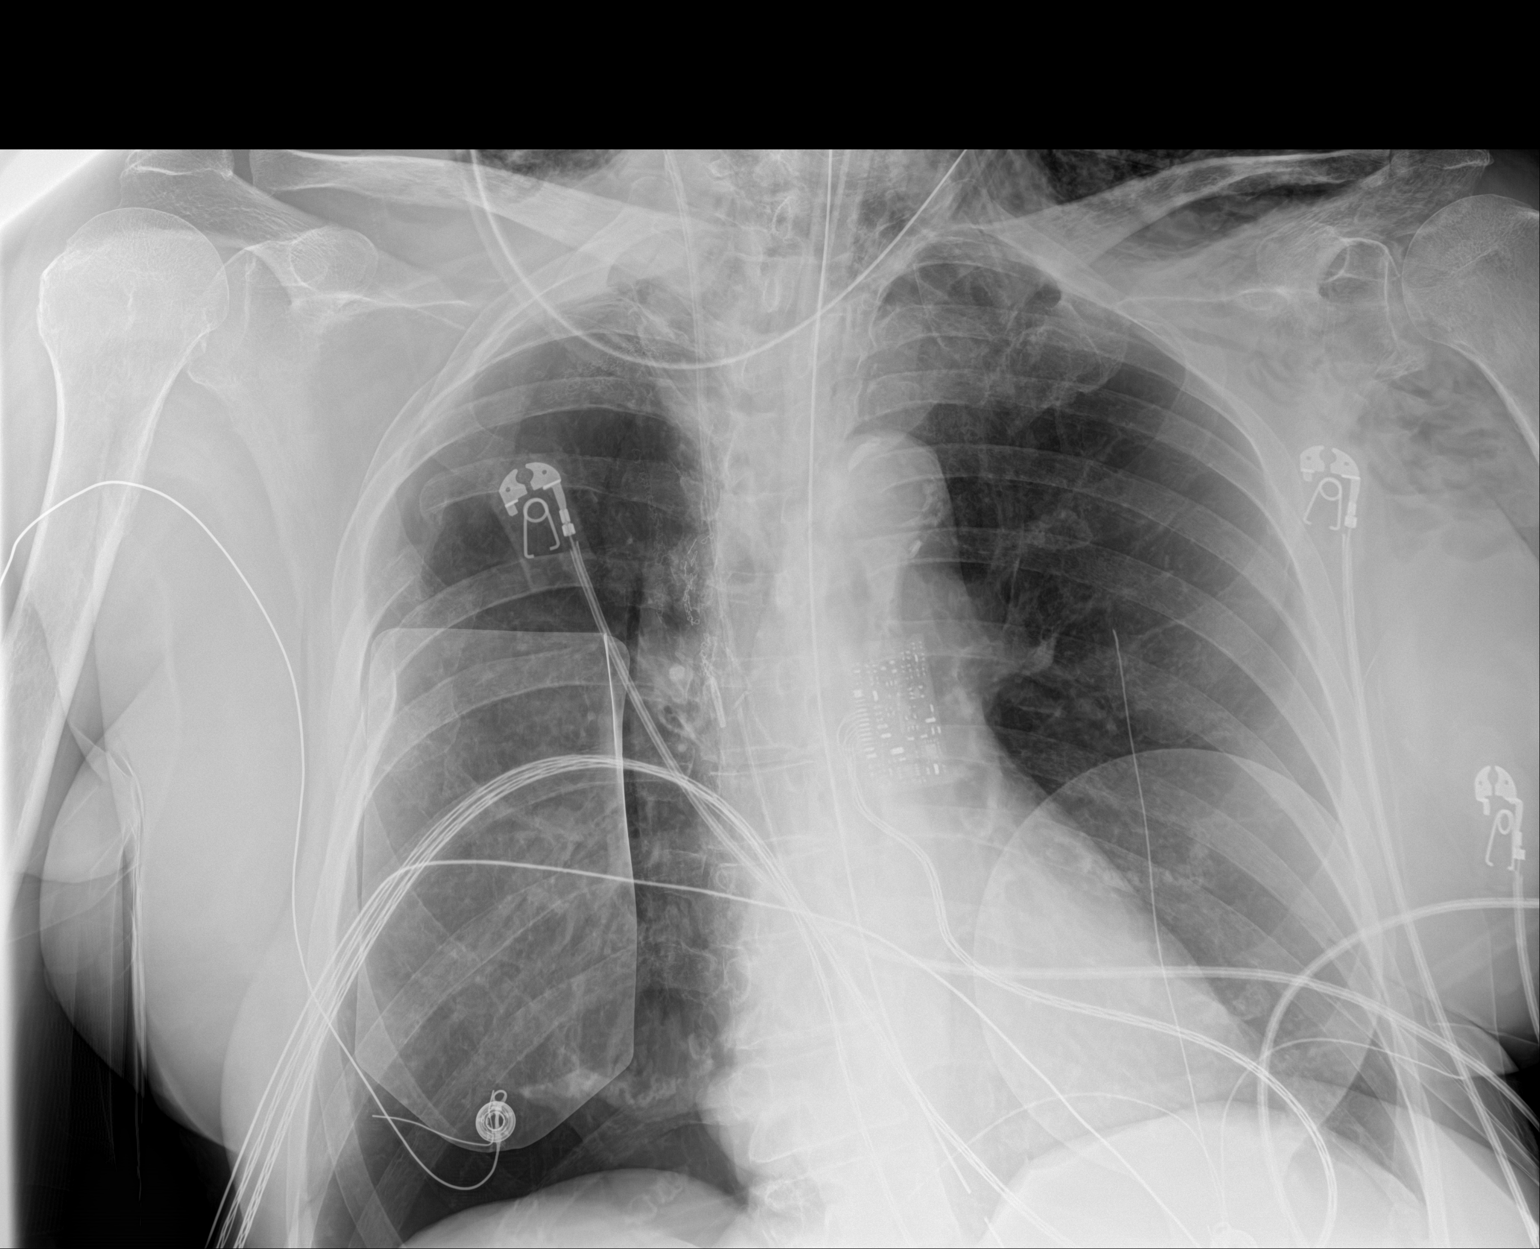

[1 of 1 positions shown; findings below may reference images not displayed]

FINDINGS: Endotracheal tube tip is about 4 cm superior to the carina.
Esophageal tube tip appears below diaphragm but is not included.
Right-sided central venous catheter tip partially obscured by
overlying leads, suspect that the tip overlies the distal SVC.
Hyperinflation. Moderate soft tissue emphysema at the neck and left
axilla.

Heart size is nonenlarged.  Aortic atherosclerosis.

There is hyper lucency at the right CP angle with possible faintly
visible pleural line at the right apex. Old right-sided rib
deformities. Postsurgical changes at the right suprahilar lung.
Possible acute left second rib fracture.
IMPRESSION: 1. Endotracheal tube tip about 4 cm superior to carina
2. Endotracheal tube tip extends below diaphragm but is not included
on the chest radiograph
3. Large amount of soft tissue emphysema at the neck,
supraclavicular regions, and left shoulder.
4. Right IJ central venous catheter tip overlies distal SVC.
Hyperlucent right lung base with possible pleural line at the right
apex, findings are concerning for a pneumothorax.
5. Possible displaced left second rib fracture.
Critical Value/emergent results were called by telephone at the time
of interpretation on 01/28/2017 at [DATE] to Dr. Alcira, who
verbally acknowledged these results.
